# Patient Record
Sex: Male | Born: 1955
Health system: Southern US, Community
[De-identification: ages and names within clinical notes are randomized; demographics above are authoritative.]

## PROBLEM LIST (undated history)

## (undated) DIAGNOSIS — N200 Calculus of kidney: Secondary | ICD-10-CM

## (undated) DIAGNOSIS — E785 Hyperlipidemia, unspecified: Secondary | ICD-10-CM

## (undated) DIAGNOSIS — Q625 Duplication of ureter: Secondary | ICD-10-CM

## (undated) DIAGNOSIS — K219 Gastro-esophageal reflux disease without esophagitis: Secondary | ICD-10-CM

## (undated) DIAGNOSIS — K5520 Angiodysplasia of colon without hemorrhage: Secondary | ICD-10-CM

## (undated) DIAGNOSIS — I219 Acute myocardial infarction, unspecified: Secondary | ICD-10-CM

## (undated) DIAGNOSIS — I10 Essential (primary) hypertension: Secondary | ICD-10-CM

## (undated) DIAGNOSIS — I2510 Atherosclerotic heart disease of native coronary artery without angina pectoris: Secondary | ICD-10-CM

## (undated) DIAGNOSIS — M519 Unspecified thoracic, thoracolumbar and lumbosacral intervertebral disc disorder: Secondary | ICD-10-CM

## (undated) DIAGNOSIS — C801 Malignant (primary) neoplasm, unspecified: Secondary | ICD-10-CM

## (undated) DIAGNOSIS — Z86010 Personal history of colonic polyps: Secondary | ICD-10-CM

## (undated) HISTORY — PX: ANGIOPLASTY: SHX39

## (undated) HISTORY — DX: Hyperlipidemia, unspecified: E78.5

## (undated) HISTORY — DX: Atherosclerotic heart disease of native coronary artery without angina pectoris: I25.10

## (undated) HISTORY — PX: KNEE ARTHROSCOPY: SUR90

## (undated) HISTORY — DX: Personal history of colonic polyps: Z86.010

## (undated) HISTORY — DX: Malignant (primary) neoplasm, unspecified: C80.1

## (undated) HISTORY — PX: BACK SURGERY: SHX140

## (undated) HISTORY — DX: Angiodysplasia of colon without hemorrhage: K55.20

## (undated) HISTORY — DX: Essential (primary) hypertension: I10

## (undated) HISTORY — DX: Gastro-esophageal reflux disease without esophagitis: K21.9

## (undated) HISTORY — DX: Calculus of kidney: N20.0

## (undated) HISTORY — DX: Duplication of ureter: Q62.5

## (undated) HISTORY — DX: Acute myocardial infarction, unspecified: I21.9

## (undated) HISTORY — PX: HAND NERVE REPAIR: SHX1728

## (undated) HISTORY — DX: Unspecified thoracic, thoracolumbar and lumbosacral intervertebral disc disorder: M51.9

---
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
## 1991-02-26 HISTORY — PX: LUMBAR DISC SURGERY: SHX700

## 2004-05-28 ENCOUNTER — Emergency Department: Payer: Self-pay | Admitting: General Practice

## 2006-02-05 ENCOUNTER — Ambulatory Visit: Payer: Self-pay | Admitting: Podiatry

## 2007-06-24 HISTORY — PX: DOPPLER ECHOCARDIOGRAPHY: SHX263

## 2010-04-25 ENCOUNTER — Ambulatory Visit: Payer: Self-pay | Admitting: Vascular Surgery

## 2010-04-28 ENCOUNTER — Observation Stay: Payer: Self-pay | Admitting: Vascular Surgery

## 2011-05-10 ENCOUNTER — Ambulatory Visit: Payer: Self-pay | Admitting: Family Medicine

## 2011-10-02 ENCOUNTER — Ambulatory Visit: Payer: Self-pay | Admitting: Internal Medicine

## 2011-10-10 ENCOUNTER — Encounter: Payer: Self-pay | Admitting: Internal Medicine

## 2011-10-10 ENCOUNTER — Ambulatory Visit (INDEPENDENT_AMBULATORY_CARE_PROVIDER_SITE_OTHER): Payer: 59 | Admitting: Internal Medicine

## 2011-10-10 VITALS — BP 128/80 | HR 63 | Temp 98.1°F | Ht 72.0 in | Wt 189.0 lb

## 2011-10-10 DIAGNOSIS — E785 Hyperlipidemia, unspecified: Secondary | ICD-10-CM | POA: Insufficient documentation

## 2011-10-10 DIAGNOSIS — M519 Unspecified thoracic, thoracolumbar and lumbosacral intervertebral disc disorder: Secondary | ICD-10-CM | POA: Insufficient documentation

## 2011-10-10 DIAGNOSIS — Z23 Encounter for immunization: Secondary | ICD-10-CM

## 2011-10-10 DIAGNOSIS — I10 Essential (primary) hypertension: Secondary | ICD-10-CM | POA: Insufficient documentation

## 2011-10-10 DIAGNOSIS — K219 Gastro-esophageal reflux disease without esophagitis: Secondary | ICD-10-CM | POA: Insufficient documentation

## 2011-10-10 NOTE — Assessment & Plan Note (Signed)
Mild and does fine with occ OTC prevacid
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
## 2011-10-10 NOTE — Assessment & Plan Note (Signed)
BP Readings from Last 3 Encounters:  10/10/11 128/80   Pretty good now Not sure he needs the low dose meds Will review meds and discuss next time Discussed fitness

## 2011-10-10 NOTE — Assessment & Plan Note (Signed)
He is not interested in primary prevention i will check records and recheck labs today

## 2011-10-10 NOTE — Patient Instructions (Addendum)
Please get last 3 years notes from [facility]

## 2011-10-10 NOTE — Assessment & Plan Note (Signed)
Mild residual nerve damage in left foot Dorsiflexion is normal though Rarely uses advil

## 2011-10-12 LAB — CBC WITH DIFFERENTIAL/PLATELET
Eos: 2 % (ref 0–7)
Eosinophils Absolute: 0.2 10*3/uL (ref 0.0–0.4)
Immature Grans (Abs): 0 10*3/uL (ref 0.0–0.1)
Immature Granulocytes: 0 % (ref 0–2)
MCH: 31.4 pg (ref 26.6–33.0)
MCV: 90 fL (ref 79–97)
Monocytes Absolute: 0.4 10*3/uL (ref 0.1–1.0)
Neutrophils Relative %: 53 % (ref 40–74)
RDW: 14.5 % (ref 12.3–15.4)
WBC: 7.7 10*3/uL (ref 4.0–10.5)

## 2011-10-12 LAB — BASIC METABOLIC PANEL
Chloride: 102 mmol/L (ref 97–108)
GFR calc Af Amer: 98 mL/min/{1.73_m2} (ref >59)
Glucose: 85 mg/dL (ref 65–99)
Potassium: 4.5 mmol/L (ref 3.5–5.2)

## 2011-10-12 LAB — LIPID PANEL
HDL: 39 mg/dL — ABNORMAL LOW (ref >39)
LDL Calculated: 146 mg/dL — ABNORMAL HIGH (ref 0–99)
VLDL Cholesterol Cal: 37 mg/dL (ref 5–40)

## 2011-10-12 LAB — HEPATIC FUNCTION PANEL: ALT: 31 IU/L (ref 0–44)

## 2011-10-12 LAB — TSH: TSH: 2.82 u[IU]/mL (ref 0.450–4.500)

## 2011-10-15 ENCOUNTER — Encounter: Payer: Self-pay | Admitting: *Deleted

## 2011-11-08 ENCOUNTER — Encounter: Payer: Self-pay | Admitting: Internal Medicine

## 2012-03-02 ENCOUNTER — Emergency Department: Payer: Self-pay | Admitting: Emergency Medicine

## 2012-03-02 LAB — CBC
HGB: 15.4 g/dL (ref 13.0–18.0)
MCH: 31.2 pg (ref 26.0–34.0)
MCHC: 34 g/dL (ref 32.0–36.0)
MCV: 92 fL (ref 80–100)

## 2012-03-02 LAB — COMPREHENSIVE METABOLIC PANEL
Albumin: 3.9 g/dL (ref 3.4–5.0)
Alkaline Phosphatase: 93 U/L (ref 50–136)
Anion Gap: 6 — ABNORMAL LOW (ref 7–16)
BUN: 13 mg/dL (ref 7–18)
Calcium, Total: 8.4 mg/dL — ABNORMAL LOW (ref 8.5–10.1)
Co2: 27 mmol/L (ref 21–32)
Creatinine: 0.89 mg/dL (ref 0.60–1.30)
EGFR (African American): >60
EGFR (Non-African Amer.): >60
Glucose: 87 mg/dL (ref 65–99)
Osmolality: 279 (ref 275–301)
SGPT (ALT): 39 U/L (ref 12–78)
Sodium: 140 mmol/L (ref 136–145)

## 2012-03-02 LAB — CK TOTAL AND CKMB (NOT AT ARMC): CK, Total: 138 U/L (ref 35–232)

## 2012-03-03 ENCOUNTER — Telehealth: Payer: Self-pay | Admitting: Internal Medicine

## 2012-03-03 ENCOUNTER — Encounter: Payer: Self-pay | Admitting: Family Medicine

## 2012-03-03 ENCOUNTER — Ambulatory Visit (INDEPENDENT_AMBULATORY_CARE_PROVIDER_SITE_OTHER): Payer: 59 | Admitting: Family Medicine

## 2012-03-03 VITALS — BP 146/74 | HR 77 | Temp 97.5°F | Ht 72.0 in | Wt 189.5 lb

## 2012-03-03 DIAGNOSIS — E785 Hyperlipidemia, unspecified: Secondary | ICD-10-CM

## 2012-03-03 DIAGNOSIS — R079 Chest pain, unspecified: Secondary | ICD-10-CM | POA: Insufficient documentation

## 2012-03-03 NOTE — Progress Notes (Signed)
Subjective:    Patient ID: Justin Terrell, male    DOB: September 16, 1955, 57 y.o.   MRN: [id number]  HPI Here for cp  Having "uneasiness" and pain in L chest over the weekend Took some asa sat-helped  Yesterday started again at 5 pm  Went to ER ([facility])- and did EKG and cxr and labs - waited too long and left without results  Chest burns with cold air  Chest is sore to the touch also and hurts to lift up his arms   Is at rest and during exertion Pain rad to his armpit L Was chilled on sat  Not sob  No nausea  Not sweating   Has a lot of stress from work- works with [employer]  Works a lot of hours  Wife had cancer this year - is doing ok now   He has also coughed a bit - and his wife has had a cold  Pulse 96% Pulse rate is nl at 77 Unsure if he is coming down with his wife's cold   No heartburn or indigestion or stomach pain    Today's EKG - NSR with rate of 66, one PAC and no acute changes  fam hx - heart valve dz in father  Personal hx of lipid/ HTN Lab Results  Component Value Date   HDL 39* 10/10/2011   LDLCALC 146* 10/10/2011   TRIG 185* 10/10/2011   CHOLHDL 5.7* 10/10/2011   intol of pravastatin Has not tried other medicines    BP: 146/74 mmHg   On lisinopril  Had nl stress echo in 09- was ok   He does have PVD in L leg - angioplasty last year - in [place]  He was unhappy with his care there  Has not had follow up for that  Patient Active Problem List  Diagnosis  . Hypertension  . Hyperlipidemia  . Lumbar disc disease  . GERD (gastroesophageal reflux disease)  . Chest pain   Past Medical History  Diagnosis Date  . Hypertension   . Hyperlipidemia   . Lumbar disc disease   . GERD (gastroesophageal reflux disease)     aspirin sensitive, prn meds only   Past Surgical History  Procedure Date  . Lumbar disc surgery 1993  . Right leg compound fracture repair 1976  . Hand nerve repair 1970's    both hands  . Knee arthroscopy 1990's    right   . Doppler  echocardiography 06/24/07    stress echo normal. Nl baseline EF   History  Substance Use Topics  . Smoking status: Former Smoker    Quit date: 02/25/2009  . Smokeless tobacco: Never Used  . Alcohol Use: No   Family History  Problem Relation Age of Onset  . Heart disease Father   . Hypertension Father   . Diabetes Neg Hx   . Cancer Neg Hx    Allergies  Allergen Reactions  . Codeine Nausea And Vomiting  . Pravastatin     Had chest pain   Current Outpatient Prescriptions on File Prior to Visit  Medication Sig Dispense Refill  . aspirin 81 MG tablet Take 81 mg by mouth daily.        Review of Systems Review of Systems  Constitutional: Negative for fever, appetite change, fatigue and unexpected weight change.  Eyes: Negative for pain and visual disturbance.  Respiratory: Negative for cough and shortness of breath.   Cardiovascular: Negative for  Palpitations, pnd, orthopnea , exertional sob    Gastrointestinal:  Negative for nausea, diarrhea and constipation. neg for abd pain or heartburn Genitourinary: Negative for urgency and frequency.  Skin: Negative for pallor or rash   MSK pos for soreness over chest worse with raising his arm, neg for back pain  Neurological: Negative for weakness, light-headedness, numbness and headaches.  Hematological: Negative for adenopathy. Does not bruise/bleed easily.  Psychiatric/Behavioral: Negative for dysphoric mood. The patient is not nervous/anxious.  has stressors        Objective:   Physical Exam  Constitutional: He appears well-developed and well-nourished. No distress.  HENT:  Head: Normocephalic and atraumatic.  Mouth/Throat: Oropharynx is clear and moist.  Eyes: Conjunctivae normal and EOM are normal. Pupils are equal, round, and reactive to light. Right eye exhibits no discharge. Left eye exhibits no discharge.  Neck: Normal range of motion. Neck supple. No JVD present. Carotid bruit is not present. No thyromegaly present.    Cardiovascular: Normal rate and regular rhythm.   Pulmonary/Chest: Effort normal and breath sounds normal. No respiratory distress. He has no wheezes. He exhibits tenderness.       Anterolateral L chest/ rib tenderness without crepitus or skin change  Abdominal: Soft. Bowel sounds are normal. He exhibits no distension, no abdominal bruit and no mass. There is no tenderness.  Musculoskeletal: He exhibits no edema and no tenderness.       No leg swelling/ heat or redness Neg homann's sign  Lymphadenopathy:    He has no cervical adenopathy.  Neurological: He is alert. He has normal reflexes. No cranial nerve deficit. He exhibits normal muscle tone. Coordination normal.  Skin: Skin is warm and dry. No rash noted. No erythema. No pallor.  Psychiatric: His affect is blunt.       Pt acts and responds to questions normally but has a very flat affect           Assessment & Plan:

## 2012-03-03 NOTE — Patient Instructions (Addendum)
Please send for [facility] ER notes and labs/ studies from yesterday/ last night - pt left the ER without finishing eval  Please put warm compress over the uncomfortable areas for 10 minutes on and off Also try aleve 1-2 pills with food up to every 12 hours  I will review records and we will get back to you  If symptoms change or suddenly worsen please call and go to the ER

## 2012-03-03 NOTE — Assessment & Plan Note (Signed)
Seems MSK on exam- with ant/lat rib tenderness on L - worse to abduct arm  Disc poss of muscle strain or costochondritis Disc symptomatic care - see instructions on AVS  Sent for records from ER to rev- since he left [facility] abruptly  Reassuring vitals/ exam and EKG today

## 2012-03-03 NOTE — Telephone Encounter (Signed)
Pt was seen

## 2012-03-03 NOTE — Assessment & Plan Note (Signed)
Pt states he had angioplasty of ? Leg last year in [place] but does not have known coronary dz Will bring up cholesterol control again with his PCP in feb

## 2012-03-03 NOTE — Telephone Encounter (Signed)
Pt on your scheduled for 11:45 today

## 2012-03-03 NOTE — Telephone Encounter (Signed)
Patient Information:  Caller Name: Lyan  Phone: [phone]  Patient: Justin Terrell, Justin Terrell  Gender: Male  DOB: 1955/11/20  Age: 57 Years  PCP: Tillman Abide [facility])  Office Follow Up:  Does the office need to follow up with this patient?: No  Instructions For The Office: N/A  RN Note:  Currently has no chest pain but is feeling uneasy.  During episodes this weekend did has some shortness of breath with some momentary dizziness.  Took 2 regular ASA during these times with pain resolving.  Went last night to [facility] where blood work and EKG was performed.  Patient left after 1.5 hours of waiting  Wife has had a cold.  Feels like left lung is burning. Has had a random cough with runny nose that is clear.  Symptoms  Reason For Call & Symptoms: Chest pain.  Had episodes on Saturday, Sunday and twice yesterday.  Went to ER and got tired waiting and left.  Reviewed Health History In EMR: Yes  Reviewed Medications In EMR: Yes  Reviewed Allergies In EMR: Yes  Reviewed Surgeries / Procedures: Yes  Date of Onset of Symptoms: 02/29/2012  Guideline(s) Used:  Chest Pain  Disposition Per Guideline:   See Today in Office  Reason For Disposition Reached:   Intermittent chest pains persist > 3 days  Advice Given:  Call Back If:  You become worse.  Appointment Scheduled:  03/03/2012 11:45:00 Appointment Scheduled Provider:  Roxy Manns (Family Practice)-Patient's PCP was full.

## 2012-03-31 ENCOUNTER — Telehealth: Payer: Self-pay | Admitting: Internal Medicine

## 2012-03-31 MED ORDER — LISINOPRIL 10 MG PO TABS: 10.0000 mg | ORAL_TABLET | Freq: Every day | ORAL | Status: DC

## 2012-03-31 NOTE — Telephone Encounter (Signed)
rx sent to pharmacy by e-script Left message on VM that rx was sent to pharmacy 

## 2012-03-31 NOTE — Telephone Encounter (Signed)
Pt sent an E-mail through the website: Justin Terrell is almost out of his blood pressure medicine -Lisinopril 10 mg. He has an apt with Dr Alphonsus Sias 04/23/12. Can he get a refill please @ [facility] on [address]? [phone] or E-mail [name]

## 2012-04-23 ENCOUNTER — Ambulatory Visit (INDEPENDENT_AMBULATORY_CARE_PROVIDER_SITE_OTHER): Payer: 59 | Admitting: Internal Medicine

## 2012-04-23 ENCOUNTER — Encounter: Payer: Self-pay | Admitting: Internal Medicine

## 2012-04-23 VITALS — BP 130/62 | HR 72 | Temp 98.1°F | Ht 72.0 in | Wt 189.0 lb

## 2012-04-23 DIAGNOSIS — Z1211 Encounter for screening for malignant neoplasm of colon: Secondary | ICD-10-CM

## 2012-04-23 DIAGNOSIS — E785 Hyperlipidemia, unspecified: Secondary | ICD-10-CM

## 2012-04-23 DIAGNOSIS — I10 Essential (primary) hypertension: Secondary | ICD-10-CM

## 2012-04-23 DIAGNOSIS — Z0000 Encounter for general adult medical examination without abnormal findings: Secondary | ICD-10-CM | POA: Insufficient documentation

## 2012-04-23 MED ORDER — LISINOPRIL 10 MG PO TABS: 10.0000 mg | ORAL_TABLET | Freq: Every day | ORAL | Status: DC

## 2012-04-23 NOTE — Assessment & Plan Note (Signed)
Doesn't want another try with meds at this point

## 2012-04-23 NOTE — Progress Notes (Signed)
Subjective:    Patient ID: Justin Terrell, male    DOB: 02-18-1956, 57 y.o.   MRN: [id number]  HPI Here for physical Chest pain went away Has had a couple of spells---gets tightness in mid chest--will gasp for air and it stops May occur lying down, not with exertion Thinks it may be stress from working a lot Hopes to take vacation soon  Has not been sleeping well Doesn't use any meds for this--melatonin not much help Tries to walk--not that often in cold weather. Daily in summer  Has noted more knee, ankle, etc aching Especially in the cold  No other concerns  Current Outpatient Prescriptions on File Prior to Visit  Medication Sig Dispense Refill  . aspirin 81 MG tablet Take 81 mg by mouth daily.       No current facility-administered medications on file prior to visit.    Allergies  Allergen Reactions  . Codeine Nausea And Vomiting  . Pravastatin     Had chest pain    Past Medical History  Diagnosis Date  . Hypertension   . Hyperlipidemia   . Lumbar disc disease   . GERD (gastroesophageal reflux disease)     aspirin sensitive, prn meds only    Past Surgical History  Procedure Laterality Date  . Lumbar disc surgery  1993  . Right leg compound fracture repair  1976  . Hand nerve repair  1970's    both hands  . Knee arthroscopy  1990's    right   . Doppler echocardiography  06/24/07    stress echo normal. Nl baseline EF    Family History  Problem Relation Age of Onset  . Heart disease Father   . Hypertension Father   . Diabetes Neg Hx   . Cancer Neg Hx     History   Social History  . Marital Status: Married    Spouse Name: N/A    Number of Children: 0  . 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 Smoking status: Former Smoker    Quit date: 02/25/2009  . Smokeless tobacco: Never Used  . Alcohol Use: No  . Drug Use: No  . Sexually Active: Not on file   Other Topics Concern   . Not on file   Social History Narrative   2nd marriage   Wife has 2 children (1 died)   Review of Systems  Constitutional: Negative for fatigue and unexpected weight change.       Wears seat belt  HENT: Negative for hearing loss, congestion, rhinorrhea, dental problem and tinnitus.        Due for the dentist  Eyes: Negative for visual disturbance.       No diplopia or unilateral vision loss  Respiratory: Negative for cough, chest tightness and shortness of breath.   Cardiovascular: Positive for chest pain. Negative for palpitations and leg swelling.  Gastrointestinal: Positive for nausea. Negative for vomiting, abdominal pain, constipation and blood in stool.       Episodic nausea with head pressure---brief No heartburn  Endocrine: Negative for cold intolerance and heat intolerance.  Genitourinary: Negative for urgency, frequency and difficulty urinating.       No sexual problems  Musculoskeletal: Positive for back pain and arthralgias. Negative for joint swelling.       Some varied aching Back pain after shovelling  Skin: Positive for rash.       Occ  head pimples--acne Bump on right occiput  Allergic/Immunologic: Negative for environmental allergies and immunocompromised state.  Neurological: Positive for headaches. Negative for dizziness, syncope, weakness, light-headedness and numbness.  Hematological: Positive for adenopathy. Does not bruise/bleed easily.       ?some right cervical nodes---generally come and go  Psychiatric/Behavioral: Positive for sleep disturbance. Negative for dysphoric mood. The patient is not nervous/anxious.        Objective:   Physical Exam  Constitutional: He is oriented to person, place, and time. He appears well-developed and well-nourished. No distress.  HENT:  Head: Normocephalic and atraumatic.  Right Ear: External ear normal.  Left Ear: External ear normal.  Mouth/Throat: Oropharynx is clear and moist.  Eyes: Conjunctivae and EOM are  normal. Pupils are equal, round, and reactive to light.  Neck: Normal range of motion. Neck supple. No thyromegaly present.  Cardiovascular: Normal rate, regular rhythm, normal heart sounds and intact distal pulses.  Exam reveals no gallop.   No murmur heard. Pulmonary/Chest: Effort normal and breath sounds normal. No respiratory distress. He has no wheezes. He has no rales.  Abdominal: Soft. Bowel sounds are normal. There is no tenderness.  Musculoskeletal: He exhibits no edema and no tenderness.  Lymphadenopathy:    He has no cervical adenopathy.  Neurological: He is alert and oriented to person, place, and time.  Skin:  Slight red area on right occiput without true lesion  Psychiatric: He has a normal mood and affect. His behavior is normal.          Assessment & Plan:

## 2012-04-23 NOTE — Assessment & Plan Note (Signed)
Fairly healthy but needs to do more exercise Will check PSA after discussion  Stool immunoassay

## 2012-04-23 NOTE — Assessment & Plan Note (Signed)
BP Readings from Last 3 Encounters:  04/23/12 130/62  03/03/12 146/74  10/10/11 128/80   Good control No changes needed

## 2012-04-24 LAB — CBC WITH DIFFERENTIAL/PLATELET
Basophils Absolute: 0 10*3/uL (ref 0.0–0.2)
Basos: 0 % (ref 0–3)
Eosinophils Absolute: 0.1 10*3/uL (ref 0.0–0.4)
HCT: 46.5 % (ref 37.5–51.0)
Hemoglobin: 15.6 g/dL (ref 12.6–17.7)
Lymphocytes Absolute: 2.3 10*3/uL (ref 0.7–3.1)
MCH: 31.3 pg (ref 26.6–33.0)
MCHC: 33.5 g/dL (ref 31.5–35.7)
MCV: 93 fL (ref 79–97)
Neutrophils Absolute: 5.5 10*3/uL (ref 1.4–7.0)
RBC: 4.98 x10E6/uL (ref 4.14–5.80)

## 2012-04-24 LAB — LIPID PANEL
HDL: 33 mg/dL — ABNORMAL LOW (ref >39)
Triglycerides: 260 mg/dL — ABNORMAL HIGH (ref 0–149)
VLDL Cholesterol Cal: 52 mg/dL — ABNORMAL HIGH (ref 5–40)

## 2012-04-24 LAB — BASIC METABOLIC PANEL
BUN/Creatinine Ratio: 15 (ref 9–20)
BUN: 14 mg/dL (ref 6–24)
Creatinine, Ser: 0.93 mg/dL (ref 0.76–1.27)
GFR calc Af Amer: 106 mL/min/{1.73_m2} (ref >59)
GFR calc non Af Amer: 91 mL/min/{1.73_m2} (ref >59)
Sodium: 141 mmol/L (ref 134–144)

## 2012-04-24 LAB — TSH: TSH: 1.47 u[IU]/mL (ref 0.450–4.500)

## 2012-04-24 LAB — HEPATIC FUNCTION PANEL
AST: 14 IU/L (ref 0–40)
Albumin: 4.3 g/dL (ref 3.5–5.5)

## 2012-04-24 LAB — PSA: PSA: 1.9 ng/mL (ref 0.0–4.0)

## 2012-04-29 ENCOUNTER — Other Ambulatory Visit (INDEPENDENT_AMBULATORY_CARE_PROVIDER_SITE_OTHER): Payer: 59

## 2012-04-29 DIAGNOSIS — Z1211 Encounter for screening for malignant neoplasm of colon: Secondary | ICD-10-CM

## 2012-04-30 ENCOUNTER — Encounter: Payer: Self-pay | Admitting: *Deleted

## 2012-04-30 LAB — FECAL OCCULT BLOOD, IMMUNOCHEMICAL: Fecal Occult Bld: NEGATIVE

## 2012-05-05 ENCOUNTER — Encounter: Payer: Self-pay | Admitting: *Deleted

## 2012-09-11 ENCOUNTER — Emergency Department: Payer: Self-pay | Admitting: Emergency Medicine

## 2012-09-11 LAB — BASIC METABOLIC PANEL
Chloride: 107 mmol/L (ref 98–107)
Co2: 28 mmol/L (ref 21–32)
Creatinine: 0.95 mg/dL (ref 0.60–1.30)
EGFR (Non-African Amer.): >60
Glucose: 102 mg/dL — ABNORMAL HIGH (ref 65–99)
Osmolality: 278 (ref 275–301)

## 2012-09-11 LAB — URINALYSIS, COMPLETE
Bilirubin,UR: NEGATIVE
Ketone: NEGATIVE
Leukocyte Esterase: NEGATIVE
Nitrite: NEGATIVE
Ph: 6 (ref 4.5–8.0)
Protein: NEGATIVE
RBC,UR: 1 /HPF (ref 0–5)
WBC UR: <1 /HPF (ref 0–5)

## 2012-09-11 LAB — HEPATIC FUNCTION PANEL A (ARMC)
Albumin: 3.9 g/dL (ref 3.4–5.0)
Bilirubin, Direct: <0.05 mg/dL (ref 0.00–0.20)
Bilirubin,Total: 0.5 mg/dL (ref 0.2–1.0)
SGPT (ALT): 33 U/L (ref 12–78)

## 2012-09-11 LAB — CBC
HGB: 15.6 g/dL (ref 13.0–18.0)
MCH: 30.8 pg (ref 26.0–34.0)
MCHC: 34 g/dL (ref 32.0–36.0)
MCV: 90 fL (ref 80–100)
RDW: 14.5 % (ref 11.5–14.5)

## 2012-09-28 ENCOUNTER — Encounter: Payer: Self-pay | Admitting: Family Medicine

## 2012-09-28 ENCOUNTER — Ambulatory Visit (INDEPENDENT_AMBULATORY_CARE_PROVIDER_SITE_OTHER): Payer: 59 | Admitting: Family Medicine

## 2012-09-28 VITALS — BP 118/78 | HR 86 | Temp 98.4°F | Wt 190.5 lb

## 2012-09-28 DIAGNOSIS — J019 Acute sinusitis, unspecified: Secondary | ICD-10-CM | POA: Insufficient documentation

## 2012-09-28 MED ORDER — HYDROCODONE-HOMATROPINE 5-1.5 MG/5ML PO SYRP: 5.0000 mL | ORAL_SOLUTION | Freq: Three times a day (TID) | ORAL | Status: DC | PRN

## 2012-09-28 MED ORDER — AMOXICILLIN-POT CLAVULANATE 875-125 MG PO TABS: 1.0000 | ORAL_TABLET | Freq: Two times a day (BID) | ORAL | Status: DC

## 2012-09-28 NOTE — Assessment & Plan Note (Signed)
Presumed, nontoxic, d/w pt re: options. Would use hycodan tonight and if not improving use augmentin, WASP rx in meantime.  He agrees.  Okay for outpatient f/u.

## 2012-09-28 NOTE — Patient Instructions (Addendum)
Use the cough medicine tonight and start the antibiotics in a few days if not improved.   Drink plenty of fluids and try to get some rest.  This should gradually improve.  Take care.  Let us know if you have other concerns.

## 2012-09-28 NOTE — Progress Notes (Signed)
5 days of sx.  Waking coughing, not sleeping. Tessalon isn't helping.  OTC meds aren't helping.  ST.  Thicker sputum in meantime. Ear ringing and facial pressure.  Seen at [facility]. Not improved in meantime. Felt hot, no documented fevers.   Meds, vitals, and allergies reviewed.   ROS: See HPI.  Otherwise, noncontributory.  GEN: nad, alert and oriented HEENT: mucous membranes moist, tm w/o erythema, nasal exam w/o erythema, clear discharge noted,  OP with cobblestoning, max sinus ttp x4 NECK: supple w/o LA CV: rrr.   PULM: ctab, no inc wob EXT: no edema SKIN: no acute rash

## 2012-10-08 ENCOUNTER — Ambulatory Visit (INDEPENDENT_AMBULATORY_CARE_PROVIDER_SITE_OTHER): Payer: 59 | Admitting: Family Medicine

## 2012-10-08 ENCOUNTER — Encounter: Payer: Self-pay | Admitting: Family Medicine

## 2012-10-08 VITALS — BP 128/84 | HR 68 | Temp 98.0°F | Wt 190.5 lb

## 2012-10-08 DIAGNOSIS — B370 Candidal stomatitis: Secondary | ICD-10-CM

## 2012-10-08 DIAGNOSIS — J019 Acute sinusitis, unspecified: Secondary | ICD-10-CM

## 2012-10-08 MED ORDER — NYSTATIN 100000 UNIT/ML MT SUSP: 500000.0000 [IU] | Freq: Four times a day (QID) | OROMUCOSAL | Status: DC

## 2012-10-08 NOTE — Progress Notes (Signed)
  Subjective:    Patient ID: Justin Terrell, male    DOB: [birth year], 57 y.o.   MRN: [id number]  HPI CC: white spots.  Seen here 09/28/2012 with dx sinusitis.  Treated with hycodan cough syrup and augmentin course.  6 days into abx, started noticing white spots on tongue and sore throat, hoarseness.    Stopped abx.  R swollen gland.  Has also noted fever blisters, used carmex salve which has helped.  Coughing up white mucous.  Dry mouth, started biotene but hasn't really helped.    Cough has continued.  Cough syrup does help some.  No fevers/chills, wheezing, chest pain, ear or tooth pain,   Wife starting to get sick as well. No smokers at home. No h/o asthma, COPD.  H/o GERD, takes PPI prn.  Past Medical History  Diagnosis Date  . Hypertension   . Hyperlipidemia   . Lumbar disc disease   . GERD (gastroesophageal reflux disease)     aspirin sensitive, prn meds only     Review of Systems Per HPI    Objective:   Physical Exam  Nursing note and vitals reviewed. Constitutional: He appears well-developed and well-nourished. No distress.  HENT:  Head: Normocephalic and atraumatic.  Right Ear: Hearing, tympanic membrane, external ear and ear canal normal.  Left Ear: Hearing, tympanic membrane, external ear and ear canal normal.  Nose: Nose normal. No mucosal edema or rhinorrhea. Right sinus exhibits no maxillary sinus tenderness and no frontal sinus tenderness. Left sinus exhibits no maxillary sinus tenderness and no frontal sinus tenderness.  Mouth/Throat: Uvula is midline and mucous membranes are normal. Posterior oropharyngeal erythema present. No oropharyngeal exudate, posterior oropharyngeal edema or tonsillar abscesses.  Light white patches on lateral tongue, tender with scraping Papules on mid tongue  Eyes: Conjunctivae and EOM are normal. Pupils are equal, round, and reactive to light. No scleral icterus.  Neck: Normal range of motion. Neck supple.  Cardiovascular: Normal  rate, regular rhythm, normal heart sounds and intact distal pulses.   No murmur heard. Pulmonary/Chest: Effort normal and breath sounds normal. No respiratory distress. He has no wheezes. He has no rales.  RLL crackles, clear with deep cough  Lymphadenopathy:    He has cervical adenopathy (R AC LAD).  Skin: Skin is warm and dry. No rash noted.       Assessment & Plan:

## 2012-10-08 NOTE — Patient Instructions (Signed)
I do think you have thrush - treat with nystatin swish and swallow 3-4 times daily. Let us know if not improving as expected.  Thrush, Adult  Justin Terrell is a yeast infection that develops in the mouth and throat and on the tongue. The medical term for this is oropharyngeal candidiasis, or OPC. Justin Terrell is most common in older adults, but it can occur at any age. Justin Terrell occurs when a yeast called candida grows out of control. Candida normally is present in small amounts in the mouth and on other mucous membranes. However, under certain circumstances, candida can grow rapidly, causing thrush. Justin Terrell can be a recurring problem for people who have chronic illnesses or who take medications that limit the body's ability to fight infection (weakened immune system). Since these people have difficulty fighting infections, the fungus that causes thrush can spread throughout the body. This can cause life-threatening blood or organ infections. CAUSES  Candida, the yeast that causes thrush, is normally present in small amounts in the mouth and on other mucous membranes. It usually causes no harm. However, when conditions are present that allow the yeast to grow uncontrolled, it invades surrounding tissues and becomes an infection. Justin Terrell is most commonly caused by the yeast Candida albicans. Less often, other forms of candida can lead to thrush. There are many types of bacteria in your mouth that normally control the growth of candida. Sometimes a new type of bacteria gets into your mouth and disrupts the balance of the germs already there. This can allow candida to overgrow. Other factors that increase your risk of developing thrush include:  An impaired ability to fight infection (weakened immune system). A normal immune system is usually strong enough to prevent candida from overgrowing.  Older adults are more likely to develop thrush because they may have weaker immune systems.  People with human immunodeficiency  virus (HIV) infection have a high likelihood of developing thrush. About 90% of people with HIV develop thrush at some point during the course of their disease.  People with diabetes are more likely to get thrush because high blood sugar levels promote overgrowth of the candida fungus.  A dry mouth (xerostomia). Dry mouth can result from overuse of mouthwashes or from certain conditions such as Sjgren's syndrome.  Pregnancy. Hormone changes during pregnancy can lead to thrush by altering the balance of bacteria in the mouth.  Poor dental care, especially in people who have false teeth.  The use of antibiotic medications. This may lead to thrush by changing the balance of bacteria in the mouth. SYMPTOMS  Thrush can be a mild infection that causes no symptoms. If symptoms develop, they may include the following:  A burning feeling in the mouth and throat. This can occur at the start of a thrush infection.  White patches that adhere to the mouth and tongue. The tissue around the patches may be red, raw, and painful. If rubbed (during tooth brushing, for example), the patches and the tissue of the mouth may bleed easily.  A bad taste in the mouth or difficulty tasting foods.  Cottony feeling in the mouth.  Sometimes pain during eating and swallowing. DIAGNOSIS  Your caregiver can usually diagnose thrush by exam. In addition to looking in your mouth, your caregiver will ask you questions about your health. TREATMENT  Medications that help prevent the growth of fungi (antifungals) are the standard treatment for thrush. These medications are either applied directly to the affected area (topical) or swallowed (oral). Mild thrush In  adults, mild cases of thrush may clear up with simple treatment that can be done at home. This treatment usually involves using an antifungal mouth rinse or lozenges. Treatment usually lasts about 14 days. Moderate to severe thrush  More severe thrush infections  that have spread to the esophagus are treated with an oral antifungal medication. A topical antifungal medication may also be used.  For some severe infections, a treatment period longer than 14 days may be needed.  Oral antifungal medications are almost never used during pregnancy because the fetus may be harmed. However, if a pregnant woman has a rare, severe thrush infection that has spread to her blood, oral antifungal medications may be used. In this case, the risk of harm to the mother and fetus from the severe thrush infection may be greater than the risk posed by the use of antifungal medications. Persistent or recurrent thrush Persistent (does not go away) or recurrent (keeps coming back) cases of thrush may:  Need to be treated twice as long as the symptoms last.  Require treatment with both oral and topical antifungal medications.  People with weakened immune systems can take an antifungal medication on a continuous basis to prevent thrush infections. It is important to treat conditions that make you more likely to get thrush, such as diabetes, human immunodeficiency virus (HIV), or cancer.  HOME CARE INSTRUCTIONS   If you are breast-feeding, you should clean your nipples with an antifungal medication, such as nystatin (Mycostatin). Dry your nipples after breast-feeding. Applying lanolin-containing body lotion may help relieve nipple soreness.  If you wear dentures and get thrush, remove dentures before going to bed, brush them vigorously, and soak in a solution of chlorhexidine gluconate or a product such as Polident or Efferdent.  Eating plain, unflavored yogurt that contains live cultures (check the label) can also help cure thrush. Yogurt helps healthy bacteria grow in the mouth. These bacteria stop the growth of the yeast that causes thrush.  Adults can treat thrush at home with gentian violet (1%), a dye that kills bacteria and fungi. It is available without a prescription.  If there is no known cause for the infection or if gentian violet does not cure the thrush, you need to see your caregiver. Comfort measures Measures can be taken to reduce the discomfort of thrush:  Drink cold liquids such as water or iced tea. Eat flavored ice treats or frozen juices.  Eat foods that are easy to swallow such as gelatin, ice cream, or custard.  If the patches are painful, try drinking from a straw.  Rinse your mouth several times a day with a warm saltwater rinse. You can make the saltwater mixture with 1 tsp (5 g) of salt in 8 fl oz (0.2 L) of warm water. PROGNOSIS   Most cases of thrush are mild and clear up with the use of an antifungal mouth rinse or lozenges. Very mild cases of thrush may clear up without medical treatment. It usually takes about 14 days of treatment with an oral antifungal medication to cure more severe thrush infections. In some cases, thrush may last several weeks even with treatment.  If thrush goes untreated and does not go away by itself, it can spread to other parts of the body.  Thrush can spread to the throat, the vagina, or the skin. It rarely spreads to other organs of the body. Justin Terrell is more likely to recur (come back) in:  People who use inhaled corticosteroids to treat asthma.  People who take antibiotic medications for a long time.  People who have false teeth.  People who have a weakened immune system. RISKS AND COMPLICATIONS Complications related to thrush are rare in healthy people. There are several factors that can increase your risk of developing thrush. Age Older adults, especially those who have serious health problems, are more likely to develop thrush because their immune systems are likely to be weaker. Behavior  The yeast that causes thrush can be spread by oral sex.  Heavy smoking can lower the body's ability to fight off infections. This makes thrush more likely to develop. Other conditions  False teeth  (dentures), braces, or a retainer that irritates the mouth make it hard to keep the mouth clean. An unclean mouth is more likely to develop thrush than a clean mouth.  People with a weakened immune system, such as those who have diabetes or human immunodeficiency virus (HIV) or who are undergoing chemotherapy, have an increased risk for developing thrush. Medications Some medications can allow the fungus that causes thrush to grow uncontrolled. Common ones are:  Antibiotics, especially those that kill a wide range of organisms (broad-spectrum antibiotics), such as tetracycline commonly can cause thrush.  Birth control pills (oral contraceptives).  Medications that weaken the body's immune system, such as corticosteroids. Environment Exposure over time to certain environmental chemicals, such as benzene and pesticides, can weaken the body's immune system. This increases your risk for developing infections, including thrush. SEEK IMMEDIATE MEDICAL CARE IF:  Your symptoms are getting worse or are not improving within 7 days of starting treatment.  You have symptoms of spreading infection, such as white patches on the skin outside of the mouth.  You are nursing and you have redness and pain in the nipples in spite of home treatment or if you have burning pain in the nipple area when you nurse. Your baby's mouth should also be examined to determine whether thrush is causing your symptoms. Document Released: 11/07/2003 Document Revised: 05/06/2011 Document Reviewed: 02/17/2008 [facility] Patient Information 2014 [place], [place].

## 2012-10-08 NOTE — Assessment & Plan Note (Signed)
Will stop abx for now, monitor for improvement. Has completed ~6d augmentin course. Continue hycodan qhs prn .

## 2012-10-08 NOTE — Assessment & Plan Note (Signed)
Anticipate thrush after antibiotic. Treat with nystatin swish/swallow. Update if sxs persist.

## 2012-10-09 ENCOUNTER — Other Ambulatory Visit: Payer: Self-pay | Admitting: Internal Medicine

## 2012-10-09 ENCOUNTER — Telehealth: Payer: Self-pay | Admitting: Internal Medicine

## 2012-10-09 DIAGNOSIS — Z0000 Encounter for general adult medical examination without abnormal findings: Secondary | ICD-10-CM

## 2012-10-09 DIAGNOSIS — Z131 Encounter for screening for diabetes mellitus: Secondary | ICD-10-CM

## 2012-10-09 NOTE — Telephone Encounter (Signed)
Pt also says he needs a1c checked for work screening, even though he is not diabetic.

## 2012-10-09 NOTE — Telephone Encounter (Signed)
Please have Terri enter it This is a urine screen but I think I ordered wrong in the past  Dx: V70.0 I guess

## 2012-10-09 NOTE — Telephone Encounter (Signed)
Patient received a health screening form at work.  Patient had his physical in February.  He'll bring by the form to be filled out.  Patient needs to have Continine checked.  Please put in an order for the test and patient said he would come on 10/16/12 to have lab done and drop off his form.

## 2012-10-16 ENCOUNTER — Telehealth: Payer: Self-pay | Admitting: Internal Medicine

## 2012-10-16 ENCOUNTER — Other Ambulatory Visit (INDEPENDENT_AMBULATORY_CARE_PROVIDER_SITE_OTHER): Payer: 59

## 2012-10-16 DIAGNOSIS — Z131 Encounter for screening for diabetes mellitus: Secondary | ICD-10-CM

## 2012-10-16 DIAGNOSIS — Z0000 Encounter for general adult medical examination without abnormal findings: Secondary | ICD-10-CM

## 2012-10-16 NOTE — Telephone Encounter (Signed)
Form signed Add the lab values when the report is in

## 2012-10-16 NOTE — Telephone Encounter (Signed)
The patient dropped off his health screening forms.  He is doing the lab work today   Thanks!

## 2012-10-16 NOTE — Telephone Encounter (Signed)
Will do!

## 2012-10-16 NOTE — Telephone Encounter (Signed)
Form on your desk  

## 2012-10-20 LAB — HEMOGLOBIN A1C: Est. average glucose Bld gHb Est-mCnc: 131 mg/dL

## 2012-10-30 ENCOUNTER — Ambulatory Visit (INDEPENDENT_AMBULATORY_CARE_PROVIDER_SITE_OTHER)
Admission: RE | Admit: 2012-10-30 | Discharge: 2012-10-30 | Disposition: A | Discharge status: Home / Self Care | Payer: 59 | Source: Ambulatory Visit | Attending: Internal Medicine | Admitting: Internal Medicine

## 2012-10-30 ENCOUNTER — Encounter: Payer: Self-pay | Admitting: Internal Medicine

## 2012-10-30 ENCOUNTER — Ambulatory Visit (INDEPENDENT_AMBULATORY_CARE_PROVIDER_SITE_OTHER): Payer: 59 | Admitting: Internal Medicine

## 2012-10-30 VITALS — BP 140/84 | HR 72 | Temp 97.8°F | Resp 20 | Wt 193.0 lb

## 2012-10-30 DIAGNOSIS — Z1211 Encounter for screening for malignant neoplasm of colon: Secondary | ICD-10-CM

## 2012-10-30 DIAGNOSIS — R059 Cough, unspecified: Secondary | ICD-10-CM

## 2012-10-30 DIAGNOSIS — R05 Cough: Secondary | ICD-10-CM

## 2012-10-30 MED ORDER — TRAMADOL HCL 50 MG PO TABS: 50.0000 mg | ORAL_TABLET | Freq: Every evening | ORAL | Status: DC | PRN

## 2012-10-30 NOTE — Assessment & Plan Note (Signed)
Still with cough though doesn't feel sick Has change in "pitch" with expiratory wheeze after coughing at night If worsens, would try doxy  Will try tramadol for cough at night

## 2012-10-30 NOTE — Progress Notes (Signed)
  Subjective:    Patient ID: Justin Terrell, male    DOB: 1955-11-15, 57 y.o.   MRN: [id number]  HPI feels that he started getting better on the augmentin Stopped due to the thrush--the topical Rx did help but still sees some white bumps in the back of his throat  Finished out the augmentin Still coughing at night--gets wheezing after. Needs to get throat lozenge or use cough syrup White or clear mucus No fever No SOB Still feels like gland is swollen in right neck  Current Outpatient Prescriptions on File Prior to Visit  Medication Sig Dispense Refill  . lisinopril (PRINIVIL,ZESTRIL) 10 MG tablet Take 1 tablet (10 mg total) by mouth daily.  90 tablet  3   No current facility-administered medications on file prior to visit.    Allergies  Allergen Reactions  . Aspirin     GIB at higher doses, tolerates 81mg  a day.   . Codeine Nausea And Vomiting  . Pravastatin     Had chest pain    Past Medical History  Diagnosis Date  . Hypertension   . Hyperlipidemia   . Lumbar disc disease   . GERD (gastroesophageal reflux disease)     aspirin sensitive, prn meds only    Past Surgical History  Procedure Laterality Date  . Lumbar disc surgery  1993  . Right leg compound fracture repair  1976  . Hand nerve repair  1970's    both hands  . Knee arthroscopy  1990's    right   . Doppler echocardiography  06/24/07    stress echo normal. Nl baseline EF    Family History  Problem Relation Age of Onset  . Heart disease Father   . Hypertension Father   . Diabetes Neg Hx   . Cancer Mother     colon cancer    History   Social History  . Marital Status: Married    Spouse Name: N/A    Number of Children: 0  . 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 Smoking status: Former Smoker    Quit date: 02/25/2009  . Smokeless tobacco: Never Used  . Alcohol Use: No  . Drug Use: No  . Sexual Activity: Not on file    Other Topics Concern  . Not on file   Social History Narrative   2nd marriage   Wife has 2 children (1 died)   Review of Systems No vomiting or diarrhea Appetite is okay noone else sick at home occ headache     Objective:   Physical Exam  Constitutional: He appears well-developed and well-nourished. No distress.  HENT:  Mouth/Throat: Oropharynx is clear and moist. No oropharyngeal exudate.  No exudates 2 bumps on back of tongue--not inflamed TMs normal ?slight left frontal tenderness Mild nasal inflammation  Neck: Normal range of motion. Neck supple.  Slight ant cervical nodes  Pulmonary/Chest: Effort normal and breath sounds normal. No respiratory distress. He has no wheezes. He has no rales.  No dullness  Lymphadenopathy:    He has cervical adenopathy.          Assessment & Plan:

## 2012-11-10 ENCOUNTER — Telehealth: Payer: Self-pay

## 2012-11-10 MED ORDER — DOXYCYCLINE MONOHYDRATE 100 MG PO CAPS: 100.0000 mg | ORAL_CAPSULE | Freq: Two times a day (BID) | ORAL | Status: DC

## 2012-11-10 NOTE — Telephone Encounter (Signed)
Pt seen 10/30/12 and non productive cough is no better, no fever. Pt request doxycycline to [facility] [address] that Dr Alphonsus Sias said he would call in if cough did not improve.Please advise.

## 2012-11-10 NOTE — Telephone Encounter (Signed)
Please send the Rx 

## 2012-11-10 NOTE — Telephone Encounter (Signed)
rx sent to pharmacy by e-script Spoke with patient and advised results   

## 2012-11-10 NOTE — Telephone Encounter (Signed)
Okay to send Rx for doxycycline monohydrate 100mg  bid #20 x 0

## 2012-11-10 NOTE — Telephone Encounter (Signed)
Pt called back and cough is now productive with white phlegm that goes from thin to thick mucus. Pt wanted Dr Alphonsus Sias to be aware to see if Doxycyline is still appropriate to [facility] [address]. Pt request cb.

## 2012-12-11 ENCOUNTER — Ambulatory Visit (AMBULATORY_SURGERY_CENTER): Payer: 59 | Admitting: *Deleted

## 2012-12-11 VITALS — Ht 72.0 in | Wt 190.8 lb

## 2012-12-11 DIAGNOSIS — Z1211 Encounter for screening for malignant neoplasm of colon: Secondary | ICD-10-CM

## 2012-12-11 MED ORDER — NA SULFATE-K SULFATE-MG SULF 17.5-3.13-1.6 GM/177ML PO SOLN: 1.0000 | Freq: Once | ORAL | Status: DC

## 2012-12-11 NOTE — Progress Notes (Signed)
No allergies to eggs or soy. No problems with anesthesia.  

## 2012-12-15 ENCOUNTER — Encounter: Payer: Self-pay | Admitting: Internal Medicine

## 2012-12-25 ENCOUNTER — Encounter: Payer: Self-pay | Admitting: Internal Medicine

## 2012-12-25 ENCOUNTER — Ambulatory Visit (AMBULATORY_SURGERY_CENTER): Payer: 59 | Admitting: Internal Medicine

## 2012-12-25 VITALS — BP 138/81 | HR 64 | Temp 96.1°F | Resp 20 | Ht 72.0 in | Wt 190.0 lb

## 2012-12-25 DIAGNOSIS — Q2733 Arteriovenous malformation of digestive system vessel: Secondary | ICD-10-CM

## 2012-12-25 DIAGNOSIS — D126 Benign neoplasm of colon, unspecified: Secondary | ICD-10-CM

## 2012-12-25 DIAGNOSIS — K5520 Angiodysplasia of colon without hemorrhage: Secondary | ICD-10-CM

## 2012-12-25 DIAGNOSIS — Z1211 Encounter for screening for malignant neoplasm of colon: Secondary | ICD-10-CM

## 2012-12-25 HISTORY — DX: Angiodysplasia of colon without hemorrhage: K55.20

## 2012-12-25 MED ORDER — SODIUM CHLORIDE 0.9 % IV SOLN: 500.0000 mL | INTRAVENOUS | Status: DC

## 2012-12-25 NOTE — Patient Instructions (Signed)
YOU HAD AN ENDOSCOPIC PROCEDURE TODAY AT THE [facility]: Refer to the procedure report that was given to you for any specific questions about what was found during the examination.  If the procedure report does not answer your questions, please call your gastroenterologist to clarify.  If you requested that your care partner not be given the details of your procedure findings, then the procedure report has been included in a sealed envelope for you to review at your convenience later.  YOU SHOULD EXPECT: Some feelings of bloating in the abdomen. Passage of more gas than usual.  Walking can help get rid of the air that was put into your GI tract during the procedure and reduce the bloating. If you had a lower endoscopy (such as a colonoscopy or flexible sigmoidoscopy) you may notice spotting of blood in your stool or on the toilet paper. If you underwent a bowel prep for your procedure, then you may not have a normal bowel movement for a few days.  DIET: Your first meal following the procedure should be a light meal and then it is ok to progress to your normal diet.  A half-sandwich or bowl of soup is an example of a good first meal.  Heavy or fried foods are harder to digest and may make you feel nauseous or bloated.  Likewise meals heavy in dairy and vegetables can cause extra gas to form and this can also increase the bloating.  Drink plenty of fluids but you should avoid alcoholic beverages for 24 hours.  ACTIVITY: Your care partner should take you home directly after the procedure.  You should plan to take it easy, moving slowly for the rest of the day.  You can resume normal activity the day after the procedure however you should NOT DRIVE or use heavy machinery for 24 hours (because of the sedation medicines used during the test).    SYMPTOMS TO REPORT IMMEDIATELY: A gastroenterologist can be reached at any hour.  During normal business hours, 8:30 AM to 5:00 PM Monday through Friday,  call [phone].  After hours and on weekends, please call the GI answering service at [phone] who will take a message and have the physician on call contact you.   Following lower endoscopy (colonoscopy or flexible sigmoidoscopy):  Excessive amounts of blood in the stool  Significant tenderness or worsening of abdominal pains  Swelling of the abdomen that is new, acute  Fever of 100F or higher  If any biopsies were taken you will be contacted by phone or by letter within the next 1-3 weeks.  Call your gastroenterologist if you have not heard about the biopsies in 3 weeks.  Our staff will call the home number listed on your records the next business day following your procedure to check on you and address any questions or concerns that you may have at that time regarding the information given to you following your procedure. This is a courtesy call and so if there is no answer at the home number and we have not heard from you through the emergency physician on call, we will assume that you have returned to your regular daily activities without incident.  SIGNATURES/CONFIDENTIALITY: You and/or your care partner have signed paperwork which will be entered into your electronic medical record.  These signatures attest to the fact that that the information above on your After Visit Summary has been reviewed and is understood.  Full responsibility of the confidentiality of this discharge information  lies with you and/or your care-partner.  Polyp information given.     Dr. Leone Payor will advise you in a letter about when you need a follow up colonoscopy based on pathology reports of polyps.  Hold aspirin, aspirin products, and anti-inflammatory meds for 2 weeks.

## 2012-12-25 NOTE — Progress Notes (Signed)
Patient did not experience any of the following events: a burn prior to discharge; a fall within the facility; wrong site/side/patient/procedure/implant event; or a hospital transfer or hospital admission upon discharge from the facility. (G8907) Patient did not have preoperative order for IV antibiotic SSI prophylaxis. (G8918)  

## 2012-12-25 NOTE — Op Note (Signed)
 [facility] [address] [place], [address]   COLONOSCOPY PROCEDURE REPORT  PATIENT: Justin Terrell, Justin Terrell  MR#: [id number] BIRTHDATE: [birth year] , 57  yrs. old GENDER: Male ENDOSCOPIST: Iva Boop, MD, [facility] REFERRED [id number] Alphonsus Sias, M.D. PROCEDURE DATE:  12/25/2012 PROCEDURE:   Colonoscopy with snare polypectomy First Screening Colonoscopy - Avg.  risk and is 50 yrs.  old or older Yes.  Prior Negative Screening - Now for repeat screening. N/A  History of Adenoma - Now for follow-up colonoscopy & has been > or = to 3 yrs.  N/A  Polyps Removed Today? Yes. ASA CLASS:   Class II INDICATIONS:average risk screening. MEDICATIONS: propofol (Diprivan) 200mg  IV, MAC sedation, administered by CRNA, and These medications were titrated to patient response per physician's verbal order  DESCRIPTION OF PROCEDURE:   After the risks benefits and alternatives of the procedure were thoroughly explained, informed consent was obtained.  A digital rectal exam revealed no abnormalities of the rectum, A digital rectal exam revealed no prostatic nodules, and A digital rectal exam revealed the prostate was not enlarged.   The LB [id number]  endoscope was introduced through the anus and advanced to the cecum, which was identified by both the appendix and ileocecal valve. No adverse events experienced.   The quality of the prep was excellent using Suprep  The instrument was then slowly withdrawn as the colon was fully examined.  COLON FINDINGS: Two semi-pedunculated polyps measuring 6 and 8 mm in size were found in the sigmoid colon and rectum.  A polypectomy was performed using snare cautery.  The resection was complete and the polyp tissue was completely retrieved.   Medium sized non-bleeding AVM (7mm) was found in the ascending colon.   The colon mucosa was otherwise normal.   A right colon retroflexion was performed. Retroflexed views revealed no abnormalities. The time to  cecum=1 minutes 51 seconds.  Withdrawal time=12 minutes 13 seconds.  The scope was withdrawn and the procedure completed. COMPLICATIONS: There were no complications.  ENDOSCOPIC IMPRESSION: 1.   Two semi-pedunculated polyps measuring 6 and 8 mm in size were found in the sigmoid colon and rectum; polypectomy was performed using snare cautery 2.     7 mm Cecal AVM 3.   The colon mucosa was otherwise normal - excellent prep - first colonoscopy  RECOMMENDATIONS: 1.  Timing of repeat colonoscopy will be determined by pathology findings. 2.  Hold aspirin, aspirin products, and anti-inflammatory medication for 2 weeks.  eSigned:  Iva Boop, MD, [facility] 12/25/2012 8:27 AM  cc: Karie Schwalbe, MD and The Patient

## 2012-12-25 NOTE — Progress Notes (Signed)
Pt stable to RR 

## 2012-12-25 NOTE — Progress Notes (Signed)
Called to room to assist during endoscopic procedure.  Patient ID and intended procedure confirmed with present staff. Received instructions for my participation in the procedure from the performing physician.  

## 2012-12-28 ENCOUNTER — Telehealth: Payer: Self-pay | Admitting: *Deleted

## 2012-12-28 NOTE — Telephone Encounter (Signed)
No answer, left message to cal if questions or concerns.

## 2012-12-30 ENCOUNTER — Encounter: Payer: Self-pay | Admitting: Internal Medicine

## 2012-12-30 DIAGNOSIS — Z860100 Personal history of colon polyps, unspecified: Secondary | ICD-10-CM

## 2012-12-30 HISTORY — DX: Personal history of colonic polyps: Z86.010

## 2012-12-30 HISTORY — DX: Personal history of colon polyps, unspecified: Z86.0100

## 2012-12-30 NOTE — Progress Notes (Signed)
Quick Note:  1 adenoma and 1 hyperplastic polyp - max 8 mm Repeat colonoscopy 2019 ______

## 2013-04-30 ENCOUNTER — Encounter: Payer: Self-pay | Admitting: Internal Medicine

## 2013-04-30 ENCOUNTER — Ambulatory Visit (INDEPENDENT_AMBULATORY_CARE_PROVIDER_SITE_OTHER): Payer: 59 | Admitting: Internal Medicine

## 2013-04-30 VITALS — BP 140/80 | HR 66 | Temp 98.5°F | Ht 72.0 in | Wt 188.0 lb

## 2013-04-30 DIAGNOSIS — I10 Essential (primary) hypertension: Secondary | ICD-10-CM

## 2013-04-30 DIAGNOSIS — Z0000 Encounter for general adult medical examination without abnormal findings: Secondary | ICD-10-CM

## 2013-04-30 DIAGNOSIS — E785 Hyperlipidemia, unspecified: Secondary | ICD-10-CM

## 2013-04-30 MED ORDER — LISINOPRIL 10 MG PO TABS: 10.0000 mg | ORAL_TABLET | Freq: Every day | ORAL | Status: DC

## 2013-04-30 NOTE — Assessment & Plan Note (Signed)
BP Readings from Last 3 Encounters:  04/30/13 140/80  12/25/12 138/81  10/30/12 140/84   Good control No changes needed

## 2013-04-30 NOTE — Progress Notes (Signed)
Pre visit review using our clinic review tool, if applicable. No additional management support is needed unless otherwise documented below in the visit note. 

## 2013-04-30 NOTE — Assessment & Plan Note (Signed)
Discussed primary prevention He prefers to hold off on statins Rx for labs---he just had [place]

## 2013-04-30 NOTE — Assessment & Plan Note (Signed)
Healthy Discussed weight training for his arthritis issues PSA deferred till next year

## 2013-04-30 NOTE — Progress Notes (Signed)
Subjective:    Patient ID: Justin Terrell, male    DOB: March 09, 1955, 58 y.o.   MRN: [id number]  HPI Here for physical Some aches and pain Shoulder and right knee chronic issues--though sporadic. Left shoulder pops since fractured clavicle Hasn't used meds Feels back occasionally --not that bad Walks regularly, tries to eat right  Otherwise feels good No job or FH changes Mom is doing better from her rectal cancer  Current Outpatient Prescriptions on File Prior to Visit  Medication Sig Dispense Refill  . aspirin 81 MG tablet Take 81 mg by mouth daily.       No current facility-administered medications on file prior to visit.    Allergies  Allergen Reactions  . Aspirin     GIB at higher doses, tolerates 81mg  a day.   . Codeine Nausea And Vomiting  . Pravastatin     Had chest pain    Past Medical History  Diagnosis Date  . Hypertension   . Hyperlipidemia   . Lumbar disc disease   . GERD (gastroesophageal reflux disease)     aspirin sensitive, prn meds only  . AVM (arteriovenous malformation) of ascending colon 12/25/2012  . Personal history of colonic adenoma 12/30/2012    Past Surgical History  Procedure Laterality Date  . Lumbar disc surgery  1993  . Right leg compound fracture repair  1976  . Hand nerve repair  1970's    both hands  . Knee arthroscopy  1990's    right   . Doppler echocardiography  06/24/07    stress echo normal. Nl baseline EF    Family History  Problem Relation Age of Onset  . Heart disease Father   . Hypertension Father   . Diabetes Neg Hx   . Cancer Mother     colon cancer  . Colon cancer Mother 99    History   Social History  . Marital Status: Married    Spouse Name: N/A    Number of Children: 0  . 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 Smoking status: Former Smoker    Quit date: 02/25/2009  . Smokeless tobacco: Never Used  . Alcohol Use: No  .  Drug Use: No  . Sexual Activity: Not on file   Other Topics Concern  . Not on file   Social History Narrative   2nd marriage   Wife has 2 children (1 died)   Review of Systems  Constitutional: Negative for fatigue and unexpected weight change.       Wears seat belt  HENT: Negative for dental problem, hearing loss and tinnitus.        Overdue for the dentist Mouth is dry at night  Eyes:       No diplopia or unilateral vision loss  Respiratory: Negative for cough, chest tightness and shortness of breath.   Cardiovascular: Negative for chest pain, palpitations and leg swelling.  Gastrointestinal: Positive for nausea. Negative for vomiting, abdominal pain, constipation and blood in stool.       Occasional nausea-- better if he eats No heartburn  Endocrine: Negative for cold intolerance and heat intolerance.  Genitourinary: Positive for difficulty urinating.       Occasional interrupted flow No nocturia No sexual problems  Musculoskeletal: Positive for arthralgias and back pain. Negative for joint swelling.  Skin: Negative for rash.       Dry skin No suspicious  lesions  Allergic/Immunologic: Negative for environmental allergies and immunocompromised state.  Neurological: Positive for headaches. Negative for dizziness, syncope, weakness, light-headedness and numbness.       Rare sinus headaches--no meds  Hematological: Negative for adenopathy. Does not bruise/bleed easily.  Psychiatric/Behavioral: Negative for sleep disturbance and dysphoric mood. The patient is not nervous/anxious.        Objective:   Physical Exam  Constitutional: He is oriented to person, place, and time. He appears well-developed and well-nourished. No distress.  HENT:  Head: Normocephalic and atraumatic.  Right Ear: External ear normal.  Left Ear: External ear normal.  Mouth/Throat: Oropharynx is clear and moist. No oropharyngeal exudate.  Eyes: Conjunctivae and EOM are normal. Pupils are equal, round,  and reactive to light.  Neck: Normal range of motion. Neck supple. No thyromegaly present.  Cardiovascular: Normal rate, regular rhythm, normal heart sounds and intact distal pulses.  Exam reveals no gallop.   No murmur heard. Pulmonary/Chest: Effort normal and breath sounds normal. No respiratory distress. He has no wheezes. He has no rales.  Abdominal: Soft. He exhibits no distension. There is no tenderness. There is no rebound and no guarding.  Musculoskeletal: He exhibits no edema and no tenderness.  Slight popping at left acromioclavicular joint---ROM is good though Right knee --no meniscus or ligament findings  Lymphadenopathy:    He has no cervical adenopathy.  Neurological: He is alert and oriented to person, place, and time.  Skin: No erythema.  Psychiatric: He has a normal mood and affect. His behavior is normal.          Assessment & Plan:

## 2013-05-01 ENCOUNTER — Telehealth: Payer: Self-pay | Admitting: Internal Medicine

## 2013-05-01 NOTE — Telephone Encounter (Signed)
Relevant patient education assigned to patient using Emmi. ° °

## 2013-05-03 ENCOUNTER — Telehealth: Payer: Self-pay | Admitting: Internal Medicine

## 2013-05-03 NOTE — Telephone Encounter (Signed)
Relevant patient education assigned to patient using Emmi. ° °

## 2013-05-24 ENCOUNTER — Telehealth: Payer: Self-pay

## 2013-05-24 DIAGNOSIS — M25519 Pain in unspecified shoulder: Secondary | ICD-10-CM

## 2013-05-24 NOTE — Telephone Encounter (Signed)
Pt left v/m; pt was seen 04/30/13;pt's left shoulder and wrist pain is no better;over weekend pt had some oxycodone and that did not help shoulder and wrist pain, pt has previously tried aleve and tylenol.pain level now is 6. Mobility of shoulder is limited; pt having problems moving the steering wheel of the car. Pt request referral to orthopedist in [place].

## 2013-05-27 ENCOUNTER — Telehealth: Payer: Self-pay

## 2013-05-27 NOTE — Telephone Encounter (Signed)
Rheumatology panel is remarkably vague and could include dozens of tests. I have written some basic tests to add to the regular blood work as a starting point Please fax to [facility] where ever he is getting the blood draw

## 2013-05-27 NOTE — Telephone Encounter (Signed)
.  left message to have patient return my call.  

## 2013-05-27 NOTE — Telephone Encounter (Signed)
Pt is [employer] employee and pt saw orthopedist in [place] and ortho wants rheumatology panel; pt said simpler if Dr Silvio Pate will add order of rheumatology panel to the order he has already writtten for pt to have labs at [employer] [facility] [place] [facility]; pt plans to go to [place] tomorrow 05/28/13.pt request cb. I advised pt to ck with orthopedist in [place] for order but pt request me to send note to Dr Silvio Pate.

## 2013-05-27 NOTE — Telephone Encounter (Signed)
Pt called back and I faxed to pt

## 2013-05-31 ENCOUNTER — Encounter: Payer: Self-pay | Admitting: Internal Medicine

## 2013-05-31 NOTE — Telephone Encounter (Signed)
Pt left vm requesting cb with lab results that were drawn on 05/28/13. Pt request cb [id number].

## 2013-06-01 ENCOUNTER — Encounter: Payer: Self-pay | Admitting: *Deleted

## 2013-06-01 NOTE — Telephone Encounter (Signed)
Pt seen results on mychart, I left message on machine to have him call back with number of his ortho dr.

## 2013-06-01 NOTE — Telephone Encounter (Signed)
Referral complete and records faxed.

## 2013-06-06 ENCOUNTER — Emergency Department: Payer: Self-pay | Admitting: Emergency Medicine

## 2013-06-06 LAB — BASIC METABOLIC PANEL
ANION GAP: 7 (ref 7–16)
BUN: 12 mg/dL (ref 7–18)
CALCIUM: 8.4 mg/dL — AB (ref 8.5–10.1)
Chloride: 107 mmol/L (ref 98–107)
Co2: 26 mmol/L (ref 21–32)
Creatinine: 0.77 mg/dL (ref 0.60–1.30)
EGFR (African American): >60
Glucose: 116 mg/dL — ABNORMAL HIGH (ref 65–99)
OSMOLALITY: 280 (ref 275–301)
POTASSIUM: 3.8 mmol/L (ref 3.5–5.1)
SODIUM: 140 mmol/L (ref 136–145)

## 2013-06-06 LAB — CBC
HCT: 46.4 % (ref 40.0–52.0)
HGB: 15.2 g/dL (ref 13.0–18.0)
MCH: 30.9 pg (ref 26.0–34.0)
MCHC: 32.8 g/dL (ref 32.0–36.0)
MCV: 94 fL (ref 80–100)
Platelet: 232 10*3/uL (ref 150–440)
RBC: 4.93 10*6/uL (ref 4.40–5.90)
RDW: 14.3 % (ref 11.5–14.5)
WBC: 9.3 10*3/uL (ref 3.8–10.6)

## 2013-06-06 LAB — TSH: Thyroid Stimulating Horm: 1.19 u[IU]/mL

## 2013-06-06 LAB — TROPONIN I: Troponin-I: <0.02 ng/mL

## 2013-06-06 LAB — CK-MB: CK-MB: 0.8 ng/mL (ref 0.5–3.6)

## 2013-06-06 LAB — CK: CK, Total: 93 U/L

## 2013-10-18 ENCOUNTER — Telehealth: Payer: Self-pay | Admitting: Internal Medicine

## 2013-10-18 NOTE — Telephone Encounter (Signed)
Had cholesterol and sugar done in April Will need serum cotinine done ([facility]) if that is on there--please send him to lab for this BMI is in chart

## 2013-10-18 NOTE — Telephone Encounter (Signed)
Pt calling in regards to a [employer] form that he needs filled out before 10/25/2013. Pt says form requires lab work, BMI and a nicotine test? Pt says he will bring form by to drop off to be filled out. Thank you

## 2013-10-19 NOTE — Telephone Encounter (Signed)
Spoke with patient he will bring the form by and get the nicotine test at the same time.

## 2014-05-03 ENCOUNTER — Encounter: Payer: 59 | Admitting: Internal Medicine

## 2014-05-10 ENCOUNTER — Ambulatory Visit (INDEPENDENT_AMBULATORY_CARE_PROVIDER_SITE_OTHER): Payer: 59 | Admitting: Internal Medicine

## 2014-05-10 ENCOUNTER — Other Ambulatory Visit: Payer: Self-pay | Admitting: Internal Medicine

## 2014-05-10 ENCOUNTER — Encounter: Payer: Self-pay | Admitting: Internal Medicine

## 2014-05-10 VITALS — BP 140/80 | HR 66 | Temp 97.8°F | Ht 72.0 in | Wt 187.0 lb

## 2014-05-10 DIAGNOSIS — E785 Hyperlipidemia, unspecified: Secondary | ICD-10-CM

## 2014-05-10 DIAGNOSIS — I10 Essential (primary) hypertension: Secondary | ICD-10-CM

## 2014-05-10 DIAGNOSIS — Z0000 Encounter for general adult medical examination without abnormal findings: Secondary | ICD-10-CM

## 2014-05-10 NOTE — Assessment & Plan Note (Signed)
BP Readings from Last 3 Encounters:  05/10/14 140/80  04/30/13 140/80  12/25/12 138/81   Reasonable control Due for labs

## 2014-05-10 NOTE — Progress Notes (Signed)
Subjective:    Patient ID: Justin Terrell, male    DOB: 1956-02-14, 59 y.o.   MRN: [id number]  HPI Here for physical  Did see ortho --[facility]-- last year Got cortisone shots in shoulder and wrist Probably related to 100# dog on leash---and she will lunge  Wasn't arthritis--- has built up muscles and got better collar to restrain dog somewhat  Will have occasional right frontal headache-- ?stress related Doesn't check blood pressure  Discussed cholesterol He is not really interested in taking meds  Nothing new in [place] Mom has done well since colon cancer  Current Outpatient Prescriptions on File Prior to Visit  Medication Sig Dispense Refill  . aspirin 81 MG tablet Take 81 mg by mouth daily.     No current facility-administered medications on file prior to visit.    Allergies  Allergen Reactions  . Aspirin     GIB at higher doses, tolerates 81mg  a day.   . Codeine Nausea And Vomiting  . Pravastatin     Had chest pain    Past Medical History  Diagnosis Date  . Hypertension   . Hyperlipidemia   . Lumbar disc disease   . GERD (gastroesophageal reflux disease)     aspirin sensitive, prn meds only  . AVM (arteriovenous malformation) of ascending colon 12/25/2012  . Personal history of colonic adenoma 12/30/2012    Past Surgical History  Procedure Laterality Date  . Lumbar disc surgery  1993  . Right leg compound fracture repair  1976  . Hand nerve repair  1970's    both hands  . Knee arthroscopy  1990's    right   . Doppler echocardiography  06/24/07    stress echo normal. Nl baseline EF    Family History  Problem Relation Age of Onset  . Heart disease Father   . Hypertension Father   . Diabetes Neg Hx   . Cancer Mother     colon cancer  . Colon cancer Mother 75    History   Social History  . Marital Status: Married    Spouse Name: N/A  . Number of Children: 0  . 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 Smoking status: Former Smoker    Quit date: 02/25/2009  . Smokeless tobacco: Never Used  . Alcohol Use: No  . Drug Use: No  . Sexual Activity: Not on file   Other Topics Concern  . Not on file   Social History Narrative   2nd marriage   Wife has 2 children (1 died)   Review of Systems  Constitutional: Negative for fatigue and unexpected weight change.       Wears seat belt  HENT: Negative for dental problem, hearing loss and tinnitus.        Overdue for dentist  Eyes: Negative for visual disturbance.       No diplopia or unilateral vision loss  Respiratory: Negative for cough, chest tightness and shortness of breath.   Cardiovascular: Positive for chest pain. Negative for palpitations and leg swelling.       Has some pulling on left chest if he leans over --recently Tries to walk regularly (with dog)--but very short distance. Counseled to increase  Gastrointestinal: Positive for diarrhea. Negative for nausea, vomiting, abdominal pain and blood in stool.       Brief diarrhea with spicy food  Endocrine: Negative for polydipsia and polyuria.  Genitourinary: Negative for urgency, frequency and difficulty urinating.       No sexual problems  Musculoskeletal: Positive for back pain. Negative for joint swelling and arthralgias.       Occ back pain and left leg numbness  Skin: Positive for rash.       Some acne on head--when he eats chocolate  Allergic/Immunologic: Negative for environmental allergies and immunocompromised state.  Neurological: Positive for dizziness, numbness and headaches. Negative for syncope and weakness.       Occ dizziness if he reclines his head back in chair  Hematological: Negative for adenopathy. Does not bruise/bleed easily.  Psychiatric/Behavioral: Negative for sleep disturbance and dysphoric mood. The patient is not nervous/anxious.        Objective:   Physical Exam  Constitutional: He is oriented to person, place,  and time. He appears well-developed and well-nourished. No distress.  HENT:  Head: Normocephalic and atraumatic.  Right Ear: External ear normal.  Left Ear: External ear normal.  Mouth/Throat: Oropharynx is clear and moist.  Eyes: Conjunctivae and EOM are normal. Pupils are equal, round, and reactive to light.  Neck: Normal range of motion. Neck supple. No thyromegaly present.  Cardiovascular: Normal rate, regular rhythm, normal heart sounds and intact distal pulses.  Exam reveals no gallop.   No murmur heard. Pulmonary/Chest: Effort normal and breath sounds normal. No respiratory distress. He has no wheezes. He has no rales.  Abdominal: Soft. There is no tenderness.  Musculoskeletal: He exhibits no edema or tenderness.  Lymphadenopathy:    He has no cervical adenopathy.  Neurological: He is alert and oriented to person, place, and time.  Skin: No rash noted. No erythema.  Psychiatric: He has a normal mood and affect. His behavior is normal.          Assessment & Plan:

## 2014-05-10 NOTE — Assessment & Plan Note (Signed)
Healthy Discussed increasing exercise PSA today --after discussion Colonoscopy due 2019

## 2014-05-10 NOTE — Progress Notes (Signed)
Pre visit review using our clinic review tool, if applicable. No additional management support is needed unless otherwise documented below in the visit note. 

## 2014-05-10 NOTE — Assessment & Plan Note (Signed)
Discussed meds---doesn't want statin for primary prevention at this time

## 2014-05-11 LAB — CBC WITH DIFFERENTIAL/PLATELET
Basophils Absolute: 0 10*3/uL (ref 0.0–0.2)
Basos: 1 %
EOS: 2 %
Eosinophils Absolute: 0.1 10*3/uL (ref 0.0–0.4)
HCT: 45.6 % (ref 37.5–51.0)
Hemoglobin: 15.7 g/dL (ref 12.6–17.7)
IMMATURE GRANULOCYTES: 0 %
Immature Grans (Abs): 0 10*3/uL (ref 0.0–0.1)
LYMPHS ABS: 2.2 10*3/uL (ref 0.7–3.1)
LYMPHS: 26 %
MCH: 31.2 pg (ref 26.6–33.0)
MCHC: 34.4 g/dL (ref 31.5–35.7)
MCV: 91 fL (ref 79–97)
MONOCYTES: 7 %
Monocytes Absolute: 0.6 10*3/uL (ref 0.1–0.9)
Neutrophils Absolute: 5.4 10*3/uL (ref 1.4–7.0)
Neutrophils Relative %: 64 %
Platelets: 267 10*3/uL (ref 150–379)
RBC: 5.04 x10E6/uL (ref 4.14–5.80)
RDW: 14.5 % (ref 12.3–15.4)
WBC: 8.4 10*3/uL (ref 3.4–10.8)

## 2014-05-11 LAB — LIPID PANEL
CHOL/HDL RATIO: 5.7 ratio — AB (ref 0.0–5.0)
Cholesterol, Total: 222 mg/dL — ABNORMAL HIGH (ref 100–199)
HDL: 39 mg/dL — ABNORMAL LOW (ref >39)
LDL Calculated: 160 mg/dL — ABNORMAL HIGH (ref 0–99)
Triglycerides: 115 mg/dL (ref 0–149)
VLDL CHOLESTEROL CAL: 23 mg/dL (ref 5–40)

## 2014-05-11 LAB — COMPREHENSIVE METABOLIC PANEL
ALT: 17 IU/L (ref 0–44)
AST: 17 IU/L (ref 0–40)
Albumin/Globulin Ratio: 1.7 (ref 1.1–2.5)
Albumin: 4.3 g/dL (ref 3.5–5.5)
Alkaline Phosphatase: 88 IU/L (ref 39–117)
BUN / CREAT RATIO: 15 (ref 9–20)
BUN: 12 mg/dL (ref 6–24)
Bilirubin Total: 0.4 mg/dL (ref 0.0–1.2)
CALCIUM: 9.2 mg/dL (ref 8.7–10.2)
CO2: 23 mmol/L (ref 18–29)
Chloride: 102 mmol/L (ref 97–108)
Creatinine, Ser: 0.81 mg/dL (ref 0.76–1.27)
GFR calc Af Amer: 113 mL/min/{1.73_m2} (ref >59)
GFR, EST NON AFRICAN AMERICAN: 98 mL/min/{1.73_m2} (ref >59)
Globulin, Total: 2.5 g/dL (ref 1.5–4.5)
Glucose: 97 mg/dL (ref 65–99)
POTASSIUM: 4.8 mmol/L (ref 3.5–5.2)
SODIUM: 142 mmol/L (ref 134–144)
Total Protein: 6.8 g/dL (ref 6.0–8.5)

## 2014-05-11 LAB — T4, FREE: Free T4: 1.11 ng/dL (ref 0.82–1.77)

## 2014-05-11 LAB — PSA: PSA: 3.9 ng/mL (ref 0.0–4.0)

## 2014-07-04 ENCOUNTER — Inpatient Hospital Stay
Admission: EM | Admit: 2014-07-04 | Discharge: 2014-07-06 | DRG: 282 | Disposition: A | Discharge status: Home / Self Care | Payer: 59 | Attending: Internal Medicine | Admitting: Internal Medicine

## 2014-07-04 ENCOUNTER — Encounter: Payer: Self-pay | Admitting: Emergency Medicine

## 2014-07-04 ENCOUNTER — Emergency Department: Payer: 59

## 2014-07-04 DIAGNOSIS — Z79899 Other long term (current) drug therapy: Secondary | ICD-10-CM

## 2014-07-04 DIAGNOSIS — Z888 Allergy status to other drugs, medicaments and biological substances status: Secondary | ICD-10-CM

## 2014-07-04 DIAGNOSIS — F17210 Nicotine dependence, cigarettes, uncomplicated: Secondary | ICD-10-CM | POA: Diagnosis present

## 2014-07-04 DIAGNOSIS — E785 Hyperlipidemia, unspecified: Secondary | ICD-10-CM | POA: Diagnosis present

## 2014-07-04 DIAGNOSIS — Z7982 Long term (current) use of aspirin: Secondary | ICD-10-CM

## 2014-07-04 DIAGNOSIS — I214 Non-ST elevation (NSTEMI) myocardial infarction: Principal | ICD-10-CM | POA: Diagnosis present

## 2014-07-04 DIAGNOSIS — I10 Essential (primary) hypertension: Secondary | ICD-10-CM | POA: Diagnosis present

## 2014-07-04 DIAGNOSIS — Q2733 Arteriovenous malformation of digestive system vessel: Secondary | ICD-10-CM

## 2014-07-04 DIAGNOSIS — Z885 Allergy status to narcotic agent status: Secondary | ICD-10-CM

## 2014-07-04 DIAGNOSIS — E875 Hyperkalemia: Secondary | ICD-10-CM | POA: Diagnosis present

## 2014-07-04 DIAGNOSIS — R079 Chest pain, unspecified: Secondary | ICD-10-CM | POA: Diagnosis present

## 2014-07-04 DIAGNOSIS — Z8249 Family history of ischemic heart disease and other diseases of the circulatory system: Secondary | ICD-10-CM

## 2014-07-04 LAB — CBC
HCT: 46.9 % (ref 40.0–52.0)
HEMOGLOBIN: 15.3 g/dL (ref 13.0–18.0)
MCH: 30.1 pg (ref 26.0–34.0)
MCHC: 32.7 g/dL (ref 32.0–36.0)
MCV: 92.2 fL (ref 80.0–100.0)
PLATELETS: 232 10*3/uL (ref 150–440)
RBC: 5.09 MIL/uL (ref 4.40–5.90)
RDW: 14.3 % (ref 11.5–14.5)
WBC: 10.3 10*3/uL (ref 3.8–10.6)

## 2014-07-04 LAB — COMPREHENSIVE METABOLIC PANEL
ALT: 20 U/L (ref 17–63)
ANION GAP: 9 (ref 5–15)
AST: 21 U/L (ref 15–41)
Albumin: 4 g/dL (ref 3.5–5.0)
Alkaline Phosphatase: 87 U/L (ref 38–126)
BUN: 15 mg/dL (ref 6–20)
CO2: 24 mmol/L (ref 22–32)
CREATININE: 0.91 mg/dL (ref 0.61–1.24)
Calcium: 9.5 mg/dL (ref 8.9–10.3)
Chloride: 107 mmol/L (ref 101–111)
GFR calc Af Amer: >60 mL/min (ref >60)
GFR calc non Af Amer: >60 mL/min (ref >60)
GLUCOSE: 106 mg/dL — AB (ref 65–99)
Potassium: 3.7 mmol/L (ref 3.5–5.1)
Sodium: 140 mmol/L (ref 135–145)
Total Bilirubin: 0.5 mg/dL (ref 0.3–1.2)
Total Protein: 7 g/dL (ref 6.5–8.1)

## 2014-07-04 LAB — TROPONIN I: Troponin I: <0.03 ng/mL (ref <0.031)

## 2014-07-04 LAB — APTT: APTT: 28 s (ref 24–36)

## 2014-07-04 MED ORDER — NITROGLYCERIN 0.4 MG SL SUBL
0.4000 mg | SUBLINGUAL_TABLET | SUBLINGUAL | Status: DC | PRN
  Administered 2014-07-04 – 2014-07-05 (×2): 0.4 mg via SUBLINGUAL
  Filled 2014-07-04: qty 1

## 2014-07-04 NOTE — ED Provider Notes (Signed)
[facility] Emergency Department Provider Note  ____________________________________________  Time seen:  2205  I have reviewed the triage vital signs and the nursing notes.   HISTORY  Chief Complaint Chest Pain and Numbness   History limited by: Not Limited   HPI Justin Terrell is a 59 y.o. male He presents to the emergency department with concerns for chest pain. He states the first episode of pain started roughly 3 hours ago and lasted 15-20 minutes. He had just finished dinner when he was sitting in the pain started. He states he took 2 Tums and a full strength aspirin. He then was feeling okay for roughly an hour and a half before he started having the pain again. He describes the pain as being located substernally and is sharp as well as tight. He states he did have some shortness of breath with the second episode of pain. He continues to have 3 out of 10 pain here in the emergency department. States he is having some radiation down his left arm. Although he states he has had heartburn in the past this pain does not remind him completely of heartburn. Denies any recent fevers.     Past Medical History  Diagnosis Date  . Hypertension   . Hyperlipidemia   . Lumbar disc disease   . GERD (gastroesophageal reflux disease)     aspirin sensitive, prn meds only  . AVM (arteriovenous malformation) of ascending colon 12/25/2012  . Personal history of colonic adenoma 12/30/2012    Patient Active Problem List   Diagnosis Date Noted  . Personal history of colonic adenoma 12/30/2012  . AVM (arteriovenous malformation) of ascending colon 12/25/2012  . Routine general medical examination at a health care facility 04/23/2012  . Hypertension   . Hyperlipidemia   . Lumbar disc disease   . GERD (gastroesophageal reflux disease)     Past Surgical History  Procedure Laterality Date  . Lumbar disc surgery  1993  . Right leg compound fracture repair  1976  . Hand  nerve repair  1970's    both hands  . Knee arthroscopy  1990's    right   . Doppler echocardiography  06/24/07    stress echo normal. Nl baseline EF  . Back surgery      Current Outpatient Rx  Name  Route  Sig  Dispense  Refill  . aspirin 81 MG tablet   Oral   Take 81 mg by mouth daily.         [name] Kitchen lisinopril (PRINIVIL,ZESTRIL) 10 MG tablet      TAKE ONE TABLET BY MOUTH ONCE DAILY   90 tablet   3     Allergies Aspirin; Codeine; and Pravastatin  Family History  Problem Relation Age of Onset  . Heart disease Father   . Hypertension Father   . Diabetes Neg Hx   . Cancer Mother     colon cancer  . Colon cancer Mother 28    Social History History  Substance Use Topics  . Smoking status: Current Some Day Smoker    Last Attempt to Quit: 02/25/2009  . Smokeless tobacco: Never Used  . Alcohol Use: No    Review of Systems  Constitutional: Negative for fever. Cardiovascular: positive for chest pain. Respiratory: positive for shortness of breath. Gastrointestinal: Negative for abdominal pain, vomiting and diarrhea. Genitourinary: Negative for dysuria. Musculoskeletal: Negative for back pain. Skin: Negative for rash. Neurological: Negative for headaches, focal weakness or numbness.   10-point ROS  otherwise negative.  ____________________________________________   PHYSICAL EXAM:  VITAL SIGNS: ED Triage Vitals  Enc Vitals Group     BP 07/04/14 2134 175/87 mmHg     Pulse Rate 07/04/14 2134 73     Resp --      Temp 07/04/14 2134 97.6 F (36.4 C)     Temp Source 07/04/14 2134 Oral     SpO2 07/04/14 2134 94 %     Weight 07/04/14 2134 182 lb (82.555 kg)     Height 07/04/14 2134 6' (1.829 m)     Head Cir --      Peak Flow --      Pain Score 07/04/14 2136 5   Constitutional: Alert and oriented. Well appearing and in no distress. Eyes: Conjunctivae are normal. PERRL. Normal extraocular movements. ENT   Head: Normocephalic and atraumatic.   Nose: No  congestion/rhinnorhea.   Mouth/Throat: Mucous membranes are moist.   Neck: No stridor. Hematological/Lymphatic/Immunilogical: No cervical lymphadenopathy. Cardiovascular: Normal rate, regular rhythm.  No murmurs, rubs, or gallops.left chest is tender to palpation. The patient states this is the pain he has been feeling. Respiratory: Normal respiratory effort without tachypnea nor retractions. Breath sounds are clear and equal bilaterally. No wheezes/rales/rhonchi. Gastrointestinal: Soft and nontender. No distention.  Genitourinary: Deferred Musculoskeletal: Normal range of motion in all extremities. No joint effusions.  No lower extremity tenderness nor edema. Neurologic:  Normal speech and language. No gross focal neurologic deficits are appreciated. Speech is normal.  Skin:  Skin is warm, dry and intact. No rash noted. Psychiatric: Mood and affect are normal. Speech and behavior are normal. Patient exhibits appropriate insight and judgment.  ____________________________________________    LABS (pertinent positives/negatives)  Labs Reviewed  COMPREHENSIVE METABOLIC PANEL - Abnormal; Notable for the following:    Glucose, Bld 106 (*)    All other components within normal limits  APTT  CBC  TROPONIN I  TROPONIN I     ____________________________________________   EKG  EKG Time: 2147 Rate: 57 Rhythm: sinus bradycardia Axis: left axis deviation Intervals: QTC 383 QRS: Q waves V1 ST changes: in leads II, III and aVF there is slightly less than 1 mm of ST elevation morphology as an upward curve to the T wave I do not appreciate any reciprocal changes  EKG Time: 2232 Rate: 54 Rhythm: his bradycardia Axis: left axis deviation Intervals: PT C390 QRS: Q waves V1 ST changes: repeat EKG with roughly similar ST elevation in leads II, III, and aVF morphology is slightly different however these were new sticker placements.       ____________________________________________    RADIOLOGY   FINDINGS: There is mild linear scarring in the bases. The lungs are otherwise clear. The pulmonary vasculature is normal. No large effusions are evident. Hilar and mediastinal contours are unremarkable.  IMPRESSION: No acute cardiopulmonary findings.  ____________________________________________   PROCEDURES  Procedure(s) performed: None  Critical Care performed: No  ____________________________________________   INITIAL IMPRESSION / ASSESSMENT AND PLAN / ED COURSE  Pertinent labs & imaging results that were available during my care of the patient were reviewed by me and considered in my medical decision making (see chart for details).  Patient here after 2 episodes of chest pain this evening. EKG done at 2147 does have some slightly elevated ST above the baseline however not quite 1 mm and I do not see any reciprocal changes. The patient does not appear to be any distress at this time. Given the lack of her cervical changes I  do not feel that the EKG represents a STEMI at this time will however watch patient closely and repeat EKG as well as send blood work. Patient states he is ready taken full dose aspirin today. Will try nitroglycerin.   ----------------------------------------- 11:42 PM on 07/04/2014 -----------------------------------------  Patient did get some relief of the pain after the nitroglycerin. Repeat EKG after nitroglycerin showed perhaps slightly less elevation however morphology slightly more concerning. Initial troponin is negative but given the EKG findings I believe the safest course is to admit patient for repeat cardiac enzymes and further evaluation. I have talked to the hospitalist and they have agreed for admission.  ____________________________________________   FINAL CLINICAL IMPRESSION(S) / ED DIAGNOSES  Final diagnoses:  Chest pain, unspecified chest pain type      Nance Pear, MD 07/05/14 2244

## 2014-07-04 NOTE — ED Notes (Signed)
Pt arrived to the ED accompanied by his wife for complaints of chest pain and shortness of breath that started about 2 hrs ago. Pt states that the pain comes in waves and it radiated to his left arm. Pt reports having a Hx of chest pains with no diagnosis. Pt is AOx4 in mild chest discomfort.

## 2014-07-05 ENCOUNTER — Encounter
Admission: EM | Disposition: A | Discharge status: Home / Self Care | Payer: 59 | Source: Home / Self Care | Attending: Internal Medicine

## 2014-07-05 ENCOUNTER — Encounter: Payer: Self-pay | Admitting: Internal Medicine

## 2014-07-05 DIAGNOSIS — Q2733 Arteriovenous malformation of digestive system vessel: Secondary | ICD-10-CM | POA: Diagnosis not present

## 2014-07-05 DIAGNOSIS — I10 Essential (primary) hypertension: Secondary | ICD-10-CM | POA: Diagnosis present

## 2014-07-05 DIAGNOSIS — Z7982 Long term (current) use of aspirin: Secondary | ICD-10-CM | POA: Diagnosis not present

## 2014-07-05 DIAGNOSIS — Z888 Allergy status to other drugs, medicaments and biological substances status: Secondary | ICD-10-CM | POA: Diagnosis not present

## 2014-07-05 DIAGNOSIS — Z79899 Other long term (current) drug therapy: Secondary | ICD-10-CM | POA: Diagnosis not present

## 2014-07-05 DIAGNOSIS — R079 Chest pain, unspecified: Secondary | ICD-10-CM | POA: Diagnosis present

## 2014-07-05 DIAGNOSIS — E875 Hyperkalemia: Secondary | ICD-10-CM | POA: Diagnosis present

## 2014-07-05 DIAGNOSIS — Z885 Allergy status to narcotic agent status: Secondary | ICD-10-CM | POA: Diagnosis not present

## 2014-07-05 DIAGNOSIS — F17210 Nicotine dependence, cigarettes, uncomplicated: Secondary | ICD-10-CM | POA: Diagnosis present

## 2014-07-05 DIAGNOSIS — I214 Non-ST elevation (NSTEMI) myocardial infarction: Secondary | ICD-10-CM

## 2014-07-05 DIAGNOSIS — E785 Hyperlipidemia, unspecified: Secondary | ICD-10-CM | POA: Diagnosis present

## 2014-07-05 DIAGNOSIS — Z8249 Family history of ischemic heart disease and other diseases of the circulatory system: Secondary | ICD-10-CM | POA: Diagnosis not present

## 2014-07-05 HISTORY — PX: CARDIAC CATHETERIZATION: SHX172

## 2014-07-05 LAB — CREATININE, SERUM
Creatinine, Ser: 0.77 mg/dL (ref 0.61–1.24)
GFR calc Af Amer: >60 mL/min (ref >60)
GFR calc non Af Amer: >60 mL/min (ref >60)

## 2014-07-05 LAB — CBC
HCT: 44.6 % (ref 40.0–52.0)
Hemoglobin: 14.8 g/dL (ref 13.0–18.0)
MCH: 30.5 pg (ref 26.0–34.0)
MCHC: 33.2 g/dL (ref 32.0–36.0)
MCV: 92.1 fL (ref 80.0–100.0)
Platelets: 235 10*3/uL (ref 150–440)
RBC: 4.84 MIL/uL (ref 4.40–5.90)
RDW: 14.4 % (ref 11.5–14.5)
WBC: 12.2 10*3/uL — ABNORMAL HIGH (ref 3.8–10.6)

## 2014-07-05 LAB — TROPONIN I
Troponin I: 0.45 ng/mL — ABNORMAL HIGH (ref <0.031)
Troponin I: 1.23 ng/mL — ABNORMAL HIGH (ref <0.031)
Troponin I: 4.7 ng/mL — ABNORMAL HIGH (ref <0.031)

## 2014-07-05 LAB — HEPARIN LEVEL (UNFRACTIONATED): Heparin Unfractionated: 0.24 IU/mL — ABNORMAL LOW (ref 0.30–0.70)

## 2014-07-05 LAB — PROTIME-INR
INR: 1.08
Prothrombin Time: 14.2 seconds (ref 11.4–15.0)

## 2014-07-05 SURGERY — LEFT HEART CATH AND CORONARY ANGIOGRAPHY
Anesthesia: Moderate Sedation

## 2014-07-05 MED ORDER — ISOSORBIDE MONONITRATE ER 30 MG PO TB24
30.0000 mg | ORAL_TABLET | Freq: Every day | ORAL | Status: DC
  Administered 2014-07-05: 30 mg via ORAL
  Filled 2014-07-05: qty 1

## 2014-07-05 MED ORDER — MORPHINE SULFATE 2 MG/ML IJ SOLN
1.0000 mg | INTRAMUSCULAR | Status: DC | PRN
  Administered 2014-07-05 (×3): 1 mg via INTRAVENOUS
  Administered 2014-07-05: 2 mg via INTRAVENOUS
  Administered 2014-07-05: 1 mg via INTRAVENOUS
  Filled 2014-07-05 (×5): qty 1

## 2014-07-05 MED ORDER — ASPIRIN EC 81 MG PO TBEC
81.0000 mg | DELAYED_RELEASE_TABLET | Freq: Every day | ORAL | Status: DC
  Administered 2014-07-06: 81 mg via ORAL
  Filled 2014-07-05 (×2): qty 1

## 2014-07-05 MED ORDER — NITROGLYCERIN 2 % TD OINT
1.0000 [in_us] | TOPICAL_OINTMENT | Freq: Four times a day (QID) | TRANSDERMAL | Status: AC
  Administered 2014-07-05: 1 [in_us] via TOPICAL

## 2014-07-05 MED ORDER — SODIUM CHLORIDE 0.9 % WEIGHT BASED INFUSION: 1.0000 mL/kg/h | INTRAVENOUS | Status: DC

## 2014-07-05 MED ORDER — FENTANYL CITRATE (PF) 100 MCG/2ML IJ SOLN
INTRAMUSCULAR | Status: DC | PRN
  Administered 2014-07-05: 25 ug via INTRAVENOUS

## 2014-07-05 MED ORDER — HEPARIN BOLUS VIA INFUSION
1200.0000 [IU] | Freq: Once | INTRAVENOUS | Status: AC
  Administered 2014-07-05: 1200 [IU] via INTRAVENOUS
  Filled 2014-07-05: qty 1200

## 2014-07-05 MED ORDER — ATORVASTATIN CALCIUM 20 MG PO TABS
40.0000 mg | ORAL_TABLET | Freq: Every day | ORAL | Status: DC
  Administered 2014-07-05: 40 mg via ORAL
  Filled 2014-07-05: qty 2

## 2014-07-05 MED ORDER — SODIUM CHLORIDE 0.9 % IJ SOLN: 3.0000 mL | INTRAMUSCULAR | Status: DC | PRN

## 2014-07-05 MED ORDER — HEPARIN SODIUM (PORCINE) 5000 UNIT/ML IJ SOLN
4000.0000 [IU] | INTRAMUSCULAR | Status: AC
  Administered 2014-07-05: 4000 [IU] via INTRAVENOUS
  Filled 2014-07-05: qty 1

## 2014-07-05 MED ORDER — ACETAMINOPHEN 325 MG PO TABS
ORAL_TABLET | ORAL | Status: AC
  Administered 2014-07-05: 650 mg via ORAL
  Filled 2014-07-05: qty 2

## 2014-07-05 MED ORDER — SODIUM CHLORIDE 0.9 % IJ SOLN
3.0000 mL | Freq: Two times a day (BID) | INTRAMUSCULAR | Status: DC
  Administered 2014-07-05 – 2014-07-06 (×4): 3 mL via INTRAVENOUS

## 2014-07-05 MED ORDER — NITROGLYCERIN 2 % TD OINT
TOPICAL_OINTMENT | TRANSDERMAL | Status: AC
Start: 2014-07-05 — End: 2014-07-05
  Administered 2014-07-05: 1 [in_us] via TOPICAL
  Filled 2014-07-05: qty 1

## 2014-07-05 MED ORDER — SODIUM CHLORIDE 0.9 % IV SOLN: 250.0000 mL | INTRAVENOUS | Status: DC | PRN

## 2014-07-05 MED ORDER — LISINOPRIL 10 MG PO TABS
10.0000 mg | ORAL_TABLET | Freq: Every day | ORAL | Status: DC
  Administered 2014-07-05 – 2014-07-06 (×2): 10 mg via ORAL
  Filled 2014-07-05 (×2): qty 1

## 2014-07-05 MED ORDER — ACETAMINOPHEN 325 MG PO TABS
650.0000 mg | ORAL_TABLET | Freq: Four times a day (QID) | ORAL | Status: DC | PRN
  Administered 2014-07-05: 650 mg via ORAL

## 2014-07-05 MED ORDER — SODIUM CHLORIDE 0.9 % IV SOLN: INTRAVENOUS | Status: DC

## 2014-07-05 MED ORDER — CLOPIDOGREL BISULFATE 75 MG PO TABS
75.0000 mg | ORAL_TABLET | Freq: Every day | ORAL | Status: DC
  Administered 2014-07-05 – 2014-07-06 (×2): 75 mg via ORAL
  Filled 2014-07-05 (×2): qty 1

## 2014-07-05 MED ORDER — ACETAMINOPHEN 650 MG RE SUPP: 650.0000 mg | Freq: Four times a day (QID) | RECTAL | Status: DC | PRN

## 2014-07-05 MED ORDER — FENTANYL CITRATE (PF) 100 MCG/2ML IJ SOLN
INTRAMUSCULAR | Status: AC
  Filled 2014-07-05: qty 2

## 2014-07-05 MED ORDER — HEPARIN (PORCINE) IN NACL 100-0.45 UNIT/ML-% IJ SOLN
1000.0000 [IU]/h | INTRAMUSCULAR | Status: DC
  Administered 2014-07-05: 1000 [IU]/h via INTRAVENOUS
  Filled 2014-07-05 (×3): qty 250

## 2014-07-05 MED ORDER — MIDAZOLAM HCL 2 MG/2ML IJ SOLN
INTRAMUSCULAR | Status: DC | PRN
  Administered 2014-07-05: 1 mg via INTRAVENOUS

## 2014-07-05 MED ORDER — SODIUM CHLORIDE 0.9 % WEIGHT BASED INFUSION: 3.0000 mL/kg/h | INTRAVENOUS | Status: DC

## 2014-07-05 MED ORDER — SODIUM CHLORIDE 0.9 % IJ SOLN
3.0000 mL | Freq: Two times a day (BID) | INTRAMUSCULAR | Status: DC
  Administered 2014-07-05: 3 mL via INTRAVENOUS

## 2014-07-05 MED ORDER — HEPARIN (PORCINE) IN NACL 2-0.9 UNIT/ML-% IJ SOLN
INTRAMUSCULAR | Status: AC
  Filled 2014-07-05: qty 1000

## 2014-07-05 MED ORDER — HEPARIN SODIUM (PORCINE) 5000 UNIT/ML IJ SOLN: 5000.0000 [IU] | Freq: Three times a day (TID) | INTRAMUSCULAR | Status: DC

## 2014-07-05 MED ORDER — MIDAZOLAM HCL 2 MG/2ML IJ SOLN
INTRAMUSCULAR | Status: AC
  Filled 2014-07-05: qty 2

## 2014-07-05 MED ORDER — ONDANSETRON HCL 4 MG PO TABS: 4.0000 mg | ORAL_TABLET | Freq: Four times a day (QID) | ORAL | Status: DC | PRN

## 2014-07-05 MED ORDER — HEPARIN (PORCINE) IN NACL 100-0.45 UNIT/ML-% IJ SOLN
1150.0000 [IU]/h | INTRAMUSCULAR | Status: DC
  Administered 2014-07-05: 1150 [IU]/h via INTRAVENOUS
  Filled 2014-07-05 (×2): qty 250

## 2014-07-05 MED ORDER — ASPIRIN 81 MG PO CHEW: 81.0000 mg | CHEWABLE_TABLET | ORAL | Status: DC

## 2014-07-05 MED ORDER — ONDANSETRON HCL 4 MG/2ML IJ SOLN: 4.0000 mg | Freq: Four times a day (QID) | INTRAMUSCULAR | Status: DC | PRN

## 2014-07-05 MED ORDER — DOCUSATE SODIUM 100 MG PO CAPS
100.0000 mg | ORAL_CAPSULE | Freq: Two times a day (BID) | ORAL | Status: DC
  Filled 2014-07-05 (×3): qty 1

## 2014-07-05 SURGICAL SUPPLY — 9 items
CATH INFINITI 5FR ANG PIGTAIL (CATHETERS) ×2 IMPLANT
CATH INFINITI 5FR JL4 (CATHETERS) ×2 IMPLANT
CATH INFINITI JR4 5F (CATHETERS) ×2 IMPLANT
DEVICE CLOSURE MYNXGRIP 5F (Vascular Products) ×2 IMPLANT
KIT MANI 3VAL PERCEP (MISCELLANEOUS) ×2 IMPLANT
NEEDLE PERC 18GX7CM (NEEDLE) ×2 IMPLANT
PACK CARDIAC CATH (CUSTOM PROCEDURE TRAY) ×2 IMPLANT
SHEATH AVANTI 5FR X 11CM (SHEATH) ×2 IMPLANT
WIRE EMERALD 3MM-J .035X150CM (WIRE) ×2 IMPLANT

## 2014-07-05 NOTE — Progress Notes (Signed)
Spoke to Dr. Saralyn Pilar- Have a blockage in coronary, but risk of intervention is more- so did not put stent. Suggest medical management. Start ASA+ Plavix,imdur, statin and likely d/c tomorrow.

## 2014-07-05 NOTE — Progress Notes (Signed)
[place] [facility] [place] NAME: Rudy Luhmann    MR#:  [id number]  DATE OF BIRTH:  07/01/1955  SUBJECTIVE:  CHIEF COMPLAINT:   Chief Complaint  Patient presents with  . Chest Pain  . Numbness    REVIEW OF SYSTEMS:  CONSTITUTIONAL: No fever, fatigue or weakness.  EYES: No blurred or double vision.  EARS, NOSE, AND THROAT: No tinnitus or ear pain.  RESPIRATORY: No cough, shortness of breath, wheezing or hemoptysis.  CARDIOVASCULAR: positive for chest pain, orthopnea, edema.  GASTROINTESTINAL: No nausea, vomiting, diarrhea or abdominal pain.  GENITOURINARY: No dysuria, hematuria.  ENDOCRINE: No polyuria, nocturia,  HEMATOLOGY: No anemia, easy bruising or bleeding SKIN: No rash or lesion. MUSCULOSKELETAL: No joint pain or arthritis.   NEUROLOGIC: No tingling, numbness, weakness.  PSYCHIATRY: No anxiety or depression.   ROS  DRUG ALLERGIES:   Allergies  Allergen Reactions  . Codeine Nausea And Vomiting  . Pravastatin Other (See Comments)    Reaction:  Chest pain     VITALS:  Blood pressure 140/63, pulse 56, temperature 97.7 F (36.5 C), temperature source Oral, resp. rate 18, height 6' (1.829 m), weight 80.967 kg (178 lb 8 oz), SpO2 99 %.  PHYSICAL EXAMINATION:  GENERAL:  59 y.o.-year-old patient lying in the bed with no acute distress.  EYES: Pupils equal, round, reactive to light and accommodation. No scleral icterus. Extraocular muscles intact.  HEENT: Head atraumatic, normocephalic. Oropharynx and nasopharynx clear.  NECK:  Supple, no jugular venous distention. No thyroid enlargement, no tenderness.  LUNGS: Normal breath sounds bilaterally, no wheezing, rales,rhonchi or crepitation. No use of accessory muscles of respiration.  CARDIOVASCULAR: S1, S2 normal. No murmurs, rubs, or gallops.  ABDOMEN: Soft, nontender, nondistended. Bowel sounds present. No organomegaly or mass.  EXTREMITIES: No pedal edema, cyanosis, or  clubbing.  NEUROLOGIC: Cranial nerves II through XII are intact. Muscle strength 5/5 in all extremities. Sensation intact. Gait not checked.  PSYCHIATRIC: The patient is alert and oriented x 3.  SKIN: No obvious rash, lesion, or ulcer.   Physical Exam LABORATORY PANEL:   CBC  Recent Labs Lab 07/05/14 0337  WBC 12.2*  HGB 14.8  HCT 44.6  PLT 235   ------------------------------------------------------------------------------------------------------------------  Chemistries   Recent Labs Lab 07/04/14 2150 07/05/14 0337  NA 140  --   K 3.7  --   CL 107  --   CO2 24  --   GLUCOSE 106*  --   BUN 15  --   CREATININE 0.91 0.77  CALCIUM 9.5  --   AST 21  --   ALT 20  --   ALKPHOS 87  --   BILITOT 0.5  --    ------------------------------------------------------------------------------------------------------------------  Cardiac Enzymes  Recent Labs Lab 07/05/14 0100 07/05/14 0923  TROPONINI 0.45* 4.70*   ------------------------------------------------------------------------------------------------------------------  RADIOLOGY:  Dg Chest Portable 1 View  07/04/2014   CLINICAL DATA:  Chest pain and dyspnea, 2 hr duration. Radiating to the left arm.  EXAM: PORTABLE CHEST - 1 VIEW  COMPARISON:  06/06/2013  FINDINGS: There is mild linear scarring in the bases. The lungs are otherwise clear. The pulmonary vasculature is normal. No large effusions are evident. Hilar and mediastinal contours are unremarkable.  IMPRESSION: No acute cardiopulmonary findings.   Electronically Signed   By: Andreas Newport M.D.   On: 07/04/2014 22:22     ASSESSMENT AND PLAN:    This is a 59 year old male admitted for persistent chest pain and NSTEMI. *  Chest pain: NSTEMI; troponins elevated and ST changes in inferior leads on EKGs. nitroglycerin paste on Appreciated cardiology consult , On Heparin drip, plan for cardiac cath today. * Hypertension: Continue lisinopril * DVT prophylaxis:  The patient is on therapeutic heparin * Hyperlipidemia- has allergy to statin. * GI prophylaxis: None   All the records are reviewed and case discussed with Care Management/Social Workerr. Management plans discussed with the patient, family and they are in agreement.  CODE STATUS:full   TOTAL TIME TAKING CARE OF THIS PATIENT: 35  minutes.   POSSIBLE D/C IN 1-2 DAYS, DEPENDING ON CLINICAL CONDITION.   Vaughan Basta M.D on 07/05/2014   Between 7am to 6pm - Pager - [phone]  After 6pm go to [email or web address] - password [email or web address] [facility]  Office  [phone]  CC: Primary care physician; Viviana Simpler, MD

## 2014-07-05 NOTE — H&P (Addendum)
Justin Terrell is an 59 y.o. male.   Chief Complaint: Chest pain HPI: The patient presented to emergency department complaining of chest pain that began at rest. He states it started in his left upper chest and then radiated to his left arm which began to tingle. The patient took 2 Tums and 2 aspirins tablets which relieved his pain. The pain returned later which prompted patient to come to the emergency department. By this time the pain was 6 out of 10 in severity and associated with shortness of breath and some lightheadedness. The pain decreased dramatically with sublingual nitroglycerin. Now patient states that his chest feels like it is burning as if he had heartburn. In the emergency department the patient's initial EKGs showed some possible ST elevation in inferior leads which prompted emergency to call for admission.  Past Medical History  Diagnosis Date  . Hypertension   . Hyperlipidemia   . Lumbar disc disease   . GERD (gastroesophageal reflux disease)     aspirin sensitive, prn meds only  . AVM (arteriovenous malformation) of ascending colon 12/25/2012  . Personal history of colonic adenoma 12/30/2012    Past Surgical History  Procedure Laterality Date  . Lumbar disc surgery  1993  . Right leg compound fracture repair  1976  . Hand nerve repair  1970's    both hands  . Knee arthroscopy  1990's    right   . Doppler echocardiography  06/24/07    stress echo normal. Nl baseline EF  . Back surgery    . Angioplasty      thigh    Family History  Problem Relation Age of Onset  . Heart disease Father   . Hypertension Father   . Diabetes Neg Hx   . Cancer Mother     colon cancer  . Colon cancer Mother 36  . Coronary artery disease Father   . Valvular heart disease Father    Social History:  reports that he has been smoking Cigarettes.  He has been smoking about 0.10 packs per day. He has never used smokeless tobacco. He reports that he does not drink alcohol or use illicit  drugs.  Allergies:  Allergies  Allergen Reactions  . Codeine Nausea And Vomiting  . Pravastatin Other (See Comments)    Reaction:  Chest pain     Medications Prior to Admission  Medication Sig Dispense Refill  . aspirin EC 81 MG tablet Take 81 mg by mouth daily.    [name] Kitchen lisinopril (PRINIVIL,ZESTRIL) 10 MG tablet TAKE ONE TABLET BY MOUTH ONCE DAILY 90 tablet 3    Results for orders placed or performed during the hospital encounter of 07/04/14 (from the past 48 hour(s))  APTT     Status: None   Collection Time: 07/04/14  9:50 PM  Result Value Ref Range   aPTT 28 24 - 36 seconds  CBC     Status: None   Collection Time: 07/04/14  9:50 PM  Result Value Ref Range   WBC 10.3 3.8 - 10.6 K/uL   RBC 5.09 4.40 - 5.90 MIL/uL   Hemoglobin 15.3 13.0 - 18.0 g/dL   HCT 46.9 40.0 - 52.0 %   MCV 92.2 80.0 - 100.0 fL   MCH 30.1 26.0 - 34.0 pg   MCHC 32.7 32.0 - 36.0 g/dL   RDW 14.3 11.5 - 14.5 %   Platelets 232 150 - 440 K/uL  Comprehensive metabolic panel     Status: Abnormal   Collection Time:  07/04/14  9:50 PM  Result Value Ref Range   Sodium 140 135 - 145 mmol/L   Potassium 3.7 3.5 - 5.1 mmol/L   Chloride 107 101 - 111 mmol/L   CO2 24 22 - 32 mmol/L   Glucose, Bld 106 (H) 65 - 99 mg/dL   BUN 15 6 - 20 mg/dL   Creatinine, Ser 0.91 0.61 - 1.24 mg/dL   Calcium 9.5 8.9 - 10.3 mg/dL   Total Protein 7.0 6.5 - 8.1 g/dL   Albumin 4.0 3.5 - 5.0 g/dL   AST 21 15 - 41 U/L   ALT 20 17 - 63 U/L   Alkaline Phosphatase 87 38 - 126 U/L   Total Bilirubin 0.5 0.3 - 1.2 mg/dL   GFR calc non Af Amer >60 >60 mL/min   GFR calc Af Amer >60 >60 mL/min    Comment: (NOTE) The eGFR has been calculated using the CKD EPI equation. This calculation has not been validated in all clinical situations. eGFR's persistently <60 mL/min signify possible Chronic Kidney Disease.    Anion gap 9 5 - 15  Troponin I     Status: None   Collection Time: 07/04/14  9:50 PM  Result Value Ref Range   Troponin I <0.03  <0.031 ng/mL    Comment:        NO INDICATION OF MYOCARDIAL INJURY.   Troponin I     Status: Abnormal   Collection Time: 07/05/14  1:00 AM  Result Value Ref Range   Troponin I 0.45 (H) <0.031 ng/mL    Comment: READ BACK AND VERIFIED BY LEA FERGUSON @0147 07/05/14 .AJO        PERSISTENTLY INCREASED TROPONIN VALUES IN THE RANGE OF 0.04-0.49 ng/mL CAN BE SEEN IN:       -UNSTABLE ANGINA       -CONGESTIVE HEART FAILURE       -MYOCARDITIS       -CHEST TRAUMA       -ARRYHTHMIAS       -LATE PRESENTING MYOCARDIAL INFARCTION       -COPD   CLINICAL FOLLOW-UP RECOMMENDED.   CBC     Status: Abnormal   Collection Time: 07/05/14  3:37 AM  Result Value Ref Range   WBC 12.2 (H) 3.8 - 10.6 K/uL   RBC 4.84 4.40 - 5.90 MIL/uL   Hemoglobin 14.8 13.0 - 18.0 g/dL   HCT 44.6 40.0 - 52.0 %   MCV 92.1 80.0 - 100.0 fL   MCH 30.5 26.0 - 34.0 pg   MCHC 33.2 32.0 - 36.0 g/dL   RDW 14.4 11.5 - 14.5 %   Platelets 235 150 - 440 K/uL  Creatinine, serum     Status: None   Collection Time: 07/05/14  3:37 AM  Result Value Ref Range   Creatinine, Ser 0.77 0.61 - 1.24 mg/dL   GFR calc non Af Amer >60 >60 mL/min   GFR calc Af Amer >60 >60 mL/min    Comment: (NOTE) The eGFR has been calculated using the CKD EPI equation. This calculation has not been validated in all clinical situations. eGFR's persistently <60 mL/min signify possible Chronic Kidney Disease.   Protime-INR     Status: None   Collection Time: 07/05/14  3:37 AM  Result Value Ref Range   Prothrombin Time 14.2 11.4 - 15.0 seconds   INR 1.08    Dg Chest Portable 1 View  07/04/2014   CLINICAL DATA:  Chest pain and dyspnea, 2 hr duration. Radiating to   the left arm.  EXAM: PORTABLE CHEST - 1 VIEW  COMPARISON:  06/06/2013  FINDINGS: There is mild linear scarring in the bases. The lungs are otherwise clear. The pulmonary vasculature is normal. No large effusions are evident. Hilar and mediastinal contours are unremarkable.  IMPRESSION: No acute  cardiopulmonary findings.   Electronically Signed   By: Daniel R Mitchell M.D.   On: 07/04/2014 22:22    Review of Systems  Constitutional: Negative for fever and chills.  HENT: Negative for sore throat and tinnitus.   Eyes: Negative for blurred vision and redness.  Respiratory: Positive for cough and shortness of breath.   Cardiovascular: Positive for chest pain. Negative for palpitations, orthopnea and PND.  Gastrointestinal: Negative for nausea, vomiting, abdominal pain and diarrhea.  Genitourinary: Negative for dysuria, urgency and frequency.  Musculoskeletal: Negative for myalgias and joint pain.  Skin: Negative for rash.       No lesions  Neurological: Negative for speech change, focal weakness and weakness.  Endo/Heme/Allergies: Does not bruise/bleed easily.       No temperature intolerance  Psychiatric/Behavioral: Negative for depression and suicidal ideas.    Blood pressure 140/63, pulse 56, temperature 97.7 F (36.5 C), temperature source Oral, resp. rate 18, height 6' (1.829 m), weight 80.967 kg (178 lb 8 oz), SpO2 99 %. Physical Exam  Nursing note and vitals reviewed. Constitutional: He is oriented to person, place, and time. He appears well-developed and well-nourished. No distress.  HENT:  Head: Normocephalic and atraumatic.  Mouth/Throat: No oropharyngeal exudate.  Eyes: Conjunctivae and EOM are normal. Pupils are equal, round, and reactive to light.  Neck: Normal range of motion. No JVD present. No thyromegaly present.  Cardiovascular: Normal rate, regular rhythm and normal heart sounds.  Exam reveals no gallop and no friction rub.   No murmur heard. Respiratory: Effort normal. He has no wheezes. He has no rales.  GI: Soft. Bowel sounds are normal. He exhibits no distension. There is no tenderness.  Genitourinary:  deferred  Musculoskeletal: Normal range of motion. He exhibits no edema.  Lymphadenopathy:    He has no cervical adenopathy.  Neurological: He is  alert and oriented to person, place, and time. No cranial nerve deficit.  Skin: Skin is warm and dry. No rash noted.  Psychiatric: He has a normal mood and affect. His behavior is normal. Judgment and thought content normal.     Assessment/Plan This is a 58-year-old male admitted for persistent chest pain and NSTEMI. 1. Chest pain: NSTEMI; troponins elevated and ST changes in inferior leads on 2 out of 3 EKGs. I have placed nitroglycerin paste on the patient's chest. His chest pain has resolved. Cardiology consult ordered. Heparin drip initiated. 2. Hypertension: Continue lisinopril 3. DVT prophylaxis: The patient is on therapeutic heparin 4. GI prophylaxis: None The patient is a full code. Time spent on admission orders and patient care possibly 45 minutes  Diamond,  Michael S 07/05/2014, 7:53 AM    

## 2014-07-05 NOTE — Progress Notes (Signed)
Report to [facility] utilizing [facility]L at 1315. Transfer to 2a

## 2014-07-05 NOTE — Progress Notes (Signed)
VSS. Cath showed some stenosis. Room air. Justin Terrell. No chest pain following cath. R groin no bleeding or hematoma. Heparin drip d/c. Will d/c tomorrow 5/11 in AM. Wife at the bedside. Pt has no further concerns at this time.

## 2014-07-05 NOTE — Progress Notes (Signed)
ANTICOAGULATION CONSULT NOTE - Follow Up Consult  Pharmacy Consult for Heparin Indication: chest pain/ACS  Allergies  Allergen Reactions  . Codeine Nausea And Vomiting  . Pravastatin Other (See Comments)    Reaction:  Chest pain     Patient Measurements: Height: 6' (182.9 cm) Weight: 178 lb 8 oz (80.967 kg) IBW/kg (Calculated) : 77.6 Heparin Dosing Weight: 82.6  Vital Signs: Temp: 97.7 F (36.5 C) (05/10 0508) Temp Source: Oral (05/10 0508) BP: 140/63 mmHg (05/10 0745) Pulse Rate: 56 (05/10 0745)  Labs:  Recent Labs  07/04/14 2150 07/05/14 0100 07/05/14 0337 07/05/14 0923  HGB 15.3  --  14.8  --   HCT 46.9  --  44.6  --   PLT 232  --  235  --   APTT 28  --   --   --   LABPROT  --   --  14.2  --   INR  --   --  1.08  --   HEPARINUNFRC  --   --   --  0.24*  CREATININE 0.91  --  0.77  --   TROPONINI <0.03 0.45*  --  4.70*    Estimated Creatinine Clearance: 110.5 mL/min (by C-G formula based on Cr of 0.77).   Medical History: Past Medical History  Diagnosis Date  . Hypertension   . Hyperlipidemia   . Lumbar disc disease   . GERD (gastroesophageal reflux disease)     aspirin sensitive, prn meds only  . AVM (arteriovenous malformation) of ascending colon 12/25/2012  . Personal history of colonic adenoma 12/30/2012    Medications:  Prescriptions prior to admission  Medication Sig Dispense Refill Last Dose  . aspirin EC 81 MG tablet Take 81 mg by mouth daily.   07/04/2014 at Unknown time  . lisinopril (PRINIVIL,ZESTRIL) 10 MG tablet TAKE ONE TABLET BY MOUTH ONCE DAILY 90 tablet 3 07/04/2014 at Unknown time   Scheduled:  . [START ON 07/06/2014] aspirin  81 mg Oral Pre-Cath  . aspirin EC  81 mg Oral Daily  . docusate sodium  100 mg Oral BID  . heparin  1,200 Units Intravenous Once  . lisinopril  10 mg Oral Daily  . sodium chloride  3 mL Intravenous Q12H  . sodium chloride  3 mL Intravenous Q12H   Infusions:  . [START ON 07/06/2014] sodium chloride     Followed by  . [START ON 07/06/2014] sodium chloride    . heparin      Assessment: Heparin 4000 units per MD and infusion per pharmacy for NSTEMI.   Anti-Xa at 0923 on 5/10: 0.24  Goal of Therapy:  Heparin level 0.3-0.7 units/ml Monitor platelets by anticoagulation protocol: Yes   Plan:  Anti-Xa level below goal range at 0.24.  Will order Heparin bolus of 1200 units IV once then increase drip rate to 1150 units/hr.  Will recheck anti-Xa level in 6 hours at 1700 on 5/10.  Pharmacy will continue to follow.  Murrell Converse, PharmD Clinical Pharmacist 07/05/2014

## 2014-07-05 NOTE — Progress Notes (Signed)
Called MD Parashcos about trop 4.70, will continue to watch

## 2014-07-05 NOTE — Care Management (Signed)
It appears at present that patient is not being discharged home on any cost prohibitive anticoagulation meds.

## 2014-07-05 NOTE — ED Notes (Signed)
MD at bedside. 

## 2014-07-05 NOTE — ED Notes (Addendum)
Troponin 0.45

## 2014-07-05 NOTE — Progress Notes (Signed)
MD paged for PRN pain meds, MD to put orders in for morphine.

## 2014-07-05 NOTE — Progress Notes (Signed)
ANTICOAGULATION CONSULT NOTE - Initial Consult  Pharmacy Consult for Heparin Indication: chest pain/ACS  Allergies  Allergen Reactions  . Codeine Nausea And Vomiting  . Pravastatin Other (See Comments)    Reaction:  Chest pain     Patient Measurements: Height: 6' (182.9 cm) Weight: 178 lb 8 oz (80.967 kg) IBW/kg (Calculated) : 77.6 Heparin Dosing Weight: 82.6  Vital Signs: Temp: 97.9 F (36.6 C) (05/10 0218) Temp Source: Oral (05/10 0218) BP: 186/86 mmHg (05/10 0218) Pulse Rate: 51 (05/10 0218)  Labs:  Recent Labs  07/04/14 2150 07/05/14 0100  HGB 15.3  --   HCT 46.9  --   PLT 232  --   APTT 28  --   CREATININE 0.91  --   TROPONINI <0.03 0.45*    Estimated Creatinine Clearance: 97.1 mL/min (by C-G formula based on Cr of 0.91).   Medical History: Past Medical History  Diagnosis Date  . Hypertension   . Hyperlipidemia   . Lumbar disc disease   . GERD (gastroesophageal reflux disease)     aspirin sensitive, prn meds only  . AVM (arteriovenous malformation) of ascending colon 12/25/2012  . Personal history of colonic adenoma 12/30/2012    Medications:  Prescriptions prior to admission  Medication Sig Dispense Refill Last Dose  . aspirin EC 81 MG tablet Take 81 mg by mouth daily.   07/04/2014 at Unknown time  . lisinopril (PRINIVIL,ZESTRIL) 10 MG tablet TAKE ONE TABLET BY MOUTH ONCE DAILY 90 tablet 3 07/04/2014 at Unknown time   Scheduled:  . aspirin EC  81 mg Oral Daily  . docusate sodium  100 mg Oral BID  . heparin  5,000 Units Subcutaneous 3 times per day  . lisinopril  10 mg Oral Daily  . sodium chloride  3 mL Intravenous Q12H   Infusions:  . sodium chloride    . heparin      Assessment: Heparin 4000 units per MD and infusion per pharmacy for NSTEMI.  Goal of Therapy:  Heparin level 0.3-0.7 units/ml Monitor platelets by anticoagulation protocol: Yes   Plan:  Give 4000 units bolus x 1 Start heparin infusion at 1000 units/hr Check anti-Xa  level in 6 hours and daily while on heparin Continue to monitor H&H and platelets  Ulice Dash D 07/05/2014,3:03 AM

## 2014-07-05 NOTE — Consult Note (Signed)
[facility] Cardiology  CARDIOLOGY CONSULT NOTE  Patient ID: Justin Terrell MRN: [id number] DOB/AGE: Aug 24, 1955 59 y.o.  Admit date: 07/04/2014 Referring Physician Rosilyn Mings Primary Physician Viviana Simpler Primary Cardiologist Miquel Dunn Reason for Consultation non-ST elevation myocardial infarction  HPI: The patient is a 59 year old gentleman with history of hypertension who presents with new onset chest pain. The patient was in his usual state of health until last evening and he started to experience substernal and left-sided chest discomfort described as a pressure sensation. The first episode lasted approximately 20 minutes. The patient reports that he experienced radiation down his left arm with associated shortness of breath. Following the initial episode, the patient had a second episode of chest pain and was brought to [facility] emergency room. Initial EKG revealed nonspecific ST-T wave abnormalities. First troponin was negative, however, the second and third troponins were mildly elevated. Patient was started on heparin drip and was admitted to telemetry where he has continued to experience imminent episodes of chest discomfort described as pressure or tightness.  Review of systems complete and found to be negative unless listed above     Past Medical History  Diagnosis Date  . Hypertension   . Hyperlipidemia   . Lumbar disc disease   . GERD (gastroesophageal reflux disease)     aspirin sensitive, prn meds only  . AVM (arteriovenous malformation) of ascending colon 12/25/2012  . Personal history of colonic adenoma 12/30/2012    Past Surgical History  Procedure Laterality Date  . Lumbar disc surgery  1993  . Right leg compound fracture repair  1976  . Hand nerve repair  1970's    both hands  . Knee arthroscopy  1990's    right   . Doppler echocardiography  06/24/07    stress echo normal. Nl baseline EF  . Back surgery    . Angioplasty      thigh    Prescriptions prior to  admission  Medication Sig Dispense Refill Last Dose  . aspirin EC 81 MG tablet Take 81 mg by mouth daily.   07/04/2014 at Unknown time  . lisinopril (PRINIVIL,ZESTRIL) 10 MG tablet TAKE ONE TABLET BY MOUTH ONCE DAILY 90 tablet 3 07/04/2014 at Unknown time   History   Social History  . Marital Status: Married    Spouse Name: N/A  . Number of Children: 0  . 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 Smoking status: Current Some Day Smoker -- 0.10 packs/day    Types: Cigarettes    Last Attempt to Quit: 02/25/2009  . Smokeless tobacco: Never Used  . Alcohol Use: No  . Drug Use: No  . Sexual Activity: Not on file   Other Topics Concern  . Not on file   Social History Narrative   2nd marriage   Wife has 2 children (1 died)   Works in Engineer, technical sales    Family History  Problem Relation Age of Onset  . Heart disease Father   . Hypertension Father   . Diabetes Neg Hx   . Cancer Mother     colon cancer  . Colon cancer Mother 24  . Coronary artery disease Father   . Valvular heart disease Father       Review of systems complete and found to be negative unless listed above      Physical Exam: Blood pressure 140/63, pulse 56, temperature 97.7 F (36.5 C), temperature source Oral,  resp. rate 18, height 6' (1.829 m), weight 80.967 kg (178 lb 8 oz), SpO2 99 %.   General appearance: alert and cooperative Labs:   Lab Results  Component Value Date   WBC 12.2* 07/05/2014   HGB 14.8 07/05/2014   HCT 44.6 07/05/2014   MCV 92.1 07/05/2014   PLT 235 07/05/2014    Recent Labs Lab 07/04/14 2150 07/05/14 0337  NA 140  --   K 3.7  --   CL 107  --   CO2 24  --   BUN 15  --   CREATININE 0.91 0.77  CALCIUM 9.5  --   PROT 7.0  --   BILITOT 0.5  --   ALKPHOS 87  --   ALT 20  --   AST 21  --   GLUCOSE 106*  --    Lab Results  Component Value Date   TROPONINI 0.45* 07/05/2014    Lab Results  Component Value Date    CHOL 222* 05/10/2014   CHOL 234* 04/23/2012   CHOL 222* 10/10/2011   Lab Results  Component Value Date   HDL 39* 05/10/2014   HDL 33* 04/23/2012   HDL 39* 10/10/2011   Lab Results  Component Value Date   LDLCALC 160* 05/10/2014   LDLCALC 149* 04/23/2012   LDLCALC 146* 10/10/2011   Lab Results  Component Value Date   TRIG 115 05/10/2014   TRIG 260* 04/23/2012   TRIG 185* 10/10/2011   Lab Results  Component Value Date   CHOLHDL 5.7* 05/10/2014   CHOLHDL 7.1* 04/23/2012   CHOLHDL 5.7* 10/10/2011   No results found for: LDLDIRECT    Radiology: Dg Chest Portable 1 View  07/04/2014   CLINICAL DATA:  Chest pain and dyspnea, 2 hr duration. Radiating to the left arm.  EXAM: PORTABLE CHEST - 1 VIEW  COMPARISON:  06/06/2013  FINDINGS: There is mild linear scarring in the bases. The lungs are otherwise clear. The pulmonary vasculature is normal. No large effusions are evident. Hilar and mediastinal contours are unremarkable.  IMPRESSION: No acute cardiopulmonary findings.   Electronically Signed   By: Andreas Newport M.D.   On: 07/04/2014 22:22    EKG: normal sinus rhythm with nonspecific ST abnormality  ASSESSMENT AND PLAN:  59 year old gentleman with history of hypertension who presents with new onset chest pain, abnormal ECG with elevated troponin consistent with non-ST elevation myocardial infarction  1. Agree with overall treatment plan 2. Proceed with cardiac catheterization with selective coronary arteriography. The risks, benefits and alternatives were explained to the patient and informed written consent was obtained.  SignedIsaias Cowman MD,PhD, [facility] 07/05/2014, 8:46 AM

## 2014-07-05 NOTE — Progress Notes (Signed)
Patient D/C his tele and got dressed to go outside without the nurse's notice. The NT noticed this because the patient was not showing on the tele monitoring. The RN was notified and went to patient's room. The patient was dressed up ready to go when the RN went to the room. The  Patient was educated that he's supposed to be on bedrest. Patient then understood and took his pants off and allowed the RN to connect his leads back on. Right groin was assessed and no  Bleeding or any hematoma noted. Patient now resting on his bed. Refused his colace 100 mg po, stated he had BM yesterday

## 2014-07-06 ENCOUNTER — Telehealth: Payer: Self-pay | Admitting: Internal Medicine

## 2014-07-06 ENCOUNTER — Encounter: Payer: Self-pay | Admitting: Cardiology

## 2014-07-06 LAB — HEPARIN LEVEL (UNFRACTIONATED): Heparin Unfractionated: <0.1 IU/mL — ABNORMAL LOW (ref 0.30–0.70)

## 2014-07-06 MED ORDER — CLOPIDOGREL BISULFATE 75 MG PO TABS: 75.0000 mg | ORAL_TABLET | Freq: Every day | ORAL | Status: DC

## 2014-07-06 MED ORDER — METOPROLOL SUCCINATE ER 25 MG PO TB24: 25.0000 mg | ORAL_TABLET | Freq: Every day | ORAL | Status: DC

## 2014-07-06 MED ORDER — METOPROLOL SUCCINATE ER 25 MG PO TB24
25.0000 mg | ORAL_TABLET | Freq: Every day | ORAL | Status: DC
  Administered 2014-07-06: 25 mg via ORAL
  Filled 2014-07-06: qty 1

## 2014-07-06 MED ORDER — ATORVASTATIN CALCIUM 40 MG PO TABS: 40.0000 mg | ORAL_TABLET | Freq: Every day | ORAL | Status: DC

## 2014-07-06 NOTE — Progress Notes (Addendum)
Pharmacist called to verify if patient was still receiving Heparin drip. The RN checked and the heparin was supposed to be running continuously at 11.68mL/hr.  Dr. Lavetta Nielsen notified and received order to D/C the order for Heparin drip. Order D/C

## 2014-07-06 NOTE — Discharge Instructions (Signed)
Diet- low sodium,Low fat. Activities- no strenuous activities.

## 2014-07-06 NOTE — Progress Notes (Signed)
[facility]  SUBJECTIVE: No chest pain   Filed Vitals:   07/05/14 1220 07/05/14 1334 07/05/14 1949 07/06/14 0528  BP: 146/78 149/83 138/67 130/67  Pulse: 52 51 51 60  Temp:  97.9 F (36.6 C) 98.3 F (36.8 C) 98.4 F (36.9 C)  TempSrc:  Oral Oral   Resp: 17 18 18 18   Height:      Weight:    81.375 kg (179 lb 6.4 oz)  SpO2: 95% 97% 97% 97%     Intake/Output Summary (Last 24 hours) at 07/06/14 0815 Last data filed at 07/06/14 0700  Gross per 24 hour  Intake 962.04 ml  Output   3125 ml  Net -2162.96 ml      PHYSICAL EXAM  General: Well developed, well nourished, in no acute distress HEENT:  Normocephalic and atramatic Neck:  No JVD.  Lungs: Clear bilaterally to auscultation and percussion. Heart: HRRR . Normal S1 and S2 without gallops or murmurs.  Abdomen: Bowel sounds are positive, abdomen soft and non-tender  Msk:  Back normal, normal gait. Normal strength and tone for age. Extremities: No clubbing, cyanosis or edema.   Neuro: Alert and oriented X 3. Psych:  Good affect, responds appropriately   LABS: Basic Metabolic Panel:  Recent Labs  07/04/14 2150 07/05/14 0337  NA 140  --   K 3.7  --   CL 107  --   CO2 24  --   GLUCOSE 106*  --   BUN 15  --   CREATININE 0.91 0.77  CALCIUM 9.5  --    Liver Function Tests:  Recent Labs  07/04/14 2150  AST 21  ALT 20  ALKPHOS 87  BILITOT 0.5  PROT 7.0  ALBUMIN 4.0   No results for input(s): LIPASE, AMYLASE in the last 72 hours. CBC:  Recent Labs  07/04/14 2150 07/05/14 0337  WBC 10.3 12.2*  HGB 15.3 14.8  HCT 46.9 44.6  MCV 92.2 92.1  PLT 232 235   Cardiac Enzymes:  Recent Labs  07/05/14 0100 07/05/14 0337 07/05/14 0923  TROPONINI 0.45* 1.23* 4.70*   BNP: Invalid input(s): POCBNP D-Dimer: No results for input(s): DDIMER in the last 72 hours. Hemoglobin A1C: No results for input(s): HGBA1C in the last 72 hours. Fasting Lipid Panel: No results for input(s): CHOL, HDL, LDLCALC,  TRIG, CHOLHDL, LDLDIRECT in the last 72 hours. Thyroid Function Tests: No results for input(s): TSH, T4TOTAL, T3FREE, THYROIDAB in the last 72 hours.  Invalid input(s): FREET3 Anemia Panel: No results for input(s): VITAMINB12, FOLATE, FERRITIN, TIBC, IRON, RETICCTPCT in the last 72 hours.  Dg Chest Portable 1 View  07/04/2014   CLINICAL DATA:  Chest pain and dyspnea, 2 hr duration. Radiating to the left arm.  EXAM: PORTABLE CHEST - 1 VIEW  COMPARISON:  06/06/2013  FINDINGS: There is mild linear scarring in the bases. The lungs are otherwise clear. The pulmonary vasculature is normal. No large effusions are evident. Hilar and mediastinal contours are unremarkable.  IMPRESSION: No acute cardiopulmonary findings.   Electronically Signed   By: Andreas Newport M.D.   On: 07/04/2014 22:22     Echo TELEMETRY: Normal sinus rhythm:  ASSESSMENT AND PLAN:  Principal Problem:   NSTEMI (non-ST elevated myocardial infarction) Active Problems:   Hyperkalemia   Chest pain    1. Non-ST elevation myocardial infarction. Cardiac catheterization on 07/05/14 revealed occlusion of mid posterior descending artery likely culprit vessel. It was felt that PCI would be at higher risk than continued medical therapy,  so PCI was deferred. The patient has had no recurrent chest pain and has done well overnight, ambulating without difficulty.  REC  #1 continue medical therapy #2 DC Imdur due to headache #3 metoprolol succinate 25 mg daily #4 continue statin therapy #5 May DC home later today #6 enroll in cardiac rehabilitation   Lexxi Koslow, MD, PhD, [facility] 07/06/2014 8:15 AM

## 2014-07-06 NOTE — Discharge Summary (Signed)
[facility] NAME: Justin Terrell    MR#:  [id number]  DATE OF BIRTH:  Jul 22, 1955  DATE OF ADMISSION:  07/04/2014 ADMITTING PHYSICIAN: Harrie Foreman, MD  DATE OF DISCHARGE: No discharge date for patient encounter.  PRIMARY CARE PHYSICIAN: Viviana Simpler, MD    ADMISSION DIAGNOSIS:  Chest pain, unspecified chest pain type [R07.9]  DISCHARGE DIAGNOSIS:  Principal Problem:   NSTEMI (non-ST elevated myocardial infarction) Active Problems:   Hyperkalemia   Chest pain   SECONDARY DIAGNOSIS:   Past Medical History  Diagnosis Date  . Hypertension   . Hyperlipidemia   . Lumbar disc disease   . GERD (gastroesophageal reflux disease)     aspirin sensitive, prn meds only  . AVM (arteriovenous malformation) of ascending colon 12/25/2012  . Personal history of colonic adenoma 12/30/2012    HOSPITAL COURSE:   This is a 59 year old male admitted for persistent chest pain and NSTEMI. * Chest pain: NSTEMI; troponins elevated and ST changes in inferior leads on EKGs. nitroglycerin paste on Appreciated cardiology consult , started on Heparin drip, Done cardiac cath - showed some blockages   Dr. Saralyn Pilar suggested medical management, * Hypertension: Continue lisinopril, added metoprolol. * DVT prophylaxis: The patient is on therapeutic heparin * Hyperlipidemia- atorvastatin, * GI prophylaxis: None  DISCHARGE CONDITIONS:   stable.  CONSULTS OBTAINED:  Treatment Team:  Isaias Cowman, MD  DRUG ALLERGIES:   Allergies  Allergen Reactions  . Codeine Nausea And Vomiting  . Pravastatin Other (See Comments)    Reaction:  Chest pain     DISCHARGE MEDICATIONS:   Current Discharge Medication List    START taking these medications   Details  atorvastatin (LIPITOR) 40 MG tablet Take 1 tablet (40 mg total) by mouth daily at 6 PM. Qty: 30 tablet, Refills: 0    clopidogrel (PLAVIX) 75 MG tablet Take 1 tablet (75 mg  total) by mouth daily. Qty: 30 tablet, Refills: 0    metoprolol succinate (TOPROL-XL) 25 MG 24 hr tablet Take 1 tablet (25 mg total) by mouth daily. Qty: 30 tablet, Refills: 0      CONTINUE these medications which have NOT CHANGED   Details  aspirin EC 81 MG tablet Take 81 mg by mouth daily.    lisinopril (PRINIVIL,ZESTRIL) 10 MG tablet TAKE ONE TABLET BY MOUTH ONCE DAILY Qty: 90 tablet, Refills: 3         DISCHARGE INSTRUCTIONS:    Diet- Low sodium, low fat Activities- no strenuous activities. Follow in cardio clinic in 1 week.  If you experience worsening of your admission symptoms, develop shortness of breath, life threatening emergency, suicidal or homicidal thoughts you must seek medical attention immediately by calling 911 or calling your MD immediately  if symptoms less severe.  You Must read complete instructions/literature along with all the possible adverse reactions/side effects for all the Medicines you take and that have been prescribed to you. Take any new Medicines after you have completely understood and accept all the possible adverse reactions/side effects.   Please note  You were cared for by a hospitalist during your hospital stay. If you have any questions about your discharge medications or the care you received while you were in the hospital after you are discharged, you can call the unit and asked to speak with the hospitalist on call if the hospitalist that took care of you is not available. Once you are discharged, your primary care physician will handle  any further medical issues. Please note that NO REFILLS for any discharge medications will be authorized once you are discharged, as it is imperative that you return to your primary care physician (or establish a relationship with a primary care physician if you do not have one) for your aftercare needs so that they can reassess your need for medications and monitor your lab values.    Today   CHIEF  COMPLAINT:   Chief Complaint  Patient presents with  . Chest Pain  . Numbness    HISTORY OF PRESENT ILLNESS:  The patient presented to emergency department complaining of chest pain that began at rest. He states it started in his left upper chest and then radiated to his left arm which began to tingle. The patient took 2 Tums and 2 aspirins tablets which relieved his pain. The pain returned later which prompted patient to come to the emergency department. By this time the pain was 6 out of 10 in severity and associated with shortness of breath and some lightheadedness. The pain decreased dramatically with sublingual nitroglycerin. Now patient states that his chest feels like it is burning as if he had heartburn. In the emergency department the patient's initial EKGs showed some possible ST elevation in inferior leads which prompted emergency to call for admission.  VITAL SIGNS:  Blood pressure 131/72, pulse 63, temperature 99.6 F (37.6 C), temperature source Oral, resp. rate 18, height 6' (1.829 m), weight 81.375 kg (179 lb 6.4 oz), SpO2 96 %.  I/O:   Intake/Output Summary (Last 24 hours) at 07/06/14 1049 Last data filed at 07/06/14 0900  Gross per 24 hour  Intake 1322.04 ml  Output   2875 ml  Net -1552.96 ml    PHYSICAL EXAMINATION:   GENERAL: 59 y.o.-year-old patient lying in the bed with no acute distress.  EYES: Pupils equal, round, reactive to light and accommodation. No scleral icterus. Extraocular muscles intact.  HEENT: Head atraumatic, normocephalic. Oropharynx and nasopharynx clear.  NECK: Supple, no jugular venous distention. No thyroid enlargement, no tenderness.  LUNGS: Normal breath sounds bilaterally, no wheezing, rales,rhonchi or crepitation. No use of accessory muscles of respiration.  CARDIOVASCULAR: S1, S2 normal. No murmurs, rubs, or gallops.  ABDOMEN: Soft, nontender, nondistended. Bowel sounds present. No organomegaly or mass.  EXTREMITIES: No pedal  edema, cyanosis, or clubbing.  NEUROLOGIC: Cranial nerves II through XII are intact. Muscle strength 5/5 in all extremities. Sensation intact. Gait not checked.  PSYCHIATRIC: The patient is alert and oriented x 3.  SKIN: No obvious rash, lesion, or ulcer.  DATA REVIEW:   CBC  Recent Labs Lab 07/05/14 0337  WBC 12.2*  HGB 14.8  HCT 44.6  PLT 235    Chemistries   Recent Labs Lab 07/04/14 2150 07/05/14 0337  NA 140  --   K 3.7  --   CL 107  --   CO2 24  --   GLUCOSE 106*  --   BUN 15  --   CREATININE 0.91 0.77  CALCIUM 9.5  --   AST 21  --   ALT 20  --   ALKPHOS 87  --   BILITOT 0.5  --     Cardiac Enzymes  Recent Labs Lab 07/05/14 0923  TROPONINI 4.70*    Microbiology Results  Results for orders placed or performed in visit on 04/29/12  Fecal occult blood, imunochemical     Status: None   Collection Time: 04/29/12  3:08 PM  Result Value Ref Range Status  Fecal Occult Bld Negative Negative Final    RADIOLOGY:  Dg Chest Portable 1 View  07/04/2014   CLINICAL DATA:  Chest pain and dyspnea, 2 hr duration. Radiating to the left arm.  EXAM: PORTABLE CHEST - 1 VIEW  COMPARISON:  06/06/2013  FINDINGS: There is mild linear scarring in the bases. The lungs are otherwise clear. The pulmonary vasculature is normal. No large effusions are evident. Hilar and mediastinal contours are unremarkable.  IMPRESSION: No acute cardiopulmonary findings.   Electronically Signed   By: Andreas Newport M.D.   On: 07/04/2014 22:22    EKG:   Orders placed or performed during the hospital encounter of 07/04/14  . ED EKG  . ED EKG  . ED EKG  . ED EKG  . ED EKG (<41mins upon arrival to the ED)  . ED EKG (<77mins upon arrival to the ED)  . EKG 12-Lead  . EKG 12-Lead  . EKG 12-Lead  . EKG 12-Lead  . EKG 12-Lead  . EKG 12-Lead  . EKG      Management plans discussed with the patient, family and they are in agreement.  CODE STATUS: Full  TOTAL TIME TAKING CARE OF THIS  PATIENT: 40 minutes.    Vaughan Basta M.D on 07/06/2014 at 10:49 AM  Between 7am to 6pm - Pager - [phone]  After 6pm go to [email or web address] - password [email or web address] [facility]  Office  [phone]  CC: Primary care physician; Viviana Simpler, MD

## 2014-07-06 NOTE — Progress Notes (Signed)
Patient was medicated with Morphine 1 mg IV  For CP at the  beginning of the shift. On re-assessment, patient denied pain. Resting quietly at this time with his eyes closed, respirations even and unlabored. No bleeding or bruising noted on his right groin site.

## 2014-07-06 NOTE — Progress Notes (Signed)
Patient is discharge home in a stable condition, right groin site good , foot warm to touch with pulse palpable, denies any pain at time of discharge, summary and f/u care given verbalized understanding, left with wife

## 2014-07-06 NOTE — Telephone Encounter (Signed)
Reviewed cath report and spoke to patient and wife Started on all appropriate meds by Dr Saralyn Pilar Has follow up coming up with him Will just see him prn

## 2015-03-10 IMAGING — CT CT STONE STUDY
1 of 2 series · 15 of 32 positions shown, 19 images · non-contrast
Comparison: none

REASON FOR EXAM: R FLANK TO RLQ PAIN, EVAL STONES
COMMENTS:   May transport without cardiac monitor

[Series 2: 3mm soft tissue · axial · 0.76mm/px · z∈[-312,+132]mm · 15 of 162 slices shown, 19 images]
[im 7/162  soft-tissue]
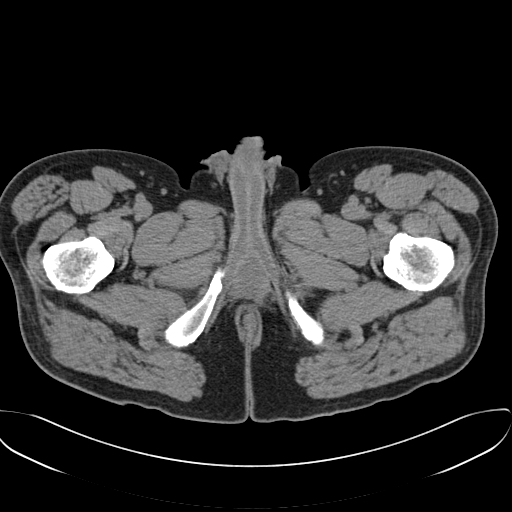
[im 7/162  bone]
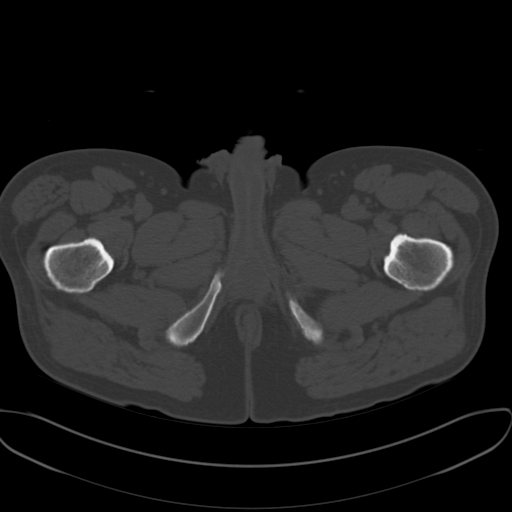
[im 20/162  soft-tissue]
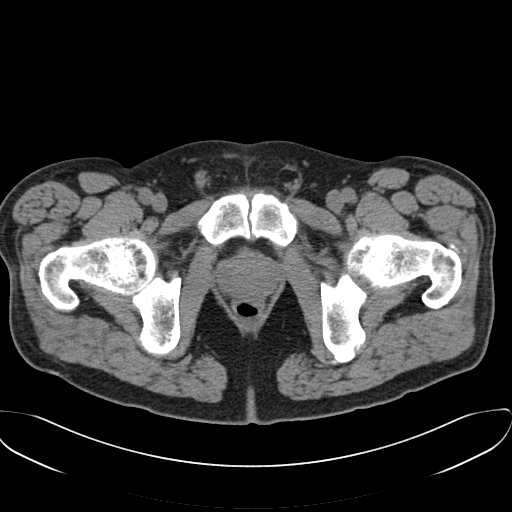
[im 33/162  soft-tissue]
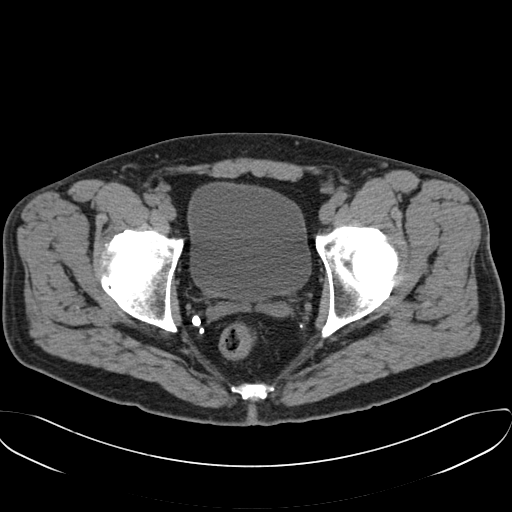
[im 46/162  soft-tissue]
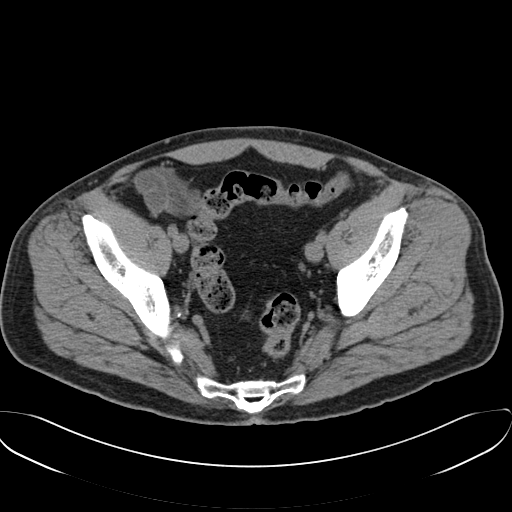
[im 58/162  soft-tissue]
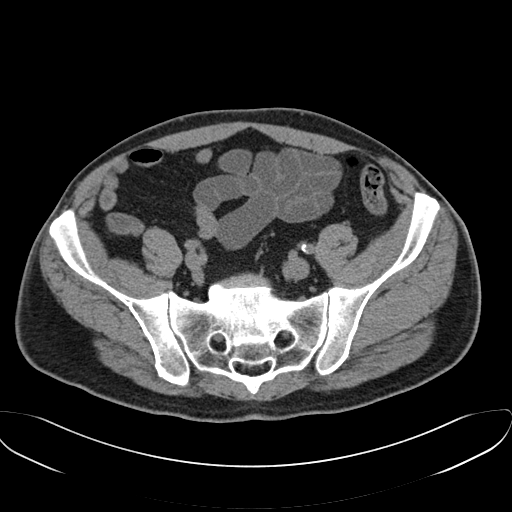
[im 71/162  soft-tissue]
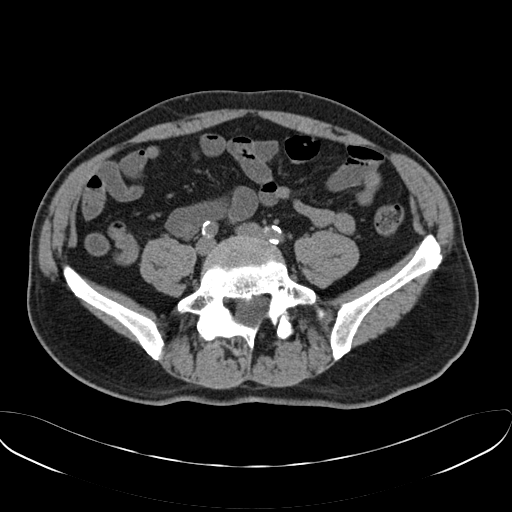
[im 84/162  soft-tissue]
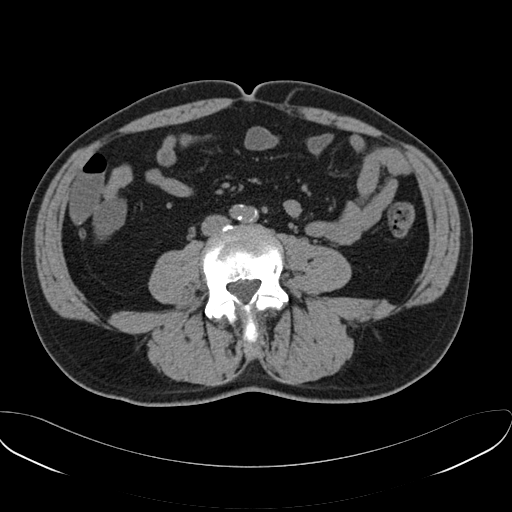
[im 91/162  soft-tissue]
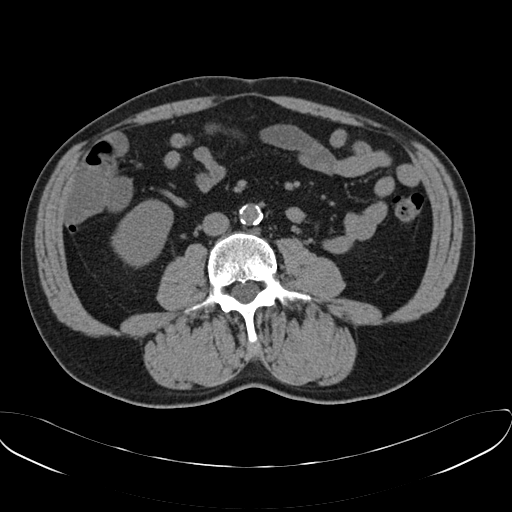
[im 104/162  soft-tissue]
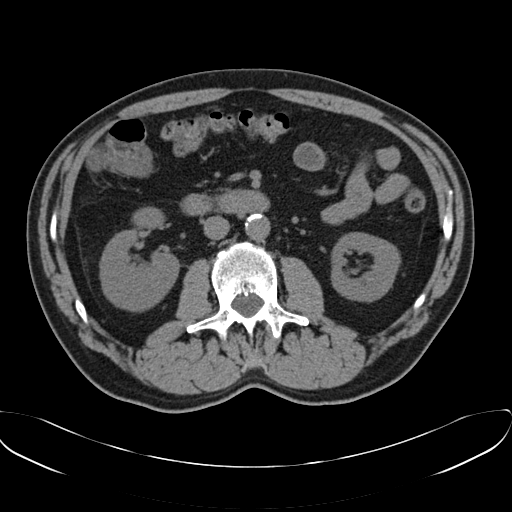
[im 104/162  bone]
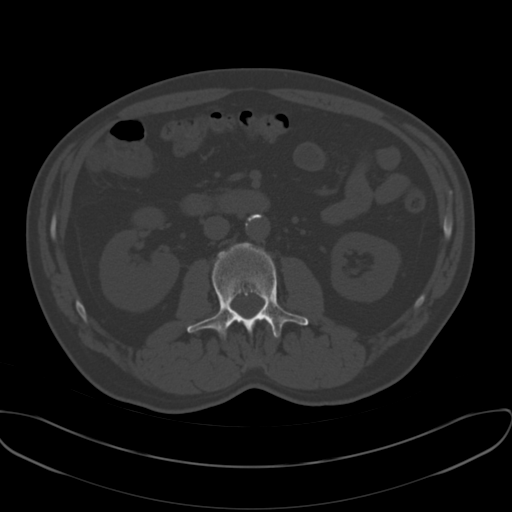
[im 116/162  soft-tissue]
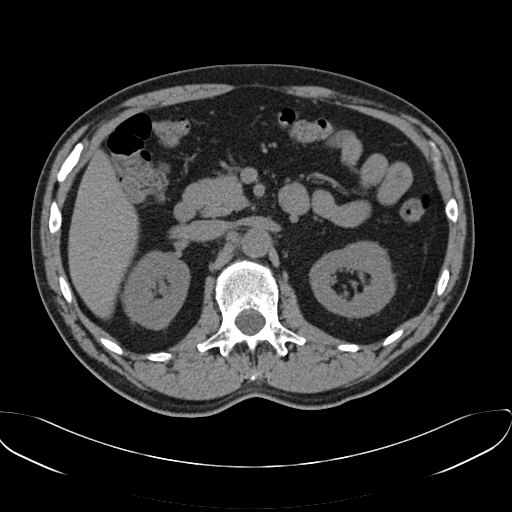
[im 129/162  soft-tissue]
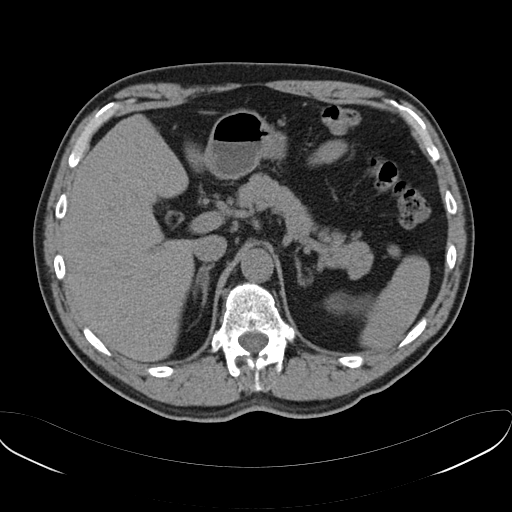
[im 136/162  lung]
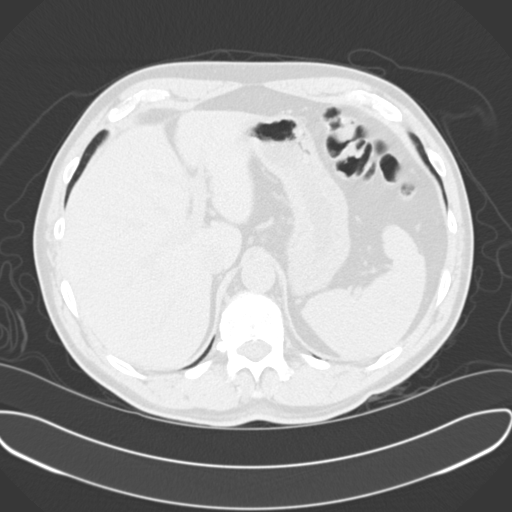
[im 142/162  soft-tissue]
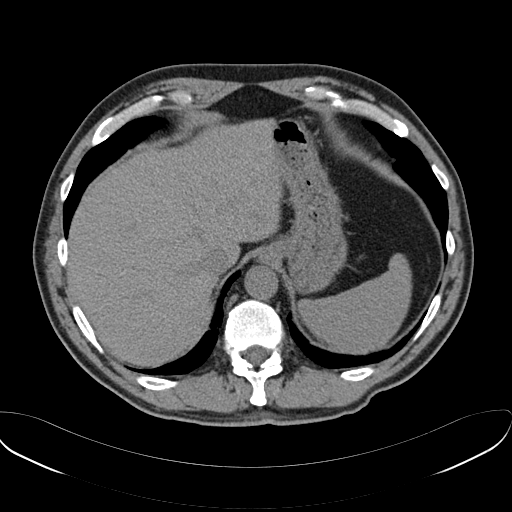
[im 142/162  lung]
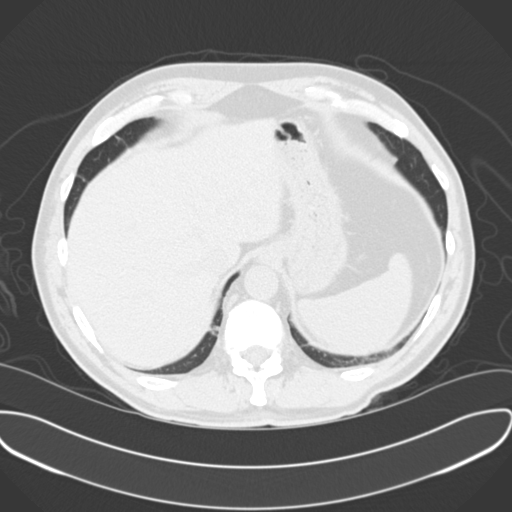
[im 149/162  lung]
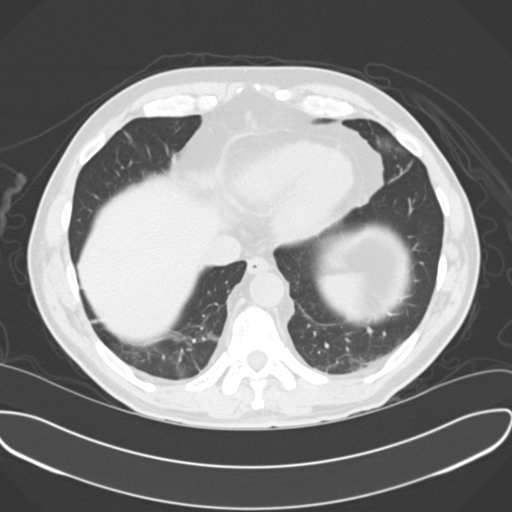
[im 155/162  soft-tissue]
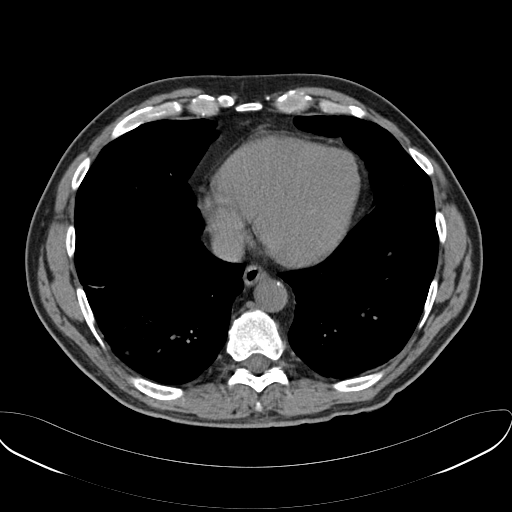
[im 155/162  lung]
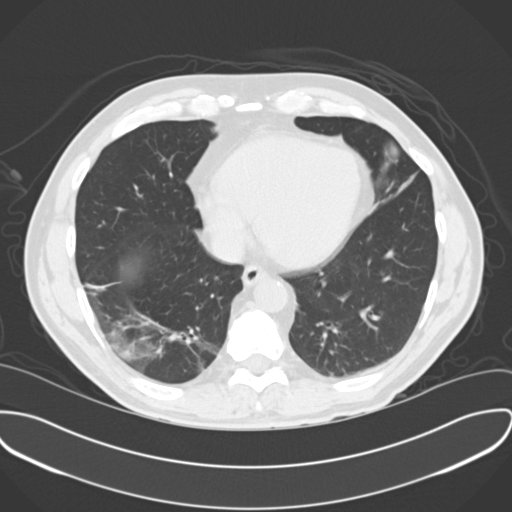

[15 of 32 positions shown; findings below may reference images not displayed]

PROCEDURE:     CT  - CT ABDOMEN /PELVIS WO (STONE)  - September 11, 2012  [DATE]

RESULT:     CT of the abdomen and pelvis is performed emergently utilizing a
noncontrast technique with images reconstructed at 3 mm slice thickness in
the axial and coronal planes. The patient has a previous multiphasic CT of
the abdomen and pelvis which is not available in the PACS system. There is a
previous CT angiogram of the abdomen dated 04/28/2010.

The lung bases demonstrate what appears to be areas of atelectasis in the
lingula and right lower lobe and to lesser extend in the left lower lobe.
There is no nephrolithiasis or hydronephrosis. No discrete renal masses
appreciated. There is a duplicated right renal collecting system with 2
ureters proximally there appear to be 2 ureters in the pelvis on image 123.
It is uncertain if there is a single ureteral orifice on this exam. There is
no hydronephrosis. A normal appearing appendix is present. There are small
fatty attenuation areas in the adrenal glands suggestive of fat which
adrenal adenomas. No radiopaque gallstones are evident. The liver, spleen,
pancreas and abdominal aorta appear to be within normal limits with
atherosclerotic calcification present. There is no evidence of ascites or
abnormal fluid collection. The urinary bladder is unremarkable. The
abdominal wall is intact. The bony structures appear unremarkable.
IMPRESSION: 1. Duplicated collecting system and 2 ureters into the right renal pelvis.
No evidence of nephrolithiasis or hydronephrosis. The no ureterolithiasis or
bladder calculi are evident. The
2. Normal appearing appendix.
3. Areas of atelectasis or fibrosis in the lung bases bilaterally.

[REDACTED]

## 2015-05-12 ENCOUNTER — Encounter: Payer: 59 | Admitting: Internal Medicine

## 2015-05-15 ENCOUNTER — Other Ambulatory Visit: Payer: Self-pay | Admitting: Internal Medicine

## 2015-05-17 ENCOUNTER — Encounter: Payer: 59 | Admitting: Internal Medicine

## 2015-05-26 ENCOUNTER — Encounter: Payer: Self-pay | Admitting: Internal Medicine

## 2015-05-26 ENCOUNTER — Ambulatory Visit (INDEPENDENT_AMBULATORY_CARE_PROVIDER_SITE_OTHER): Payer: 59 | Admitting: Internal Medicine

## 2015-05-26 VITALS — BP 138/84 | HR 64 | Temp 97.6°F | Ht 71.0 in | Wt 181.0 lb

## 2015-05-26 DIAGNOSIS — I2510 Atherosclerotic heart disease of native coronary artery without angina pectoris: Secondary | ICD-10-CM

## 2015-05-26 DIAGNOSIS — Z0000 Encounter for general adult medical examination without abnormal findings: Secondary | ICD-10-CM

## 2015-05-26 DIAGNOSIS — R1011 Right upper quadrant pain: Secondary | ICD-10-CM | POA: Diagnosis not present

## 2015-05-26 HISTORY — DX: Atherosclerotic heart disease of native coronary artery without angina pectoris: I25.10

## 2015-05-26 MED ORDER — ATORVASTATIN CALCIUM 40 MG PO TABS: 40.0000 mg | ORAL_TABLET | Freq: Every day | ORAL | Status: DC

## 2015-05-26 NOTE — Assessment & Plan Note (Signed)
History suggests it is rib related but could be gallbladder and has not ureteral abnormality on the right Will check ultrasound

## 2015-05-26 NOTE — Progress Notes (Signed)
Subjective:    Patient ID: Justin Terrell, male    DOB: 07-04-1955, 60 y.o.   MRN: [id number]  HPI Here for physical  Had "mild heart attack"  Reviewed cath report Was on statin--but stopped it about 3 months ago Walks dog multiple times a day--fast. 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 aspirin EC 81 MG tablet Take 81 mg by mouth daily.    [name] Kitchen lisinopril (PRINIVIL,ZESTRIL) 10 MG tablet TAKE ONE TABLET BY MOUTH ONCE DAILY 90 tablet 0  . metoprolol succinate (TOPROL-XL) 25 MG 24 hr tablet Take 1 tablet (25 mg total) by mouth daily. 30 tablet 0   No current facility-administered medications on file prior to visit.    Allergies  Allergen Reactions  . Codeine Nausea And Vomiting  . Pravastatin Other (See Comments)    Reaction:  Chest pain     Past Medical History  Diagnosis Date  . Hypertension   . Hyperlipidemia   . Lumbar disc disease   . GERD (gastroesophageal reflux disease)     aspirin sensitive, prn meds only  . AVM (arteriovenous malformation) of ascending colon 12/25/2012  . Personal history of colonic adenoma 12/30/2012    Past Surgical History  Procedure Laterality Date  . Lumbar disc surgery  1993  . Right leg compound fracture repair  1976  . Hand nerve repair  1970's    both hands  . Knee arthroscopy  1990's    right   . Doppler echocardiography  06/24/07    stress echo normal. Nl baseline EF  . Back surgery    . Angioplasty      thigh  . Cardiac catheterization N/A 07/05/2014    Procedure: Left Heart Cath and Coronary Angiography;  Surgeon: Isaias Cowman, MD;   Location: [facility];  Service: Cardiovascular;  Laterality: N/A;    Family History  Problem Relation Age of Onset  . Heart disease Father   . Hypertension Father   . Diabetes Neg Hx   . Cancer Mother     colon cancer  . Colon cancer Mother 45  . Coronary artery disease Father   . Valvular heart disease Father     Social History   Social History  . Marital Status: Married    Spouse Name: N/A  . Number of Children: 0  . 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 Smoking status: Current Some Day Smoker -- 0.10 packs/day    Types: Cigarettes    Last Attempt to Quit: 02/25/2009  . Smokeless tobacco: Never Used  . Alcohol Use: No  . Drug Use: No  . Sexual Activity: Not on file   Other Topics Concern  . Not on file   Social History Narrative   2nd marriage   Wife has 2 children (1 died)   Works in [employer]  Constitutional: Negative for fatigue and unexpected weight change.       Wears seat belt  HENT: Negative for dental problem, hearing  loss and tinnitus.        Overdue for dentist  Eyes: Negative for visual disturbance.       No diplopia or unilateral vision loss  Respiratory: Negative for cough, chest tightness and shortness of breath.   Cardiovascular: Negative for chest pain, palpitations and leg swelling.  Gastrointestinal: Negative for nausea, vomiting, abdominal pain and blood in stool.       No heartburn  Endocrine: Negative for polydipsia and polyuria.  Genitourinary:       Weak stream No sexual problems  Musculoskeletal: Positive for back pain. Negative for joint swelling and arthralgias.       No meds for this  Skin: Negative for rash.       No suspicious lesions  Allergic/Immunologic: Positive for environmental allergies. Negative for immunocompromised state.  Neurological: Negative for dizziness, syncope, weakness, light-headedness and headaches.    Hematological: Negative for adenopathy. Does not bruise/bleed easily.  Psychiatric/Behavioral: Positive for sleep disturbance. Negative for dysphoric mood. The patient is not nervous/anxious.        Disturbed at night due to wife's cough       Objective:   Physical Exam  Constitutional: He is oriented to person, place, and time. He appears well-developed and well-nourished. No distress.  HENT:  Head: Normocephalic and atraumatic.  Right Ear: External ear normal.  Left Ear: External ear normal.  Mouth/Throat: Oropharynx is clear and moist. No oropharyngeal exudate.  Eyes: Conjunctivae are normal. Pupils are equal, round, and reactive to light.  Neck: Normal range of motion. Neck supple. No thyromegaly present.  Cardiovascular: Normal rate, regular rhythm, normal heart sounds and intact distal pulses.  Exam reveals no gallop.   No murmur heard. Pulmonary/Chest: Effort normal and breath sounds normal. No respiratory distress. He has no wheezes. He has no rales.  Slight sensitivity in right lower ribs  Abdominal: Soft.  Some tenderness up under right rib cage  Musculoskeletal: He exhibits no edema or tenderness.  Lymphadenopathy:    He has no cervical adenopathy.  Neurological: He is alert and oriented to person, place, and time.  Skin: No rash noted. No erythema.  Psychiatric: He has a normal mood and affect. His behavior is normal.          Assessment & Plan:

## 2015-05-26 NOTE — Progress Notes (Signed)
Pre visit review using our clinic review tool, if applicable. No additional management support is needed unless otherwise documented below in the visit note. 

## 2015-05-26 NOTE — Patient Instructions (Signed)
Restart the atorvastatin and stay on it.  Come back for lab work in about 2 months

## 2015-05-26 NOTE — Assessment & Plan Note (Signed)
On appropriate meds except the statin Urged him to stop all cigarettes (one a week only) Must take the statin if tolerated

## 2015-05-26 NOTE — Assessment & Plan Note (Signed)
Defer PSA to at least next year Colon due 2019 Yearly flu vaccine

## 2015-05-29 ENCOUNTER — Ambulatory Visit
Admission: RE | Admit: 2015-05-29 | Discharge: 2015-05-29 | Disposition: A | Discharge status: Home / Self Care | Payer: 59 | Source: Ambulatory Visit | Attending: Internal Medicine | Admitting: Internal Medicine

## 2015-05-29 DIAGNOSIS — R1011 Right upper quadrant pain: Secondary | ICD-10-CM | POA: Diagnosis not present

## 2015-06-13 ENCOUNTER — Telehealth: Payer: Self-pay | Admitting: Internal Medicine

## 2015-06-13 ENCOUNTER — Ambulatory Visit: Payer: Self-pay | Admitting: Family Medicine

## 2015-06-13 NOTE — Telephone Encounter (Signed)
Pt has appt with Dr Damita Dunnings 06/13/15 at 10:15.

## 2015-06-13 NOTE — Telephone Encounter (Signed)
[facility]  Patient Name: ZEV DIZON  DOB: [birth year]    Initial Comment caller states her husband is c/o right side abd pain - also has been having vomiting and diarrhea -    Nurse Assessment  Nurse: Wynetta Emery, RN, Baker Janus Date/Time (Eastern Time): 06/13/2015 8:50:51 AM  Confirm and document reason for call. If symptomatic, describe symptoms. You must click the next button to save text entered. ---Jenny Reichmann has been vomiting and having diarrhea yesterday had this three/four weeks ago US done and normal findings per website --- has right side abdominal pain  Has the patient traveled out of the country within the last 30 days? ---No  Does the patient have any new or worsening symptoms? ---Yes  Will a triage be completed? ---Yes  Related visit to physician within the last 2 weeks? ---Yes  Does the PT have any chronic conditions? (i.e. diabetes, asthma, etc.) ---Unknown  Is this a behavioral health or substance abuse call? ---No     Guidelines    Guideline Title Affirmed Question Affirmed Notes  Vomiting [1] Constant abdominal pain AND [2] present > 2 hours    Final Disposition User   See Physician within 4 Hours (or PCP triage) Wynetta Emery, RN, Baker Janus    Comments  NOTE; APPT UNAVAILABLE WITH DR. Silvio Pate APPT GIVEN AT 1015 WITH DR. Damita Dunnings TODAY   Referrals  REFERRED TO PCP OFFICE

## 2015-06-13 NOTE — Telephone Encounter (Signed)
Cancelled and rescheduled

## 2015-06-14 ENCOUNTER — Other Ambulatory Visit: Payer: Self-pay | Admitting: Family Medicine

## 2015-06-14 ENCOUNTER — Encounter: Payer: Self-pay | Admitting: Family Medicine

## 2015-06-14 ENCOUNTER — Ambulatory Visit (INDEPENDENT_AMBULATORY_CARE_PROVIDER_SITE_OTHER): Payer: 59 | Admitting: Family Medicine

## 2015-06-14 VITALS — BP 118/64 | HR 84 | Temp 97.9°F | Wt 177.2 lb

## 2015-06-14 DIAGNOSIS — R1011 Right upper quadrant pain: Secondary | ICD-10-CM | POA: Diagnosis not present

## 2015-06-14 DIAGNOSIS — R1013 Epigastric pain: Secondary | ICD-10-CM

## 2015-06-14 DIAGNOSIS — R197 Diarrhea, unspecified: Secondary | ICD-10-CM | POA: Diagnosis not present

## 2015-06-14 DIAGNOSIS — R1110 Vomiting, unspecified: Secondary | ICD-10-CM

## 2015-06-14 MED ORDER — ONDANSETRON HCL 4 MG PO TABS: 4.0000 mg | ORAL_TABLET | Freq: Three times a day (TID) | ORAL | Status: DC | PRN

## 2015-06-14 NOTE — Addendum Note (Signed)
Addended by: Daralene Milch C on: 06/14/2015 11:26 AM   Modules accepted: Orders

## 2015-06-14 NOTE — Progress Notes (Signed)
Subjective:   Patient ID: Justin Terrell, male    DOB: [birth year], 60 y.o.   MRN: [id number]  Justin Terrell is a pleasant 60 y.o. year old male pt of Dr. Silvio Pate, new to me, who presents to clinic today with Emesis and Diarrhea  on 06/14/2015  HPI:   Saw PCP on 05/26/15 and complained of 6 months of RUQ pain.  Notes and studies reviewed. The week prior to this OV, he did complain of vomiting and diarrhea. [place] of abdomen done on 05/29/15 was negative.  Symptoms resolved until a few days ago, now having RUQ now lower abdominal pain with vomiting and diarrhea.  Last vomited last night.  He was able to keep food down this morning.  Having some body aches as well.  Still nauseated.  Pain does not seem to be worsened or alleviated by food.   Current Outpatient Prescriptions on File Prior to Visit  Medication Sig Dispense Refill  . aspirin EC 81 MG tablet Take 81 mg by mouth daily.    [name] Kitchen atorvastatin (LIPITOR) 40 MG tablet Take 1 tablet (40 mg total) by mouth daily. 90 tablet 3  . lisinopril (PRINIVIL,ZESTRIL) 10 MG tablet TAKE ONE TABLET BY MOUTH ONCE DAILY 90 tablet 0  . metoprolol succinate (TOPROL-XL) 25 MG 24 hr tablet Take 1 tablet (25 mg total) by mouth daily. 30 tablet 0  . nitroGLYCERIN (NITROSTAT) 0.4 MG SL tablet Place 1 tablet under the tongue.     No current facility-administered medications on file prior to visit.    Allergies  Allergen Reactions  . Codeine Nausea And Vomiting  . Pravastatin Other (See Comments)    Reaction:  Chest pain     Past Medical History  Diagnosis Date  . Hypertension   . Hyperlipidemia   . Lumbar disc disease   . GERD (gastroesophageal reflux disease)     aspirin sensitive, prn meds only  . AVM (arteriovenous malformation) of ascending colon 12/25/2012  . Personal history of colonic adenoma 12/30/2012  . Coronary atherosclerosis of native coronary artery 05/26/2015    Past Surgical History  Procedure Laterality Date  . Lumbar disc  surgery  1993  . Right leg compound fracture repair  1976  . Hand nerve repair  1970's    both hands  . Knee arthroscopy  1990's    right   . Doppler echocardiography  06/24/07    stress echo normal. Nl baseline EF  . Back surgery    . Angioplasty      thigh  . Cardiac catheterization N/A 07/05/2014    Procedure: Left Heart Cath and Coronary Angiography;  Surgeon: Isaias Cowman, MD;  Location: [facility];  Service: Cardiovascular;  Laterality: N/A;    Family History  Problem Relation Age of Onset  . Heart disease Father   . Hypertension Father   . Coronary artery disease Father   . Valvular heart disease Father   . Diabetes Neg Hx   . Cancer Mother     colon cancer  . Colon cancer Mother 83  . Throat cancer Mother     Social History   Social History  . Marital Status: Married    Spouse Name: N/A  . Number of Children: 0  . 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 Smoking status: Current Some Day Smoker -- 0.10 packs/day    Types: Cigarettes  Last Attempt to Quit: 02/25/2009  . Smokeless tobacco: Never Used  . Alcohol Use: No  . Drug Use: No  . Sexual Activity: Not on file   Other Topics Concern  . Not on file   Social History Narrative   2nd marriage   Wife has 2 children (1 died)   Works in Engineer, technical sales   [employer] [place], [place], Social History, Family History, Medications, and allergies have been reviewed in [facility], and have been updated if relevant.   Review of Systems  Constitutional: Positive for fatigue. Negative for fever.  Gastrointestinal: Positive for nausea, vomiting, abdominal pain and diarrhea. Negative for constipation, blood in stool and rectal pain.  Endocrine: Negative.   Genitourinary: Negative.   Skin: Negative.   Neurological: Negative.   Hematological: Positive for adenopathy.  Psychiatric/Behavioral: Negative.   All other systems reviewed and are negative.        Objective:    BP 118/64 mmHg  Pulse 84  Temp(Src) 97.9 F (36.6 C) (Oral)  Wt 177 lb 4 oz (80.4 kg)  SpO2 97%  Wt Readings from Last 3 Encounters:  06/14/15 177 lb 4 oz (80.4 kg)  05/26/15 181 lb (82.101 kg)  07/06/14 179 lb 6.4 oz (81.375 kg)    Physical Exam  Constitutional: He is oriented to person, place, and time. He appears well-developed and well-nourished. No distress.  HENT:  Head: Normocephalic.  Eyes: Conjunctivae are normal.  Cardiovascular: Normal rate.   Pulmonary/Chest: Effort normal.  Abdominal: Soft. He exhibits no distension and no mass. There is tenderness. There is no rebound and no guarding.  Neurological: He is alert and oriented to person, place, and time. No cranial nerve deficit.  Skin: Skin is warm and dry. He is not diaphoretic.  Psychiatric: He has a normal mood and affect. His behavior is normal. Judgment and thought content normal.          Assessment & Plan:   No diagnosis found. No Follow-up on file.

## 2015-06-14 NOTE — Progress Notes (Signed)
Pre visit review using our clinic review tool, if applicable. No additional management support is needed unless otherwise documented below in the visit note. 

## 2015-06-14 NOTE — Assessment & Plan Note (Signed)
Persistent, associated with vomiting and diarrhea,  and now also involving lower abdomen and epigastric area on exam. Neg [place] earlier this month. Labs today- Lipase, CBC, CMET, H pylori CT of abdomen and pelvis eRx sent for zofran to use prn nausea. The patient indicates understanding of these issues and agrees with the plan.

## 2015-06-14 NOTE — Patient Instructions (Signed)
Nice to meet you.  Please stop by to see Rosaria Ferries on your way out.

## 2015-06-15 ENCOUNTER — Other Ambulatory Visit: Payer: Self-pay | Admitting: Family Medicine

## 2015-06-15 LAB — CBC WITH DIFFERENTIAL/PLATELET
BASOS ABS: 0 10*3/uL (ref 0.0–0.2)
Basos: 0 %
EOS (ABSOLUTE): 0.1 10*3/uL (ref 0.0–0.4)
Eos: 1 %
Hematocrit: 46.4 % (ref 37.5–51.0)
Hemoglobin: 16 g/dL (ref 12.6–17.7)
IMMATURE GRANULOCYTES: 0 %
Immature Grans (Abs): 0 10*3/uL (ref 0.0–0.1)
LYMPHS: 21 %
Lymphocytes Absolute: 2.2 10*3/uL (ref 0.7–3.1)
MCH: 31.5 pg (ref 26.6–33.0)
MCHC: 34.5 g/dL (ref 31.5–35.7)
MCV: 91 fL (ref 79–97)
MONOS ABS: 0.7 10*3/uL (ref 0.1–0.9)
Monocytes: 7 %
NEUTROS PCT: 71 %
Neutrophils Absolute: 7.3 10*3/uL — ABNORMAL HIGH (ref 1.4–7.0)
PLATELETS: 310 10*3/uL (ref 150–379)
RBC: 5.08 x10E6/uL (ref 4.14–5.80)
RDW: 14.2 % (ref 12.3–15.4)
WBC: 10.4 10*3/uL (ref 3.4–10.8)

## 2015-06-15 LAB — COMPREHENSIVE METABOLIC PANEL
ALK PHOS: 97 IU/L (ref 39–117)
ALT: 10 IU/L (ref 0–44)
AST: 13 IU/L (ref 0–40)
Albumin/Globulin Ratio: 1.8 (ref 1.2–2.2)
Albumin: 4.2 g/dL (ref 3.5–5.5)
BILIRUBIN TOTAL: 0.4 mg/dL (ref 0.0–1.2)
BUN/Creatinine Ratio: 10 (ref 9–20)
BUN: 10 mg/dL (ref 6–24)
CHLORIDE: 95 mmol/L — AB (ref 96–106)
CO2: 25 mmol/L (ref 18–29)
Calcium: 9.3 mg/dL (ref 8.7–10.2)
Creatinine, Ser: 1.03 mg/dL (ref 0.76–1.27)
GFR calc Af Amer: 91 mL/min/{1.73_m2} (ref >59)
GFR calc non Af Amer: 79 mL/min/{1.73_m2} (ref >59)
GLUCOSE: 101 mg/dL — AB (ref 65–99)
Globulin, Total: 2.4 g/dL (ref 1.5–4.5)
Potassium: 4.7 mmol/L (ref 3.5–5.2)
Sodium: 137 mmol/L (ref 134–144)
TOTAL PROTEIN: 6.6 g/dL (ref 6.0–8.5)

## 2015-06-15 LAB — H. PYLORI ANTIBODY, IGG: H Pylori IgG: <0.9 U/mL (ref 0.0–0.8)

## 2015-06-15 LAB — LIPASE: LIPASE: 71 U/L — AB (ref 0–59)

## 2015-06-16 ENCOUNTER — Ambulatory Visit
Admission: RE | Admit: 2015-06-16 | Discharge: 2015-06-16 | Disposition: A | Discharge status: Home / Self Care | Payer: 59 | Source: Ambulatory Visit | Attending: Family Medicine | Admitting: Family Medicine

## 2015-06-16 DIAGNOSIS — D3501 Benign neoplasm of right adrenal gland: Secondary | ICD-10-CM | POA: Diagnosis not present

## 2015-06-16 DIAGNOSIS — D3502 Benign neoplasm of left adrenal gland: Secondary | ICD-10-CM | POA: Diagnosis not present

## 2015-06-16 DIAGNOSIS — R1013 Epigastric pain: Secondary | ICD-10-CM | POA: Diagnosis not present

## 2015-06-16 DIAGNOSIS — I700 Atherosclerosis of aorta: Secondary | ICD-10-CM | POA: Insufficient documentation

## 2015-06-16 MED ORDER — IOPAMIDOL (ISOVUE-300) INJECTION 61%
100.0000 mL | Freq: Once | INTRAVENOUS | Status: AC | PRN
  Administered 2015-06-16: 100 mL via INTRAVENOUS

## 2015-07-26 ENCOUNTER — Other Ambulatory Visit: Payer: 59

## 2015-08-16 ENCOUNTER — Other Ambulatory Visit: Payer: Self-pay | Admitting: Internal Medicine

## 2015-12-25 ENCOUNTER — Encounter: Payer: Self-pay | Admitting: Primary Care

## 2015-12-25 ENCOUNTER — Ambulatory Visit (INDEPENDENT_AMBULATORY_CARE_PROVIDER_SITE_OTHER): Payer: 59 | Admitting: Primary Care

## 2015-12-25 ENCOUNTER — Ambulatory Visit (INDEPENDENT_AMBULATORY_CARE_PROVIDER_SITE_OTHER)
Admission: RE | Admit: 2015-12-25 | Discharge: 2015-12-25 | Disposition: A | Discharge status: Home / Self Care | Payer: 59 | Source: Ambulatory Visit | Attending: Primary Care | Admitting: Primary Care

## 2015-12-25 VITALS — BP 140/82 | HR 71 | Temp 97.8°F | Ht 71.0 in | Wt 184.0 lb

## 2015-12-25 DIAGNOSIS — R059 Cough, unspecified: Secondary | ICD-10-CM

## 2015-12-25 DIAGNOSIS — R05 Cough: Secondary | ICD-10-CM

## 2015-12-25 NOTE — Patient Instructions (Signed)
Complete xray(s) prior to leaving today. I will notify you of your results once received.  Try Delsym DM for cough and mucous production.  Antihistamines such as Claritin, Zyrtec, and Allegra can help to dry up secretions.  Nasal Congestion: Try using Flonase (fluticasone) nasal spray. Instill 1 spray in each nostril twice daily.   Please notify myself or Dr. Silvio Pate if your cough lingers longer than 1 week from today.  It was a pleasure meeting you!

## 2015-12-25 NOTE — Progress Notes (Signed)
Pre visit review using our clinic review tool, if applicable. No additional management support is needed unless otherwise documented below in the visit note. 

## 2015-12-25 NOTE — Progress Notes (Signed)
Subjective:    Patient ID: Justin Terrell, male    DOB: 22-Dec-1955, 60 y.o.   MRN: [id number]  HPI  Justin Terrell is a 60 year old male who presents today for Urgent Care Follow up.  He presented to [facility] on 12/04/15 with a chief complaint of cough. He also reported shortness of breath, nasal congestion. He underwent chest xray and was diagnosed with bronchitis. He was provided with prescriptions for Tessalon Pearls, Hycodan, Albuterol Inhaler, and antibiotics (cannot remember the name). He completed a 10 day course of the antibiotics over 1 week ago.   Overall he's feeling improved, but continues to cough (productive with white sputum), wheeze when coughing, and experiences nasal congestion. Denies fevers, chills, shortness of breath for the most part. He is using his albuterol inhaler 4-5 times weekly. He's also taking Alka-Seltzer cough and cold without much improvement.   Review of Systems  Constitutional: Negative for chills, fatigue and fever.  HENT: Positive for congestion. Negative for ear pain, sinus pressure and sore throat.   Respiratory: Positive for cough and wheezing. Negative for shortness of breath.   Cardiovascular: Negative for chest pain.       Past Medical History:  Diagnosis Date  . AVM (arteriovenous malformation) of ascending colon 12/25/2012  . Coronary atherosclerosis of native coronary artery 05/26/2015  . GERD (gastroesophageal reflux disease)    aspirin sensitive, prn meds only  . Hyperlipidemia   . Hypertension   . Lumbar disc disease   . Personal history of colonic adenoma 12/30/2012     Social History   Social History  . Marital status: Married    Spouse name: N/A  . Number of children: 0  . Years of education: N/A   Occupational History  . 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 Smoking status: Former Smoker    Packs/day: 0.10    Types: Cigarettes    Quit date: 02/25/2009  . Smokeless tobacco: Never Used    . Alcohol use No  . Drug use: No  . Sexual activity: Not on file   Other Topics Concern  . Not on file   Social History Narrative   2nd marriage   Wife has 2 children (1 died)   Works in IT    Past Surgical History:  Procedure Laterality Date  . ANGIOPLASTY     thigh  . BACK SURGERY    . CARDIAC CATHETERIZATION N/A 07/05/2014   Procedure: Left Heart Cath and Coronary Angiography;  Surgeon: Isaias Cowman, MD;  Location: [facility];  Service: Cardiovascular;  Laterality: N/A;  . DOPPLER ECHOCARDIOGRAPHY  06/24/07   stress echo normal. Nl baseline EF  . HAND NERVE REPAIR  1970's   both hands  . KNEE ARTHROSCOPY  1990's   right   . [place] SURGERY  1993  . Right leg compound fracture repair  1976    Family History  Problem Relation Age of Onset  . Heart disease Father   . Hypertension Father   . Coronary artery disease Father   . Valvular heart disease Father   . Diabetes Neg Hx   . Cancer Mother     colon cancer  . Colon cancer Mother 23  . Throat cancer Mother     Allergies  Allergen Reactions  . Codeine Nausea And Vomiting  . Pravastatin Other (See Comments)    Reaction:  Chest pain     Current Outpatient Prescriptions on File  Prior to Visit  Medication Sig Dispense Refill  . aspirin EC 81 MG tablet Take 81 mg by mouth daily.    [name] Kitchen atorvastatin (LIPITOR) 40 MG tablet Take 1 tablet (40 mg total) by mouth daily. 90 tablet 3  . lisinopril (PRINIVIL,ZESTRIL) 10 MG tablet TAKE ONE TABLET BY MOUTH ONCE DAILY 90 tablet 2  . metoprolol succinate (TOPROL-XL) 25 MG 24 hr tablet Take 1 tablet (25 mg total) by mouth daily. 30 tablet 0   No current facility-administered medications on file prior to visit.     BP 140/82   Pulse 71   Temp 97.8 F (36.6 C) (Oral)   Ht 5\' 11"  (1.803 m)   Wt 184 lb (83.5 kg)   SpO2 98%   BMI 25.66 kg/m    Objective:   Physical Exam  Constitutional: He appears well-nourished. He does not appear ill.  HENT:   Right Ear: Tympanic membrane and ear canal normal.  Left Ear: Tympanic membrane and ear canal normal.  Nose: No mucosal edema. Right sinus exhibits no maxillary sinus tenderness and no frontal sinus tenderness. Left sinus exhibits no maxillary sinus tenderness and no frontal sinus tenderness.  Mouth/Throat: Oropharynx is clear and moist.  Eyes: Conjunctivae are normal.  Neck: Neck supple.  Cardiovascular: Normal rate and regular rhythm.   Pulmonary/Chest: Effort normal and breath sounds normal. He has no wheezes. He has no rales.  Skin: Skin is warm and dry.          Assessment & Plan:  Urgent Care Follow Up:  Treated at [facility] on 12/04/15 for acute bronchitis. Completed antibiotics 1 week ago.  Overall feeling better except for lingering cough. Exam today unremarkable. Clear lungs, no evidence of bacterial involvement, does not appear acutely ill. Dry cough noted during exam (managed on ACE without prior problems). Suspect post bronchitic cough at this point.  Will repeat chest xray for follow up given 4 weeks post treatment. Discussed supportive care including antihistamines, Flonase, Delsym. Return precautions provided. Stable for outpatient treatment.  Sheral Flow, NP

## 2016-05-08 ENCOUNTER — Other Ambulatory Visit: Payer: Self-pay | Admitting: Internal Medicine

## 2016-05-31 ENCOUNTER — Encounter: Payer: 59 | Admitting: Internal Medicine

## 2016-07-23 ENCOUNTER — Encounter: Payer: 59 | Admitting: Internal Medicine

## 2016-08-08 ENCOUNTER — Other Ambulatory Visit: Payer: Self-pay | Admitting: Internal Medicine

## 2016-08-12 ENCOUNTER — Encounter: Payer: Self-pay | Admitting: Internal Medicine

## 2016-08-12 ENCOUNTER — Ambulatory Visit (INDEPENDENT_AMBULATORY_CARE_PROVIDER_SITE_OTHER): Payer: 59 | Admitting: Internal Medicine

## 2016-08-12 ENCOUNTER — Other Ambulatory Visit: Payer: Self-pay

## 2016-08-12 VITALS — BP 144/76 | HR 76 | Temp 98.0°F | Ht 71.25 in | Wt 178.5 lb

## 2016-08-12 DIAGNOSIS — I2510 Atherosclerotic heart disease of native coronary artery without angina pectoris: Secondary | ICD-10-CM | POA: Diagnosis not present

## 2016-08-12 DIAGNOSIS — J0100 Acute maxillary sinusitis, unspecified: Secondary | ICD-10-CM

## 2016-08-12 DIAGNOSIS — Z0000 Encounter for general adult medical examination without abnormal findings: Secondary | ICD-10-CM

## 2016-08-12 DIAGNOSIS — E785 Hyperlipidemia, unspecified: Secondary | ICD-10-CM

## 2016-08-12 DIAGNOSIS — Z125 Encounter for screening for malignant neoplasm of prostate: Secondary | ICD-10-CM | POA: Diagnosis not present

## 2016-08-12 DIAGNOSIS — I10 Essential (primary) hypertension: Secondary | ICD-10-CM

## 2016-08-12 MED ORDER — AMOXICILLIN 500 MG PO TABS: 1000.0000 mg | ORAL_TABLET | Freq: Two times a day (BID) | ORAL | 0 refills | Status: AC

## 2016-08-12 MED ORDER — LISINOPRIL 20 MG PO TABS: 20.0000 mg | ORAL_TABLET | Freq: Every day | ORAL | 3 refills | Status: DC

## 2016-08-12 NOTE — Assessment & Plan Note (Addendum)
BP Readings from Last 3 Encounters:  08/12/16 (!) 144/76  12/25/15 140/82  06/14/15 118/64   Generally fair but now increased Will increase the lisinopril

## 2016-08-12 NOTE — Assessment & Plan Note (Signed)
Worsening after 10 days Will try amoxil

## 2016-08-12 NOTE — Assessment & Plan Note (Signed)
Inconsistent with statin Discussed Now takes about 1/2 the time--discussed increasing

## 2016-08-12 NOTE — Assessment & Plan Note (Signed)
No clear cut angina Keeps up with cardiology

## 2016-08-12 NOTE — Addendum Note (Signed)
Addended by: Ellamae Sia on: 08/12/2016 03:23 PM   Modules accepted: Orders
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
## 2016-08-12 NOTE — Assessment & Plan Note (Signed)
Healthy but should work on fitness---does walk dog and working on [employer] Discussed PSA-- will check Colon due next year Should get prevnar--but sick today

## 2016-08-13 ENCOUNTER — Telehealth: Payer: Self-pay | Admitting: Internal Medicine

## 2016-08-13 ENCOUNTER — Other Ambulatory Visit: Payer: Self-pay | Admitting: Internal Medicine

## 2016-08-13 DIAGNOSIS — R972 Elevated prostate specific antigen [PSA]: Secondary | ICD-10-CM

## 2016-08-13 LAB — COMPREHENSIVE METABOLIC PANEL
A/G RATIO: 1.7 (ref 1.2–2.2)
ALK PHOS: 99 IU/L (ref 39–117)
ALT: 22 IU/L (ref 0–44)
AST: 14 IU/L (ref 0–40)
Albumin: 4.1 g/dL (ref 3.6–4.8)
BILIRUBIN TOTAL: 0.3 mg/dL (ref 0.0–1.2)
BUN/Creatinine Ratio: 18 (ref 10–24)
BUN: 16 mg/dL (ref 8–27)
CALCIUM: 9 mg/dL (ref 8.6–10.2)
CHLORIDE: 103 mmol/L (ref 96–106)
CO2: 25 mmol/L (ref 20–29)
Creatinine, Ser: 0.91 mg/dL (ref 0.76–1.27)
GFR calc Af Amer: 106 mL/min/{1.73_m2} (ref >59)
GFR calc non Af Amer: 91 mL/min/{1.73_m2} (ref >59)
GLOBULIN, TOTAL: 2.4 g/dL (ref 1.5–4.5)
Glucose: 59 mg/dL — ABNORMAL LOW (ref 65–99)
POTASSIUM: 4.4 mmol/L (ref 3.5–5.2)
SODIUM: 145 mmol/L — AB (ref 134–144)
Total Protein: 6.5 g/dL (ref 6.0–8.5)

## 2016-08-13 LAB — CBC WITH DIFFERENTIAL/PLATELET
BASOS: 1 %
Basophils Absolute: 0 10*3/uL (ref 0.0–0.2)
EOS (ABSOLUTE): 0.4 10*3/uL (ref 0.0–0.4)
EOS: 5 %
HEMATOCRIT: 43.5 % (ref 37.5–51.0)
Hemoglobin: 15.3 g/dL (ref 13.0–17.7)
IMMATURE GRANULOCYTES: 0 %
Immature Grans (Abs): 0 10*3/uL (ref 0.0–0.1)
LYMPHS ABS: 2.5 10*3/uL (ref 0.7–3.1)
Lymphs: 32 %
MCH: 31.4 pg (ref 26.6–33.0)
MCHC: 35.2 g/dL (ref 31.5–35.7)
MCV: 89 fL (ref 79–97)
Monocytes Absolute: 0.7 10*3/uL (ref 0.1–0.9)
Monocytes: 9 %
NEUTROS PCT: 53 %
Neutrophils Absolute: 4 10*3/uL (ref 1.4–7.0)
PLATELETS: 280 10*3/uL (ref 150–379)
RBC: 4.88 x10E6/uL (ref 4.14–5.80)
RDW: 14.8 % (ref 12.3–15.4)
WBC: 7.7 10*3/uL (ref 3.4–10.8)

## 2016-08-13 LAB — LIPID PANEL
CHOLESTEROL TOTAL: 199 mg/dL (ref 100–199)
Chol/HDL Ratio: 5.4 ratio — ABNORMAL HIGH (ref 0.0–5.0)
HDL: 37 mg/dL — ABNORMAL LOW (ref >39)
LDL CALC: 132 mg/dL — AB (ref 0–99)
Triglycerides: 149 mg/dL (ref 0–149)
VLDL CHOLESTEROL CAL: 30 mg/dL (ref 5–40)

## 2016-08-13 LAB — PSA: PROSTATE SPECIFIC AG, SERUM: 12.2 ng/mL — AB (ref 0.0–4.0)

## 2016-08-13 NOTE — Telephone Encounter (Signed)
See labs 

## 2016-08-13 NOTE — Telephone Encounter (Signed)
Pt returned call about labs cb number [phone]

## 2016-09-12 ENCOUNTER — Encounter: Payer: Self-pay | Admitting: Internal Medicine

## 2016-09-12 ENCOUNTER — Ambulatory Visit (INDEPENDENT_AMBULATORY_CARE_PROVIDER_SITE_OTHER): Payer: 59 | Admitting: Internal Medicine

## 2016-09-12 VITALS — BP 142/84 | HR 66 | Temp 98.1°F | Wt 183.0 lb

## 2016-09-12 DIAGNOSIS — I10 Essential (primary) hypertension: Secondary | ICD-10-CM

## 2016-09-12 DIAGNOSIS — R972 Elevated prostate specific antigen [PSA]: Secondary | ICD-10-CM | POA: Diagnosis not present
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
## 2016-09-12 NOTE — Addendum Note (Signed)
Addended by: Ellamae Sia on: 09/12/2016 04:01 PM   Modules accepted: Orders

## 2016-09-12 NOTE — Assessment & Plan Note (Signed)
BP Readings from Last 3 Encounters:  09/12/16 (!) 142/84  08/12/16 (!) 144/76  12/25/15 140/82   BP not really down but having a lot of stress at work Will just plan to recheck again in 3 months

## 2016-09-12 NOTE — Assessment & Plan Note (Signed)
Discussed plans If PSA still over 10, will refer for biopsy [facility])

## 2016-09-13 LAB — RENAL FUNCTION PANEL
Albumin: 4.2 g/dL (ref 3.6–4.8)
BUN / CREAT RATIO: 15 (ref 10–24)
BUN: 17 mg/dL (ref 8–27)
CALCIUM: 9.4 mg/dL (ref 8.6–10.2)
CHLORIDE: 104 mmol/L (ref 96–106)
CO2: 22 mmol/L (ref 20–29)
Creatinine, Ser: 1.14 mg/dL (ref 0.76–1.27)
GFR calc Af Amer: 80 mL/min/{1.73_m2} (ref >59)
GFR calc non Af Amer: 69 mL/min/{1.73_m2} (ref >59)
Glucose: 88 mg/dL (ref 65–99)
PHOSPHORUS: 3.6 mg/dL (ref 2.5–4.5)
Potassium: 4.7 mmol/L (ref 3.5–5.2)
SODIUM: 142 mmol/L (ref 134–144)

## 2016-09-13 LAB — PSA, TOTAL AND FREE
PROSTATE SPECIFIC AG, SERUM: 11.5 ng/mL — AB (ref 0.0–4.0)
PSA, Free Pct: 14.8 %
PSA, Free: 1.7 ng/mL

## 2016-09-14 ENCOUNTER — Other Ambulatory Visit: Payer: Self-pay | Admitting: Internal Medicine

## 2016-09-14 DIAGNOSIS — R972 Elevated prostate specific antigen [PSA]: Secondary | ICD-10-CM

## 2016-10-15 ENCOUNTER — Ambulatory Visit: Payer: 59 | Admitting: Urology

## 2016-10-15 ENCOUNTER — Encounter: Payer: Self-pay | Admitting: Urology

## 2016-10-15 VITALS — BP 156/77 | HR 84 | Ht 72.0 in | Wt 178.0 lb

## 2016-10-15 DIAGNOSIS — R972 Elevated prostate specific antigen [PSA]: Secondary | ICD-10-CM | POA: Diagnosis not present

## 2016-10-15 DIAGNOSIS — R3129 Other microscopic hematuria: Secondary | ICD-10-CM | POA: Diagnosis not present

## 2016-10-15 LAB — URINALYSIS, COMPLETE
Bilirubin, UA: NEGATIVE
GLUCOSE, UA: NEGATIVE
KETONES UA: NEGATIVE
LEUKOCYTES UA: NEGATIVE
NITRITE UA: NEGATIVE
PROTEIN UA: NEGATIVE
SPEC GRAV UA: 1.015 (ref 1.005–1.030)
Urobilinogen, Ur: 0.2 mg/dL (ref 0.2–1.0)
pH, UA: 5.5 (ref 5.0–7.5)

## 2016-10-15 LAB — MICROSCOPIC EXAMINATION
Epithelial Cells (non renal): NONE SEEN /hpf (ref 0–10)
WBC, UA: NONE SEEN /hpf (ref 0–?)

## 2016-10-15 NOTE — Progress Notes (Signed)
10/15/2016 9:13 AM   Justin Terrell 09-Mar-1955 [id number]  Referring provider: Venia Carbon, MD [address] [place], [place] [address]  Chief Complaint  Patient presents with  . Elevated PSA    New Patient    HPI:  Consultation for elevated PSA.   1) PSa - Patient with a rapidly rising PSA from 1.9 in 2014 up to 11.5 and 2018.  PSA history: 2014 PSA 1.9 2016 PSA 3.03 August 2016 PSA 12.2 - pt reports he took abx for an "infection".  July 2018 PSA 11.5 No FH PCa.  No h/o BPH. He has occasional frequency and intermittent flow. No weak stream or urgency.  Denies ED.             Patient has a history of coronary artery disease and MI. He takes aspirin.   2) MH - UA with rbc's and bacteria today. 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 He has occasional RLQ pain and passed a kidney stone years ago.  Apr 2017 CT reported as normal.    PMH: Past Medical History:  Diagnosis Date  . AVM (arteriovenous malformation) of ascending colon 12/25/2012  . Coronary atherosclerosis of native coronary artery 05/26/2015  . GERD (gastroesophageal reflux disease)    aspirin sensitive, prn meds only  . Hyperlipidemia   . Hypertension   . Lumbar disc disease   . Nephrolithiasis   . Personal history of colonic adenoma 12/30/2012  . Ureteral duplication, right     Surgical History: Past Surgical History:  Procedure Laterality Date  . ANGIOPLASTY     thigh  . BACK SURGERY    . CARDIAC CATHETERIZATION N/A 07/05/2014   Procedure: Left Heart Cath and Coronary Angiography;  Surgeon: Isaias Cowman, MD;  Location: [facility];  Service: Cardiovascular;  Laterality: N/A;  . DOPPLER ECHOCARDIOGRAPHY  06/24/07   stress echo normal. Nl baseline EF  . HAND NERVE REPAIR  1970's   both hands  . KNEE ARTHROSCOPY  1990's   right   . [place] SURGERY  1993  . Right leg compound fracture repair  1976    Home Medications:  Allergies as of 10/15/2016      Reactions     Codeine Nausea And Vomiting   Pravastatin Other (See Comments)   Reaction:  Chest pain       Medication List       Accurate as of 10/15/16  9:13 AM. Always use your most recent med list.          aspirin EC 81 MG tablet Take 81 mg by mouth daily.   atorvastatin 40 MG tablet Commonly known as:  LIPITOR Take 1 tablet (40 mg total) by mouth daily.   lisinopril 20 MG tablet Commonly known as:  PRINIVIL,ZESTRIL Take 1 tablet (20 mg total) by mouth daily.   metoprolol succinate 25 MG 24 hr tablet Commonly known as:  TOPROL-XL Take 1 tablet (25 mg total) by mouth daily.       Allergies:  Allergies  Allergen Reactions  . Codeine Nausea And Vomiting  . Pravastatin Other (See Comments)    Reaction:  Chest pain     Family History: Family History  Problem Relation Age of Onset  . Heart disease Father   . Hypertension Father   . Coronary artery disease Father   . Valvular heart disease Father   . Cancer Mother        colon cancer  . Colon  cancer Mother 66  . Throat cancer Mother   . Diabetes Neg Hx   . Prostate cancer Neg Hx   . Kidney cancer Neg Hx     Social History:  reports that he quit smoking about 7 years ago. His smoking use included Cigarettes. He smoked 0.10 packs per day. He has never used smokeless tobacco. He reports that he does not drink alcohol or use drugs.  ROS: UROLOGY Frequent Urination?: Yes Hard to postpone urination?: No Burning/pain with urination?: No Get up at night to urinate?: No Leakage of urine?: No Urine stream starts and stops?: Yes Trouble starting stream?: No Do you have to strain to urinate?: No Blood in urine?: No Urinary tract infection?: No Sexually transmitted disease?: No Injury to kidneys or bladder?: No Painful intercourse?: No Weak stream?: No Erection problems?: No Penile pain?: No  Gastrointestinal Nausea?: No Vomiting?: No Indigestion/heartburn?: No Diarrhea?: No Constipation?:  No  Constitutional Fever: No Night sweats?: No Weight loss?: No Fatigue?: Yes  Skin Skin rash/lesions?: No Itching?: No  Eyes Blurred vision?: No Double vision?: No  Ears/Nose/Throat Sore throat?: No Sinus problems?: No  Hematologic/Lymphatic Swollen glands?: No Easy bruising?: No  Cardiovascular Leg swelling?: No Chest pain?: No  Respiratory Cough?: No Shortness of breath?: No  Endocrine Excessive thirst?: No  Musculoskeletal Back pain?: No Joint pain?: No  Neurological Headaches?: No Dizziness?: No  Psychologic Depression?: No Anxiety?: No  Physical Exam: BP (!) 156/77   Pulse 84   Ht 6' (1.829 m)   Wt 80.7 kg (178 lb)   BMI 24.14 kg/m   Constitutional:  Alert and oriented, No acute distress. HEENT: [place] AT, moist mucus membranes.  Trachea midline, no masses. Cardiovascular: No clubbing, cyanosis, or edema. Respiratory: Normal respiratory effort, no increased work of breathing. GI: Abdomen is soft, nontender, nondistended, no abdominal masses GU: No CVA tenderness.  DRE: prostate ~ 35 grams, smooth, no hard area or nodule.  Skin: No rashes, bruises or suspicious lesions. Lymph: No cervical or inguinal adenopathy. Neurologic: Grossly intact, no focal deficits, moving all 4 extremities. Psychiatric: Normal mood and affect.  Laboratory Data: Lab Results  Component Value Date   WBC 7.7 08/12/2016   HGB 15.3 08/12/2016   HCT 43.5 08/12/2016   MCV 89 08/12/2016   PLT 280 08/12/2016    Lab Results  Component Value Date   CREATININE 1.14 09/12/2016    Lab Results  Component Value Date   PSA 3.9 05/10/2014   PSA 1.9 04/23/2012    No results found for: TESTOSTERONE  Lab Results  Component Value Date   HGBA1C 6.2 (H) 10/16/2012    Urinalysis    Component Value Date/Time   COLORURINE Straw 09/11/2012 0314   APPEARANCEUR Clear 09/11/2012 0314   LABSPEC 1.006 09/11/2012 0314   PHURINE 6.0 09/11/2012 0314   GLUCOSEU Negative  09/11/2012 0314   HGBUR 1+ 09/11/2012 0314   BILIRUBINUR Negative 09/11/2012 0314   KETONESUR Negative 09/11/2012 0314   PROTEINUR Negative 09/11/2012 0314   NITRITE Negative 09/11/2012 0314   LEUKOCYTESUR Negative 09/11/2012 0314     Assessment & Plan:   1) MH -  UA with some bacteria and rbc's. Also, rlq pain. Urine sent for cx. Discussed renal US vs CT and we'll set up for another CT. F/u for cystoscopy.   2) PSA - I had a long discussion with the patient and his wife on the nature of elevated PSA - benign vs malignant causes. We discussed age specific levels and  that PCa can be seen on a biopsy with very low PSA levels (<=2.5). We discussed the nature risks and benefits of continued surveillance, other lab tests, imaging as well as prostate biopsy. We discussed the management of prostate cancer might include active surveillance or treatment depending on biopsy findings. I believe hjs PSA density is high, but his DRE is normal. I recommended a prostate biopsy, but we need to sort out the [place] first. All questions answered. They asked about the # cores, CT, MRI, PET scans, management of cancer if positive and # and grade of cancer. He'll consider after above eval and PSA was resent today.     There are no diagnoses linked to this encounter.  No Follow-up on file.  Festus Aloe, [facility] [address], [place], [place] [address] [phone]

## 2016-10-16 LAB — PSA TOTAL (REFLEX TO FREE): PROSTATE SPECIFIC AG, SERUM: 12.3 ng/mL — AB (ref 0.0–4.0)

## 2016-10-18 ENCOUNTER — Telehealth: Payer: Self-pay

## 2016-10-18 LAB — CULTURE, URINE COMPREHENSIVE

## 2016-10-18 NOTE — Telephone Encounter (Signed)
Festus Aloe, MD  Lestine Box, LPN        Notify patient his PSA remains high -- give result - keep plan for CT and cysto to eval MH and then we'll consider prostate biopsy    [facility]

## 2016-10-18 NOTE — Telephone Encounter (Signed)
Spoke with pt in reference to lab results and keeping CT and cysto appts. Pt voiced understanding.

## 2016-10-22 ENCOUNTER — Ambulatory Visit
Admission: RE | Admit: 2016-10-22 | Discharge: 2016-10-22 | Disposition: A | Discharge status: Home / Self Care | Payer: 59 | Source: Ambulatory Visit | Attending: Urology | Admitting: Urology

## 2016-10-22 DIAGNOSIS — R3129 Other microscopic hematuria: Secondary | ICD-10-CM

## 2016-10-22 DIAGNOSIS — I700 Atherosclerosis of aorta: Secondary | ICD-10-CM | POA: Diagnosis not present

## 2016-10-22 DIAGNOSIS — R972 Elevated prostate specific antigen [PSA]: Secondary | ICD-10-CM

## 2016-10-22 DIAGNOSIS — I2510 Atherosclerotic heart disease of native coronary artery without angina pectoris: Secondary | ICD-10-CM | POA: Insufficient documentation

## 2016-10-22 MED ORDER — IOPAMIDOL (ISOVUE-300) INJECTION 61%
125.0000 mL | Freq: Once | INTRAVENOUS | Status: AC | PRN
  Administered 2016-10-22: 125 mL via INTRAVENOUS

## 2016-10-29 ENCOUNTER — Telehealth: Payer: Self-pay

## 2016-10-29 NOTE — Telephone Encounter (Signed)
Pt called and I read message from [place].

## 2016-10-29 NOTE — Telephone Encounter (Signed)
Festus Aloe, MD  Lestine Box, LPN        Notify patient his CT scan showed nothing worrisome and no sign of prostate cancer, but his prostate is not all that large, so an enlarged prostate does not totally explain his elevated PSA. F/u as planned and we'll consider prostate biopsy.    [facility]

## 2016-11-05 ENCOUNTER — Ambulatory Visit: Payer: 59 | Admitting: Urology

## 2016-11-05 ENCOUNTER — Encounter: Payer: Self-pay | Admitting: Urology

## 2016-11-05 VITALS — BP 147/64 | HR 80 | Ht 72.0 in | Wt 184.4 lb

## 2016-11-05 DIAGNOSIS — R3129 Other microscopic hematuria: Secondary | ICD-10-CM | POA: Diagnosis not present

## 2016-11-05 DIAGNOSIS — R972 Elevated prostate specific antigen [PSA]: Secondary | ICD-10-CM

## 2016-11-05 LAB — URINALYSIS, COMPLETE
BILIRUBIN UA: NEGATIVE
GLUCOSE, UA: NEGATIVE
KETONES UA: NEGATIVE
Leukocytes, UA: NEGATIVE
Nitrite, UA: NEGATIVE
PROTEIN UA: NEGATIVE
Specific Gravity, UA: 1.025 (ref 1.005–1.030)
Urobilinogen, Ur: 0.2 mg/dL (ref 0.2–1.0)
pH, UA: 6 (ref 5.0–7.5)

## 2016-11-05 MED ORDER — CIPROFLOXACIN HCL 500 MG PO TABS
500.0000 mg | ORAL_TABLET | Freq: Once | ORAL | Status: AC
  Administered 2016-11-05: 500 mg via ORAL

## 2016-11-05 MED ORDER — LIDOCAINE HCL 2 % EX GEL
1.0000 "application " | Freq: Once | CUTANEOUS | Status: AC
  Administered 2016-11-05: 1 via URETHRAL

## 2016-11-05 NOTE — Progress Notes (Signed)
11/05/2016 2:37 PM   Justin Terrell Feb 28, 1955 [id number]  Referring provider: Venia Carbon, MD [place], [place] [address]  No chief complaint on file.   HPI:    1) PSa - Patient with a rapidly rising PSA from 1.9 in 2014 up to 11.5 and 2018.  PSA history: 2014 PSA 1.9 2016 PSA 3.03 August 2016 PSA 12.2 - pt reports he took abx for an "infection".  July 2018 PSA 11.29 September 2016 PSA 12.3, normal DRE, prostate ~50g on CT, neg staging  No FH PCa.  No h/o BPH. He has occasional frequency and intermittent flow. No weak stream or urgency.  Denies ED.             Patient has a history of coronary artery disease and MI. He takes aspirin.   2) MH - UA with rbc's and bacteria today. 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 He has occasional RLQ pain and passed a kidney stone years ago.  Apr 2017 CT reported as normal. His urine culture was negative.  Patient returns in continued management of elevated PSA and microscopic hematuria. He underwent CT scan of the abdomen and pelvis which was benign. Prostate measured 50 g. I reviewed the images. PSA remains elevated at 12.3.  PMH: Past Medical History:  Diagnosis Date  . AVM (arteriovenous malformation) of ascending colon 12/25/2012  . Coronary atherosclerosis of native coronary artery 05/26/2015  . GERD (gastroesophageal reflux disease)    aspirin sensitive, prn meds only  . Hyperlipidemia   . Hypertension   . Lumbar disc disease   . Nephrolithiasis   . Personal history of colonic adenoma 12/30/2012  . Ureteral duplication, right     Surgical History: Past Surgical History:  Procedure Laterality Date  . ANGIOPLASTY     thigh  . BACK SURGERY    . CARDIAC CATHETERIZATION N/A 07/05/2014   Procedure: Left Heart Cath and Coronary Angiography;  Surgeon: Isaias Cowman, MD;  Location: [facility];  Service: Cardiovascular;  Laterality: N/A;  . DOPPLER ECHOCARDIOGRAPHY  06/24/07   stress echo  normal. Nl baseline EF  . HAND NERVE REPAIR  1970's   both hands  . KNEE ARTHROSCOPY  1990's   right   . [place] SURGERY  1993  . Right leg compound fracture repair  1976    Home Medications:  Allergies as of 11/05/2016      Reactions   Codeine Nausea And Vomiting   Pravastatin Other (See Comments)   Reaction:  Chest pain       Medication List       Accurate as of 11/05/16  2:37 PM. Always use your most recent med list.          aspirin EC 81 MG tablet Take 81 mg by mouth daily.   atorvastatin 40 MG tablet Commonly known as:  LIPITOR Take 1 tablet (40 mg total) by mouth daily.   lisinopril 20 MG tablet Commonly known as:  PRINIVIL,ZESTRIL Take 1 tablet (20 mg total) by mouth daily.   metoprolol succinate 25 MG 24 hr tablet Commonly known as:  TOPROL-XL Take 1 tablet (25 mg total) by mouth daily.       Allergies:  Allergies  Allergen Reactions  . Codeine Nausea And Vomiting  . Pravastatin Other (See Comments)    Reaction:  Chest pain     Family History: Family History  Problem Relation Age of Onset  . Heart disease  Father   . Hypertension Father   . Coronary artery disease Father   . Valvular heart disease Father   . Cancer Mother        colon cancer  . Colon cancer Mother 31  . Throat cancer Mother   . Diabetes Neg Hx   . Prostate cancer Neg Hx   . Kidney cancer Neg Hx     Social History:  reports that he quit smoking about 7 years ago. His smoking use included Cigarettes. He smoked 0.10 packs per day. He has never used smokeless tobacco. He reports that he does not drink alcohol or use drugs.  ROS:                                        Physical Exam: There were no vitals taken for this visit.  Constitutional:  Alert and oriented, No acute distress. HEENT: [place] AT, moist mucus membranes.  Trachea midline, no masses. Cardiovascular: No clubbing, cyanosis, or edema. Respiratory: Normal respiratory effort, no increased  work of breathing. GI: Abdomen is soft, nontender, nondistended, no abdominal masses GU: No CVA tenderness. Penis, meatus normal. Scrotum normal.  Skin: No rashes, bruises or suspicious lesions. Lymph: No cervical or inguinal adenopathy. Neurologic: Grossly intact, no focal deficits, moving all 4 extremities. Psychiatric: Normal mood and affect.  Laboratory Data: Lab Results  Component Value Date   WBC 7.7 08/12/2016   HGB 15.3 08/12/2016   HCT 43.5 08/12/2016   MCV 89 08/12/2016   PLT 280 08/12/2016    Lab Results  Component Value Date   CREATININE 1.14 09/12/2016    Lab Results  Component Value Date   PSA 3.9 05/10/2014   PSA 1.9 04/23/2012    No results found for: TESTOSTERONE  Lab Results  Component Value Date   HGBA1C 6.2 (H) 10/16/2012    Urinalysis    Component Value Date/Time   COLORURINE Straw 09/11/2012 0314   APPEARANCEUR Clear 10/15/2016 0941   LABSPEC 1.006 09/11/2012 0314   PHURINE 6.0 09/11/2012 0314   GLUCOSEU Negative 10/15/2016 0941   GLUCOSEU Negative 09/11/2012 0314   HGBUR 1+ 09/11/2012 0314   BILIRUBINUR Negative 10/15/2016 0941   BILIRUBINUR Negative 09/11/2012 0314   KETONESUR Negative 09/11/2012 0314   PROTEINUR Negative 10/15/2016 0941   PROTEINUR Negative 09/11/2012 0314   NITRITE Negative 10/15/2016 0941   NITRITE Negative 09/11/2012 0314   LEUKOCYTESUR Negative 10/15/2016 0941   LEUKOCYTESUR Negative 09/11/2012 0314     Cystoscopy Procedure Note  Patient identification was confirmed, informed consent was obtained, and patient was prepped using Betadine solution.  Lidocaine jelly was administered per urethral meatus.    Preoperative abx where received prior to procedure.     Pre-Procedure: - Inspection reveals a normal caliber ureteral meatus.  Procedure: The flexible cystoscope was introduced without difficulty - No urethral strictures/lesions are present. - prostate mild lateral lobe hypertrophy  - bladder neck  slightly elevated  - Bilateral ureteral orifices identified - Bladder mucosa  reveals no ulcers, tumors, or lesions - No bladder stones - No trabeculation  Retroflexion shows normal bladder neck    Post-Procedure: - Patient tolerated the procedure well  Assessment/ Plan:   1. Microhematuria Benign evaluation.  - Urinalysis, Complete - ciprofloxacin (CIPRO) tablet 500 mg; Take 1 tablet (500 mg total) by mouth once. - lidocaine (XYLOCAINE) 2 % jelly 1 application; Place 1 application into the urethra  once.  2. PSA - remains elevated and PSAD is high. We discussed again the nature r/b/a to prostate biopsy and he elects to proceed.   No Follow-up on file.  Festus Aloe, [facility] [address], [place], [place] [address] [phone]

## 2016-11-06 ENCOUNTER — Telehealth: Payer: Self-pay | Admitting: Family Medicine

## 2016-11-06 NOTE — Telephone Encounter (Signed)
Patient notified that he was cleared by Cardiologist to stop Aspirin 7 days prior to procedure.

## 2016-11-18 ENCOUNTER — Encounter: Payer: Self-pay | Admitting: Urology

## 2016-11-18 ENCOUNTER — Ambulatory Visit (INDEPENDENT_AMBULATORY_CARE_PROVIDER_SITE_OTHER): Payer: 59 | Admitting: Urology

## 2016-11-18 ENCOUNTER — Other Ambulatory Visit: Payer: Self-pay | Admitting: Urology

## 2016-11-18 VITALS — BP 127/73 | HR 72 | Ht 72.0 in | Wt 178.0 lb

## 2016-11-18 DIAGNOSIS — R972 Elevated prostate specific antigen [PSA]: Secondary | ICD-10-CM

## 2016-11-18 MED ORDER — GENTAMICIN SULFATE 40 MG/ML IJ SOLN
80.0000 mg | Freq: Once | INTRAMUSCULAR | Status: AC
  Administered 2016-11-18: 80 mg via INTRAMUSCULAR

## 2016-11-18 MED ORDER — LEVOFLOXACIN 500 MG PO TABS
500.0000 mg | ORAL_TABLET | Freq: Once | ORAL | Status: AC
  Administered 2016-11-18: 500 mg via ORAL

## 2016-11-18 NOTE — Progress Notes (Signed)
HPI: 1) elevated PSA  - Patient with a rapidly rising PSA from 1.9 in 2014 up to 11.5 and 2018.  PSA history: 2014 PSA 1.9 2016 PSA 3.03 August 2016 PSA 12.2 - pt reports he took abx for an "infection".  July 2018 PSA 11.29 September 2016 PSA 12.3, normal DRE, prostate ~50g on CT, neg staging  No FH PCa.  No h/o BPH. He has occasional frequency and intermittent flow. No weak stream or urgency.  Denies ED.  Patient has a history of coronary artery disease and MI. He takes aspirin.   2) MH - UA with rbc's and bacteria. 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 He has occasional RLQ pain and passed a kidney stone years ago. Apr 2017 CT reported as normal. His urine culture was negative. CT scan of the abdomen and pelvis was benign 10/23/2016. Prostate measured 50 g. Cystoscopy was normal 11/05/2016.   Pt returns today for prostate biopsy.   Prostate Biopsy Procedure   Informed consent was obtained after discussing risks/benefits of the procedure.  A time out was performed to ensure correct patient identity.  Pre-Procedure: - Last PSA Level:  Lab Results  Component Value Date   PSA 3.9 05/10/2014   PSA 1.9 04/23/2012   - Gentamicin given prophylactically - Levaquin 500 mg administered PO -DRE: subtle  L>R asymmetry, landmarks preserved, no hard area  -Transrectal Ultrasound performed revealing a 30.41 gram prostate -No significant hypoechoic or median lobe noted  Procedure: - Prostate block performed using 10 cc 1% lidocaine and biopsies taken from sextant areas, a total of 12 under ultrasound guidance.  Post-Procedure: - Patient tolerated the procedure well - He was counseled to seek immediate medical attention if experiences any severe pain, significant bleeding, or fevers - Return in one week to discuss biopsy results

## 2016-11-21 ENCOUNTER — Other Ambulatory Visit: Payer: Self-pay | Admitting: Urology

## 2016-11-21 LAB — PATHOLOGY REPORT

## 2016-12-04 ENCOUNTER — Ambulatory Visit (INDEPENDENT_AMBULATORY_CARE_PROVIDER_SITE_OTHER): Payer: 59 | Admitting: Urology

## 2016-12-04 ENCOUNTER — Telehealth: Payer: Self-pay

## 2016-12-04 VITALS — BP 144/92 | HR 63 | Ht 72.0 in | Wt 183.5 lb

## 2016-12-04 DIAGNOSIS — C61 Malignant neoplasm of prostate: Secondary | ICD-10-CM

## 2016-12-04 MED ORDER — DIAZEPAM 10 MG PO TABS: 10.0000 mg | ORAL_TABLET | Freq: Once | ORAL | 0 refills | Status: AC

## 2016-12-04 NOTE — Telephone Encounter (Signed)
Per Dr. Junious Silk spoke with Rosann Auerbach at [facility] in regards to a second opinion for patient's prostate biopsy pathology. Rosann Auerbach was given our contact information and the patient's and states she will await on slides from [place] and fax [place] their report.  A request for slides to be sent from [place] to [facility] was faxed on 12-04-16 to fax [phone] Pathology Services

## 2016-12-04 NOTE — Progress Notes (Signed)
12/04/2016 4:22 PM   Justin Terrell 1955/04/21 [id number]  Referring provider: Venia Carbon, MD [address] [place], [place] [address]  Chief Complaint  Patient presents with  . Results    HPI: Patient returns with new diagnosis of prostate cancer to discuss biopsy results. He's been well.   1) Favorable Intermediate Risk PCa - Oct 2018 PSA 12.3 T1c Prostate 30 grams Gleason 3+3=6, left apex x 1, 2% Gleason 3+4=7, right mid x 1, 3 % (sample too small to calculate Gleason 4 component)  No h/o BPH. He has occasional frequency and intermittent flow. No weak stream or urgency.  Denies ED.  MSK: ECE 52%, SV 3%, LN 3%; PFS surgery 81%, 69%.   2) MH - UA with rbc's and bacteria. 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 He has occasional RLQ pain and passed a kidney stone years ago. Apr 2017 CT reported as normal. His urine culture was negative. CT scan of the abdomen and pelvis was benign 10/23/2016. Prostate measured 50 g. Cystoscopy was normal 11/05/2016.   PMH: Past Medical History:  Diagnosis Date  . AVM (arteriovenous malformation) of ascending colon 12/25/2012  . Coronary atherosclerosis of native coronary artery 05/26/2015  . GERD (gastroesophageal reflux disease)    aspirin sensitive, prn meds only  . Hyperlipidemia   . Hypertension   . Lumbar disc disease   . Nephrolithiasis   . Personal history of colonic adenoma 12/30/2012  . Ureteral duplication, right     Surgical History: Past Surgical History:  Procedure Laterality Date  . ANGIOPLASTY     thigh  . BACK SURGERY    . CARDIAC CATHETERIZATION N/A 07/05/2014   Procedure: Left Heart Cath and Coronary Angiography;  Surgeon: Isaias Cowman, MD;  Location: [facility];  Service: Cardiovascular;  Laterality: N/A;  . DOPPLER ECHOCARDIOGRAPHY  06/24/07   stress echo normal. Nl baseline EF  . HAND NERVE REPAIR  1970's   both hands  . KNEE ARTHROSCOPY  1990's   right   . [place] SURGERY  1993  . Right leg compound fracture repair  1976    Home Medications:  Allergies as of 12/04/2016      Reactions   Codeine Nausea And Vomiting   Pravastatin Other (See Comments)   Reaction:  Chest pain       Medication List       Accurate as of 12/04/16  4:22 PM. Always use your most recent med list.          aspirin EC 81 MG tablet Take 81 mg by mouth daily.   atorvastatin 40 MG tablet Commonly known as:  LIPITOR Take 1 tablet (40 mg total) by mouth daily.   lisinopril 10 MG tablet Commonly known as:  PRINIVIL,ZESTRIL Take 2 tablets by mouth daily.   metoprolol succinate 25 MG 24 hr tablet Commonly known as:  TOPROL-XL Take 1 tablet (25 mg total) by mouth daily.       Allergies:  Allergies  Allergen Reactions  . Codeine Nausea And Vomiting  . Pravastatin Other (See Comments)    Reaction:  Chest pain     Family History: Family History  Problem Relation Age of Onset  . Heart disease Father   . Hypertension Father   . Coronary artery disease Father   . Valvular heart disease Father   . Cancer Mother        colon cancer  . Colon cancer Mother 76  .  Throat cancer Mother   . Diabetes Neg Hx   . Prostate cancer Neg Hx   . Kidney cancer Neg Hx     Social History:  reports that he quit smoking about 7 years ago. His smoking use included Cigarettes. He smoked 0.10 packs per day. He has never used smokeless tobacco. He reports that he does not drink alcohol or use drugs.  ROS: UROLOGY Frequent Urination?: Yes Hard to postpone urination?: No Burning/pain with urination?: No Get up at night to urinate?: Yes Leakage of urine?: No Urine stream starts and stops?: No Trouble starting stream?: No Do you have to strain to urinate?: No Blood in urine?: No Urinary tract infection?: No Sexually transmitted disease?: No Injury to kidneys or bladder?: No Painful intercourse?: No Weak stream?: No Erection problems?: No Penile pain?:  No  Gastrointestinal Nausea?: No Vomiting?: No Indigestion/heartburn?: No Diarrhea?: No Constipation?: No  Constitutional Fever: No Night sweats?: No Weight loss?: No Fatigue?: No  Skin Skin rash/lesions?: No Itching?: No  Eyes Blurred vision?: No Double vision?: No  Ears/Nose/Throat Sore throat?: No Sinus problems?: No  Hematologic/Lymphatic Swollen glands?: No Easy bruising?: No  Cardiovascular Leg swelling?: No Chest pain?: No  Respiratory Cough?: No Shortness of breath?: No  Endocrine Excessive thirst?: No  Musculoskeletal Back pain?: No Joint pain?: No  Neurological Headaches?: No Dizziness?: No  Psychologic Depression?: No Anxiety?: No  Physical Exam: BP (!) 144/92 (BP Location: Right Arm, Patient Position: Sitting, Cuff Size: Normal)   Pulse 63   Ht 6' (1.829 m)   Wt 83.2 kg (183 lb 8 oz)   BMI 24.89 kg/m   Constitutional:  Alert and oriented, No acute distress. HEENT: [place] AT, moist mucus membranes.  Trachea midline, no masses. Cardiovascular: No clubbing, cyanosis, or edema. Respiratory: Normal respiratory effort, no increased work of breathing. GI: Abdomen is soft, nontender, nondistended, no abdominal masses GU: No CVA tenderness.  Skin: No rashes, bruises or suspicious lesions. Lymph: No cervical or inguinal adenopathy. Neurologic: Grossly intact, no focal deficits, moving all 4 extremities. Psychiatric: Normal mood and affect.  Laboratory Data: Lab Results  Component Value Date   WBC 7.7 08/12/2016   HGB 15.3 08/12/2016   HCT 43.5 08/12/2016   MCV 89 08/12/2016   PLT 280 08/12/2016    Lab Results  Component Value Date   CREATININE 1.14 09/12/2016    Lab Results  Component Value Date   PSA1 12.3 (H) 10/15/2016   PSA1 11.5 (H) 09/12/2016   PSA1 12.2 (H) 08/12/2016    No results found for: TESTOSTERONE  Lab Results  Component Value Date   HGBA1C 6.2 (H) 10/16/2012    Urinalysis Lab Results  Component Value  Date   SPECGRAV 1.025 11/05/2016   PHUR 6.0 11/05/2016   COLORU Yellow 11/05/2016   APPEARANCEUR Clear 11/05/2016   LEUKOCYTESUR Negative 11/05/2016   PROTEINUR Negative 11/05/2016   GLUCOSEU Negative 11/05/2016   KETONESU Negative 11/05/2016   RBCU 1+ (A) 11/05/2016   BILIRUBINUR Negative 11/05/2016   UUROB 0.2 11/05/2016   NITRITE Negative 11/05/2016    Lab Results  Component Value Date   LABMICR See below: 10/15/2016   WBCUA None seen 10/15/2016   RBCUA 3-10 (A) 10/15/2016   LABEPIT None seen 10/15/2016   MUCUS Present (A) 10/15/2016   BACTERIA Few (A) 10/15/2016    Pertinent Imaging:  No results found for this or any previous visit. No results found for this or any previous visit. No results found for this or any  previous visit. No results found for this or any previous visit. Results for orders placed in visit on 11/15/11  US Renal   No results found for this or any previous visit. No results found for this or any previous visit. No results found for this or any previous visit.  Assessment & Plan:   Prostate cancer -- I had a long discussion with the patient and his wife using the Prostate Cancer book and his path report. We went over his stage, grade and prognosis and the relevant anatomy. We discussed the nature risks and benefits of active surveillance, radical prostatectomy, IMRT (+/- brachytherapy, +/- ADT). We discussed specifically how each treatment might affect the bowel, bladder and sexual function. We discussed how each treatment might effect salvage treatments. We discussed the role of other modalities in the treatment of prostate cancer including chemotherapy, HIFU and cryotherapy. His PSA doesn't quite correlate with the small amount of cancer seen on the biopsy. I want to get a second opinion on the path from [place] in [place] and we'll get an MRI in 6 weeks to plan management options (pt considering surgery or brachytherapy). He does have occasional frequent  now. He'll need a SHIM and AUASS when he returns in 6 weeks after MRI.    There are no diagnoses linked to this encounter.  No Follow-up on file.  Festus Aloe, [facility] [address] [place], [place] [address] [phone]

## 2016-12-09 ENCOUNTER — Other Ambulatory Visit: Payer: Self-pay | Admitting: Urology

## 2016-12-12 ENCOUNTER — Other Ambulatory Visit: Payer: Self-pay | Admitting: Urology

## 2016-12-19 ENCOUNTER — Ambulatory Visit: Payer: 59 | Admitting: Internal Medicine

## 2016-12-24 ENCOUNTER — Ambulatory Visit: Payer: 59

## 2016-12-26 ENCOUNTER — Telehealth: Payer: Self-pay | Admitting: Urology

## 2016-12-26 NOTE — Telephone Encounter (Signed)
No problem, I'll be happy to do a peer to peer and speak with one of their Dr's.

## 2016-12-26 NOTE — Telephone Encounter (Signed)
I still can't get Reichen's MRI approved with his insurance company. He has a follow up appointment with you on 01-13-17. I am going to try and call tomorrow and see if they will let me fax notes and go from there. If not I'm not sure what else to do without you speaking with one of their doctor's.  Please advise  Sharyn Lull

## 2016-12-26 NOTE — Telephone Encounter (Signed)
Can you do it from there if I give you the information if not it will have to wait until you come back on the 19th and I will have to reschd his app.

## 2016-12-27 NOTE — Telephone Encounter (Signed)
Yes, I can call from [facility] office.

## 2017-01-08 ENCOUNTER — Telehealth: Payer: Self-pay | Admitting: Urology

## 2017-01-13 ENCOUNTER — Ambulatory Visit: Payer: 59

## 2017-01-15 ENCOUNTER — Ambulatory Visit (HOSPITAL_COMMUNITY)
Admission: RE | Admit: 2017-01-15 | Discharge: 2017-01-15 | Disposition: A | Discharge status: Home / Self Care | Payer: 59 | Source: Ambulatory Visit | Attending: Urology | Admitting: Urology

## 2017-01-15 DIAGNOSIS — C61 Malignant neoplasm of prostate: Secondary | ICD-10-CM | POA: Diagnosis not present

## 2017-01-15 MED ORDER — GADOBENATE DIMEGLUMINE 529 MG/ML IV SOLN
20.0000 mL | Freq: Once | INTRAVENOUS | Status: AC | PRN
  Administered 2017-01-15: 17 mL via INTRAVENOUS

## 2017-01-17 LAB — POCT I-STAT CREATININE: CREATININE: 0.9 mg/dL (ref 0.61–1.24)

## 2017-01-21 ENCOUNTER — Telehealth: Payer: Self-pay

## 2017-01-21 DIAGNOSIS — R972 Elevated prostate specific antigen [PSA]: Secondary | ICD-10-CM

## 2017-01-21 NOTE — Telephone Encounter (Signed)
Pt called stating he could see his prostate MRI results on mychart. Pt states that he is still really confused and concerned about what to do next. Pt inquired about what the course of tx is going to be at this point. Please advise.

## 2017-01-21 NOTE — Telephone Encounter (Signed)
See other message under MRI result.

## 2017-01-21 NOTE — Telephone Encounter (Signed)
Festus Aloe, MD  Lestine Box, LPN        Notify patient -- his prostate MRI was normal - no prostate cancer was found. He should f/u in about 1 month with a PSA prior to discuss all results and make a long term plan. When he returns we should get an AUASS and SHIM score to check his baseline status.    LMOM

## 2017-01-21 NOTE — Telephone Encounter (Signed)
Pt would like to speak to you, if you could give him a call.  [phone]

## 2017-01-21 NOTE — Telephone Encounter (Signed)
Spoke with pt in reference to MRI results and f/u. Pt voiced understanding. OV and lab appt made.

## 2017-01-22 NOTE — Telephone Encounter (Signed)
I sent patient a message regarding the MRI results.  He had called the office and then he also called the [place] office.  I called him back and it went to his voicemail.  I left him a message regarding second opinion on the biopsy which continued to show very small amounts of cancer and that the MRI was normal (no cancer was seen).  Recommended he return to the office in a few weeks with a PSA prior to discuss again and make a long-term plan.

## 2017-02-07 ENCOUNTER — Other Ambulatory Visit: Payer: 59

## 2017-02-07 DIAGNOSIS — R972 Elevated prostate specific antigen [PSA]: Secondary | ICD-10-CM

## 2017-02-08 LAB — PSA: Prostate Specific Ag, Serum: 14.3 ng/mL — ABNORMAL HIGH (ref 0.0–4.0)

## 2017-02-10 ENCOUNTER — Ambulatory Visit (INDEPENDENT_AMBULATORY_CARE_PROVIDER_SITE_OTHER): Payer: 59 | Admitting: Urology

## 2017-02-10 ENCOUNTER — Encounter: Payer: Self-pay | Admitting: Urology

## 2017-02-10 VITALS — BP 127/76 | HR 86 | Ht 72.0 in | Wt 182.0 lb

## 2017-02-10 DIAGNOSIS — C61 Malignant neoplasm of prostate: Secondary | ICD-10-CM

## 2017-02-10 NOTE — Progress Notes (Signed)
02/10/2017 2:25 PM   Justin Terrell 21-Feb-1956 [id number]  Referring provider: Venia Carbon, MD [place], [place] [address]  No chief complaint on file.   HPI:   1) Favorable Intermediate Risk PCa - Sep 2018 PSA 12.3 T1c Prostate 30 grams Gleason 3+3=6, left apex x 1, 2% Gleason 3+4=7, right mid x 1, 3 % (sample too small to calculate Gleason 4 component)  No h/o BPH. He has occasional frequency and intermittent flow. No weak stream or urgency.  Denies ED.  MSK: ECE 52%, SV 3%, LN 3%; PFS surgery 81%, 69%.   Second opinion on pathology: Gleason 3+3 = 6, left lateral apex, 5% Gleason 3+4 = 7, right lateral mid, 5% (pattern for 4%)  Staging: Prostate MRI January 15, 2017, revealed a 38 g prostate without sign of prostate cancer, negative staging.  However, there was still quite a bit of hemorrhage remaining.  2)MH - UA with rbc's and bacteria. 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 He has occasional RLQ pain and passed a kidney stone years ago. Apr 2017 CT reported as normal. His urine culture was negative. CT scan of the abdomen and pelvis was benign 10/23/2016. Prostate measured 50 g. Cystoscopy was normal 11/05/2016.  He returns today to review second opinion on the pathology, prostate MRI and repeat PSA.  His PSA has risen to 14, but there was still quite a bit of hemorrhage on the MRI.    PMH: Past Medical History:  Diagnosis Date  . AVM (arteriovenous malformation) of ascending colon 12/25/2012  . Coronary atherosclerosis of native coronary artery 05/26/2015  . GERD (gastroesophageal reflux disease)    aspirin sensitive, prn meds only  . Hyperlipidemia   . Hypertension   . Lumbar disc disease   . Nephrolithiasis   . Personal history of colonic adenoma 12/30/2012  . Ureteral duplication, right     Surgical History: Past Surgical History:  Procedure Laterality Date  . ANGIOPLASTY     thigh  . BACK SURGERY    . CARDIAC  CATHETERIZATION N/A 07/05/2014   Procedure: Left Heart Cath and Coronary Angiography;  Surgeon: Isaias Cowman, MD;  Location: [facility];  Service: Cardiovascular;  Laterality: N/A;  . DOPPLER ECHOCARDIOGRAPHY  06/24/07   stress echo normal. Nl baseline EF  . HAND NERVE REPAIR  1970's   both hands  . KNEE ARTHROSCOPY  1990's   right   . [place] SURGERY  1993  . Right leg compound fracture repair  1976    Home Medications:  Allergies as of 02/10/2017      Reactions   Codeine Nausea And Vomiting   Pravastatin Other (See Comments)   Reaction:  Chest pain       Medication List        Accurate as of 02/10/17  2:25 PM. Always use your most recent med list.          aspirin EC 81 MG tablet Take 81 mg by mouth daily.   atorvastatin 40 MG tablet Commonly known as:  LIPITOR Take 1 tablet (40 mg total) by mouth daily.   lisinopril 10 MG tablet Commonly known as:  PRINIVIL,ZESTRIL Take 2 tablets by mouth daily.   metoprolol succinate 25 MG 24 hr tablet Commonly known as:  TOPROL-XL Take 1 tablet (25 mg total) by mouth daily.       Allergies:  Allergies  Allergen Reactions  . Codeine Nausea And Vomiting  .  Pravastatin Other (See Comments)    Reaction:  Chest pain     Family History: Family History  Problem Relation Age of Onset  . Heart disease Father   . Hypertension Father   . Coronary artery disease Father   . Valvular heart disease Father   . Cancer Mother        colon cancer  . Colon cancer Mother 34  . Throat cancer Mother   . Diabetes Neg Hx   . Prostate cancer Neg Hx   . Kidney cancer Neg Hx     Social History:  reports that he quit smoking about 7 years ago. His smoking use included cigarettes. He smoked 0.10 packs per day. he has never used smokeless tobacco. He reports that he does not drink alcohol or use drugs.  ROS:                                        Physical Exam: There were no vitals taken for  this visit.  Constitutional:  Alert and oriented, No acute distress. HEENT: [place] AT, moist mucus membranes.  Trachea midline, no masses. Cardiovascular: No clubbing, cyanosis, or edema. Respiratory: Normal respiratory effort, no increased work of breathing. GI: Abdomen is soft, nontender, nondistended, no abdominal masses Skin: No rashes, bruises or suspicious lesions. Neurologic: Grossly intact, no focal deficits, moving all 4 extremities. Psychiatric: Normal mood and affect.  Laboratory Data: Lab Results  Component Value Date   WBC 7.7 08/12/2016   HGB 15.3 08/12/2016   HCT 43.5 08/12/2016   MCV 89 08/12/2016   PLT 280 08/12/2016    Lab Results  Component Value Date   CREATININE 0.90 01/15/2017    Lab Results  Component Value Date   PSA1 14.3 (H) 02/07/2017   PSA1 12.3 (H) 10/15/2016   PSA1 11.5 (H) 09/12/2016    No results found for: TESTOSTERONE  Lab Results  Component Value Date   HGBA1C 6.2 (H) 10/16/2012    Urinalysis Lab Results  Component Value Date   SPECGRAV 1.025 11/05/2016   PHUR 6.0 11/05/2016   COLORU Yellow 11/05/2016   APPEARANCEUR Clear 11/05/2016   LEUKOCYTESUR Negative 11/05/2016   PROTEINUR Negative 11/05/2016   GLUCOSEU Negative 11/05/2016   KETONESU Negative 11/05/2016   RBCU 1+ (A) 11/05/2016   BILIRUBINUR Negative 11/05/2016   UUROB 0.2 11/05/2016   NITRITE Negative 11/05/2016    Lab Results  Component Value Date   LABMICR See below: 10/15/2016   WBCUA None seen 10/15/2016   RBCUA 3-10 (A) 10/15/2016   LABEPIT None seen 10/15/2016   MUCUS Present (A) 10/15/2016   BACTERIA Few (A) 10/15/2016    Pertinent Imaging: MRI No results found for this or any previous visit. No results found for this or any previous visit. No results found for this or any previous visit. No results found for this or any previous visit. Results for orders placed in visit on 11/15/11  US Renal   No results found for this or any previous visit. No  results found for this or any previous visit. No results found for this or any previous visit.  Assessment & Plan:    Prostate cancer-early intermediate risk.  I discussed with the patient in the rising PSA and concern with a higher PSA density we may be missing more significant prostate cancer than what was seen on the biopsy or MRI.  We discussed the  nature risk and benefits of surgery or radiation.  We discussed androgen deprivation with radiation.  Another option would be to pursue genetic testing with [facility]. All questions answered. We will send genetics and he wants to f/u for a second opinion - maybe at [facility], Discussed importance of f/u for ongoing surveillance. Also, discussed cryo and HiFU - whole gland and focal. Discussed post-op incontinence and ED among others.   There are no diagnoses linked to this encounter.  No Follow-up on file.  Festus Aloe, MD  [facility] [address] [place], [place] [address] [phone]

## 2017-02-11 ENCOUNTER — Telehealth: Payer: Self-pay

## 2017-02-11 DIAGNOSIS — C61 Malignant neoplasm of prostate: Secondary | ICD-10-CM

## 2017-02-11 NOTE — Telephone Encounter (Signed)
Spoke with pt in reference to adding on promark genetic testing to pt prostate bx. Made wife aware promark has to be added within 60 days of bx and therefore test will not be added to this bx. Wife voiced understanding.   Wife inquired about a referral to [facility] and seeing a specific provider. Has a referral been placed and if not who is the provider? I will add that to the referral.

## 2017-02-12 NOTE — Telephone Encounter (Signed)
Festus Aloe, MD  Lestine Box, LPN        Not sure if my message went through-refer Mr Keo to [facility]. He can see Voncille Lo or Alford Highland. Thanks!    Referral placed.

## 2017-02-26 ENCOUNTER — Other Ambulatory Visit: Payer: Self-pay | Admitting: Internal Medicine

## 2017-03-26 ENCOUNTER — Telehealth: Payer: Self-pay | Admitting: Internal Medicine

## 2017-03-26 NOTE — Telephone Encounter (Signed)
Copied from [place] [id number]. Topic: Quick Communication - Rx Refill/Question >> Mar 26, 2017  1:03 PM Robina Ade, Helene Kelp D wrote: Medication: lisinopril (PRINIVIL,ZESTRIL) 10 MG tablet take 2 a day need more refill change quantity.   Has the patient contacted their pharmacy? Yes   (Agent: If no, request that the patient contact the pharmacy for the refill.)  [place], [place]: Please be advised that RX refills may take up to 3 business days. We ask that you follow-up with your pharmacy.

## 2017-03-26 NOTE — Telephone Encounter (Signed)
Need to correct dosing to 20 mg - or change # of tablets on current Rx

## 2017-03-27 MED ORDER — LISINOPRIL 20 MG PO TABS: 20.0000 mg | ORAL_TABLET | Freq: Every day | ORAL | 1 refills | Status: DC

## 2017-03-27 NOTE — Telephone Encounter (Signed)
New rx sent to pharmacy and 10mg  d/c

## 2017-03-27 NOTE — Telephone Encounter (Signed)
Probably should change to 20mg  once a day unless he prefers 10mg  bid

## 2017-05-26 HISTORY — PX: PROSTATE SURGERY: SHX751

## 2017-06-04 MED ORDER — SIMETHICONE 80 MG PO CHEW
80.00 | CHEWABLE_TABLET | ORAL | Status: DC
Start: ? — End: 2017-06-04

## 2017-06-04 MED ORDER — ONDANSETRON HCL 4 MG/2ML IJ SOLN
4.00 | INTRAMUSCULAR | Status: DC
Start: ? — End: 2017-06-04

## 2017-06-04 MED ORDER — HYDROMORPHONE HCL 1 MG/ML IJ SOLN
0.50 | INTRAMUSCULAR | Status: DC
Start: ? — End: 2017-06-04

## 2017-06-04 MED ORDER — BACITRACIN 500 UNIT/GM EX OINT
TOPICAL_OINTMENT | CUTANEOUS | Status: DC
Start: 2017-06-04 — End: 2017-06-04

## 2017-06-04 MED ORDER — ACETAMINOPHEN 325 MG PO TABS
975.00 | ORAL_TABLET | ORAL | Status: DC
Start: 2017-06-04 — End: 2017-06-04

## 2017-06-04 MED ORDER — SENNOSIDES-DOCUSATE SODIUM 8.6-50 MG PO TABS
2.00 | ORAL_TABLET | ORAL | Status: DC
Start: 2017-06-04 — End: 2017-06-04

## 2017-06-04 MED ORDER — ASPIRIN EC 81 MG PO TBEC
81.00 | DELAYED_RELEASE_TABLET | ORAL | Status: DC
Start: 2017-06-05 — End: 2017-06-04

## 2017-06-04 MED ORDER — CELECOXIB 100 MG PO CAPS
100.00 | ORAL_CAPSULE | ORAL | Status: DC
Start: 2017-06-04 — End: 2017-06-04

## 2017-06-04 MED ORDER — POLYETHYLENE GLYCOL 3350 17 G PO PACK
17.00 | PACK | ORAL | Status: DC
Start: 2017-06-05 — End: 2017-06-04

## 2017-06-04 MED ORDER — GABAPENTIN 100 MG PO CAPS
100.00 | ORAL_CAPSULE | ORAL | Status: DC
Start: 2017-06-04 — End: 2017-06-04

## 2017-06-04 MED ORDER — BELLADONNA-OPIUM 16.2-30 MG RE SUPP
1.00 | RECTAL | Status: DC
Start: ? — End: 2017-06-04

## 2017-06-04 MED ORDER — METOPROLOL SUCCINATE ER 25 MG PO TB24
25.00 | ORAL_TABLET | ORAL | Status: DC
Start: 2017-06-05 — End: 2017-06-04

## 2017-06-04 MED ORDER — OXYBUTYNIN CHLORIDE 5 MG PO TABS
5.00 | ORAL_TABLET | ORAL | Status: DC
Start: ? — End: 2017-06-04

## 2017-06-04 MED ORDER — OXYCODONE HCL 5 MG PO TABS
5.00 | ORAL_TABLET | ORAL | Status: DC
Start: ? — End: 2017-06-04

## 2017-06-04 MED ORDER — ENOXAPARIN SODIUM 40 MG/0.4ML ~~LOC~~ SOLN
40.00 | SUBCUTANEOUS | Status: DC
Start: 2017-06-05 — End: 2017-06-04

## 2017-07-08 ENCOUNTER — Telehealth: Payer: Self-pay | Admitting: Internal Medicine

## 2017-07-08 NOTE — Telephone Encounter (Signed)
It can be normal to have some drainage--but if it is red and painful, it could be infected. I am out this afternoon, but I will add him tomorrow morning if he needs to be seen

## 2017-07-08 NOTE — Telephone Encounter (Signed)
Spoke to pt. He said he may try to go to [facility] this afternoon. He will let me know if he cannot go.

## 2017-07-08 NOTE — Telephone Encounter (Signed)
Copied from [place] [id number]. Topic: Quick Communication - See Telephone Encounter >> Jul 08, 2017 11:54 AM Synthia Innocent wrote: CRM for notification. See Telephone encounter for: 07/08/17. Patient had prostate removed on 06/03/17, incisions on abdomen are infected with puss coming out. Called the [facility] and was told this was normal. Would like Dr Alla German advice. Please advise

## 2017-07-09 NOTE — Telephone Encounter (Signed)
Spoke to pt. He was seen at [facility]. He was diagnosed with a surgical wound infection and a UTI. He was placed on antibiotics and is already doing a little better.

## 2017-07-09 NOTE — Telephone Encounter (Signed)
Glad he was able to be seen and start on treatment

## 2017-09-19 ENCOUNTER — Other Ambulatory Visit: Payer: Self-pay | Admitting: Internal Medicine

## 2017-10-06 ENCOUNTER — Other Ambulatory Visit: Payer: Self-pay

## 2017-10-06 ENCOUNTER — Ambulatory Visit: Payer: Managed Care, Other (non HMO) | Attending: Urology | Admitting: Physical Therapy

## 2017-10-06 ENCOUNTER — Encounter: Payer: Self-pay | Admitting: Physical Therapy

## 2017-10-06 DIAGNOSIS — M6281 Muscle weakness (generalized): Secondary | ICD-10-CM | POA: Diagnosis present

## 2017-10-06 DIAGNOSIS — R2689 Other abnormalities of gait and mobility: Secondary | ICD-10-CM | POA: Diagnosis present

## 2017-10-06 DIAGNOSIS — R278 Other lack of coordination: Secondary | ICD-10-CM | POA: Insufficient documentation

## 2017-10-06 DIAGNOSIS — C61 Malignant neoplasm of prostate: Secondary | ICD-10-CM | POA: Insufficient documentation

## 2017-10-06 NOTE — Patient Instructions (Addendum)
    Bladder irritant: water  6:1    2-3 glasses ( 12-16 floz) ice tea,  2 -3 cups (12 fl oz)  of coffee:  1 glass of water ( 16 floz) at night,  _______  Start with balancing bladder irritants and water   1 glass of room temp water in the morning,  bring a tumbler to work ( 1 x 16 fl oz container)  1 glass of water night  ___ Total of 3 ( 16 fl oz) water   Decreasing bladder irritants: Sacrifice tea in the afternoon and coffee in the morning -  ____ Total 2 servings of bladder irritants ( 16 floz)    Bladder irritant: water  2:3     ______   Decrease downward forces onto pelvic floor: Log roll out of bed instead of jumping out of bed with a crunch Hold off on crunches and sit ups  Exhale as you lift or as you get out of the chair (against gravity and loads)  Sitting posture ( feet on the ground )    Minisquat:  Also use these form when bending to dog/ feeding him and also at hourly break at work    Scoot buttocks back slight, hinge like you are looking at your reflection on a pond  Knees behind toes,  Inhale to "smell flowers"  Exhale on the rise "like rocket"  Do not lock knees, have more weight across ballmounds of feet, toes relaxed   10 reps x 3 x day    _______  Work station: bring back a picture of your desk in profile  (handout of ergonomics)

## 2017-10-07 NOTE — Therapy (Signed)
[place] MAIN [facility] SERVICES [address] [place], [place], [address] Phone: [phone]   Fax:  [phone]  Physical Therapy Evaluation  Patient Details  Name: Justin Terrell MRN: [id number] Date of Birth: 1955/12/15 Referring Provider: Junious Silk MD   Encounter Date: 10/06/2017  PT End of Session - 10/06/17 1706    Visit Number  1    Number of Visits  12    PT Start Time  5176    PT Stop Time  1707    PT Time Calculation (min)  54 min       Past Medical History:  Diagnosis Date  . AVM (arteriovenous malformation) of ascending colon 12/25/2012  . Coronary atherosclerosis of native coronary artery 05/26/2015  . GERD (gastroesophageal reflux disease)    aspirin sensitive, prn meds only  . Hyperlipidemia   . Hypertension   . Lumbar disc disease   . Nephrolithiasis   . Personal history of colonic adenoma 12/30/2012  . Ureteral duplication, right     Past Surgical History:  Procedure Laterality Date  . ANGIOPLASTY     thigh  . BACK SURGERY    . CARDIAC CATHETERIZATION N/A 07/05/2014   Procedure: Left Heart Cath and Coronary Angiography;  Surgeon: Isaias Cowman, MD;  Location: [place] [facility];  Service: Cardiovascular;  Laterality: N/A;  . DOPPLER ECHOCARDIOGRAPHY  06/24/07   stress echo normal. Nl baseline EF  . HAND NERVE REPAIR  1970's   both hands  . KNEE ARTHROSCOPY  1990's   right   . [place] SURGERY  1993  . Right leg compound fracture repair  1976    There were no vitals filed for this visit.   Subjective Assessment - 10/06/17 1615    Subjective  Pt reported he underwent prostate surgery on 06/03/17 at [facility]. Pt started experience leakage after surgery and declined physical therapy after the surgery due to a staph infection. Pt sought a second opinion at the [facility] minute clinic to verify the staph infection. Pt completed antibiotics for the infection.  Pt has not gone back to see the surgeon since his  surgery at [facility] because he was always unavailable. Pt went back to see Dr. Alger Simons at [place] who referred pt to pelvic PT.  Dr. Alger Simons has verified the infection has cleared. Pt has urinary leakage during the day but not at night. Denied nocturia. During the day, pt uses 10-15 pads. Pt sits all day at a computer. Pt could get a stand up desk if needed.  Pt has worked outside with building a new deck, walking dogs.  Pt has increased leakage with increased activiteis with sweating in the hot sun.  Daily fluid intake: 2-3 glasses ( 12-16 floz) ice tea, 1 glass ( 16 floz) at night, 2 -3 cups of coffee,        Pertinent History  Hx of lumbar surgery 1993 without PT rehab, R knee surgery, R femur repair.  Denied hernias. Used to perform sit up and crunches in the past ans also after surgery . Pt found them to not help with the leakage.  Physical activity: walking dog 1/4 mi.  Sedentary desk job.     Patient Stated Goals  Have more energy to walk 1 mile, decrease leakage          [facility] PT Assessment - 10/07/17 2137      Assessment   Medical Diagnosis  prostate cancer    Referring Provider  Eskridge MD  Precautions   Precautions  None      Restrictions   Weight Bearing Restrictions  No      Balance Screen   Has the patient fallen in the past 6 months  No      Coordination   Gross Motor Movements are Fluid and Coordinated  --   limited rib expansion    Fine Motor Movements are Fluid and Coordinated  --   minimal pelvic floor movement w/cue pelvic floor contraction     Other:   Other/ Comments  bending with minimal lower kinetic chain, excessive spinal flexion      Posture/Postural Control   Posture Comments  slouch sitting       Palpation   Spinal mobility  increased hypomobility at thoracic spine    Palpation comment  scar restrictions over abdomen       Bed Mobility   Bed Mobility  --   crunch method               Objective measurements completed on  examination: See above findings.      [facility] Adult PT Treatment/Exercise - 10/07/17 2137      Therapeutic Activites    Therapeutic Activities  --   see pt instructions     Neuro Re-ed    Neuro Re-ed Details   see pt instructions             PT Education - 10/07/17 2146    Education Details  POC, anatomy, physiology, goals, HEP    Person(s) Educated  Patient    Methods  Explanation;Demonstration;Tactile cues;Verbal cues;Handout    Comprehension  Returned demonstration;Verbalized understanding;Verbal cues required;Tactile cues required          PT Long Term Goals - 10/07/17 2133      PT LONG TERM GOAL #1   Title  Pt will decrease his IPPS score to < 7 pts ( mild ) in order to perform yard activities with more continence    Baseline  19 ( moderate)     Time  12    Period  Weeks    Status  New    Target Date  12/30/17      PT LONG TERM GOAL #2   Title  Pt will decrease his IPPS QOL score to < 2 pt (mostly satisfied)     Baseline  most dissatistfied (4)     Time  10    Period  Weeks    Status  New    Target Date  12/16/17      PT LONG TERM GOAL #3   Title  Pt will demo decreased scar restrictions over abdomen to increase fascial mobility and progress towards deep core strengthening for postural stability    Time  6    Period  Weeks    Status  New    Target Date  11/18/17      PT LONG TERM GOAL #4   Title  Pt will demo proper techiques and alignment with HEP, fitness exercises, functional activities to minimize strain onto this pelvic floor and abdominal mm to improve continence    Time  12    Period  Weeks    Status  New    Target Date  12/30/17      PT LONG TERM GOAL #5   Title  Pt will demo proper deep core/ pelvic floor ROM/ coordination and pelvic strength Grade 3/3/3/3 in order to increase intraabdominal pressures system to minmize leakage  Time  6    Period  Weeks    Status  New    Target Date  11/18/17             Plan - 10/07/17 2132     Clinical Impression Statement Pt is a 62 yo male who is concerned about his urinary incontinence s/p post-prostate surgery 06/13/17. Pt 's clinical presentations included signs of poor intraabdominal pressure system:  _dyscoordination of deep core mm _poor posture/ forward head posture _lack of understanding on exercises that places less strain on the abdomen/pelvic floor _poor body mechanics/posture  in functional tasks that place downward forces onto his pelvic floor _abdominal scar restrictions _hypomobility of spine , deep core and hip weakness  Additional factors that contribute to his urinary Sx also include: significant intake of bladder irritants and minimal water and having performed repeated sit-ups which pt didn't find to help with his incontinence. Following Tx, pt demo'd improved body mechanics. Pt was provided information about decreasing bladder irritants. Using motivational interviewing, pt and DPT strategized to optimize water intake for improved bladder health.        Clinical Presentation  Evolving    Clinical Decision Making  Moderate    Rehab Potential  Fair    PT Frequency  1x / week    PT Duration  12 weeks    PT Treatment/Interventions  Balance training;Gait training;Biofeedback;Functional mobility training;Stair training;Neuromuscular re-education;Therapeutic activities;Patient/family education;Manual techniques;Scar mobilization;Therapeutic exercise;Taping;Aquatic Therapy;Moist Heat;Cryotherapy;Traction    Consulted and Agree with Plan of Care  Patient       Patient will benefit from skilled therapeutic intervention in order to improve the following deficits and impairments:  Improper body mechanics, Pain, Increased fascial restricitons, Increased muscle spasms, Decreased scar mobility, Decreased mobility, Decreased coordination, Decreased balance, Decreased activity tolerance, Decreased endurance, Decreased range of motion, Decreased strength, Hypomobility,  Decreased safety awareness, Postural dysfunction, Other (comment)  Visit Diagnosis: Other lack of coordination  Muscle weakness (generalized)  Other abnormalities of gait and mobility     Problem List Patient Active Problem List   Diagnosis Date Noted  . Prostate cancer ([place]) 10/06/2017  . Elevated PSA 09/12/2016  . Coronary atherosclerosis of native coronary artery 05/26/2015  . NSTEMI (non-ST elevated myocardial infarction) ([place]) 07/05/2014  . Hyperkalemia 07/04/2014  . Personal history of colonic adenoma 12/30/2012  . AVM (arteriovenous malformation) of ascending colon 12/25/2012  . Routine general medical examination at a health care facility 04/23/2012  . Hypertension   . Hyperlipidemia   . Lumbar disc disease   . GERD (gastroesophageal reflux disease)     Jerl Mina ,PT, DPT, E-RYT  10/07/2017, 9:48 PM  [place] MAIN [facility] SERVICES [address] [place], [place], [address] Phone: [phone]   Fax:  [phone]  Name: Justin Terrell MRN: [id number] Date of Birth: 1955-10-17

## 2017-10-15 ENCOUNTER — Ambulatory Visit: Payer: Managed Care, Other (non HMO) | Admitting: Physical Therapy

## 2017-10-15 DIAGNOSIS — R2689 Other abnormalities of gait and mobility: Secondary | ICD-10-CM

## 2017-10-15 DIAGNOSIS — R278 Other lack of coordination: Secondary | ICD-10-CM | POA: Diagnosis not present

## 2017-10-15 DIAGNOSIS — M6281 Muscle weakness (generalized): Secondary | ICD-10-CM

## 2017-10-15 NOTE — Patient Instructions (Signed)
Open book,  deep core level 1 and 2     Abdominal massage upward from L,  R , center to belly button 3 stroke x 3 , pressure is gentle and light with all fingers flat, not using finger tips

## 2017-10-15 NOTE — Therapy (Signed)
[place] MAIN [facility] SERVICES [address] [place], [place], [address] Phone: [phone]   Fax:  [phone]  Physical Therapy Treatment  Patient Details  Name: Justin Terrell MRN: [id number] Date of Birth: 1955/11/14 Referring Provider: Junious Silk MD   Encounter Date: 10/15/2017  PT End of Session - 10/15/17 1512    Visit Number  2    Number of Visits  12    PT Start Time  5093    PT Stop Time  2671    PT Time Calculation (min)  60 min    Activity Tolerance  Patient tolerated treatment well;No increased pain    Behavior During Therapy  WFL for tasks assessed/performed       Past Medical History:  Diagnosis Date  . AVM (arteriovenous malformation) of ascending colon 12/25/2012  . Coronary atherosclerosis of native coronary artery 05/26/2015  . GERD (gastroesophageal reflux disease)    aspirin sensitive, prn meds only  . Hyperlipidemia   . Hypertension   . Lumbar disc disease   . Nephrolithiasis   . Personal history of colonic adenoma 12/30/2012  . Ureteral duplication, right     Past Surgical History:  Procedure Laterality Date  . ANGIOPLASTY     thigh  . BACK SURGERY    . CARDIAC CATHETERIZATION N/A 07/05/2014   Procedure: Left Heart Cath and Coronary Angiography;  Surgeon: Isaias Cowman, MD;  Location: [place] [facility];  Service: Cardiovascular;  Laterality: N/A;  . DOPPLER ECHOCARDIOGRAPHY  06/24/07   stress echo normal. Nl baseline EF  . HAND NERVE REPAIR  1970's   both hands  . KNEE ARTHROSCOPY  1990's   right   . [place] SURGERY  1993  . Right leg compound fracture repair  1976    There were no vitals filed for this visit.  Subjective Assessment - 10/15/17 1507    Subjective  Pt reports he has done his exercises. Pt feels R lower abdominal pain occasionally.     Pertinent History  Hx of lumbar surgery 1993 without PT rehab, R knee surgery, R femur repair.  Denied hernias. Used to perform sit up and  crunches in the past ans also after surgery . Pt found them to not help with the leakage.  Physical activity: walking dog 1/4 mi.  Sedentary desk job.     Patient Stated Goals  Have more energy to walk 1 mile, decrease leakage          [facility] PT Assessment - 10/15/17 1508      Coordination   Gross Motor Movements are Fluid and Coordinated  --   excessive abdomninal strain with inhalation.   Fine Motor Movements are Fluid and Coordinated  --   improved lateral excursion of diaphragm      Palpation   Palpation comment  tenderness at lower Q on R, and upper lateral to rectus abdominis by the scar with referred pain tot he R lower abdominal mm.  NOted non-soft at this area ( improved post Tx) . Noted scare restrictions above umbilicus/ laproscopic scars L and R quadrants ( improved mobility post Tx                   [facility] Adult PT Treatment/Exercise - 10/15/17 1510      Neuro Re-ed    Neuro Re-ed Details   see pt instructions      Moist Heat Therapy   Number Minutes Moist Heat  5 Minutes  Moist Heat Location  --   abdomen     Manual Therapy   Manual therapy comments  gentle scar releases, myofascial release over scars and abdomen              PT Education - 10/15/17 1511    Education Details  HEP    Person(s) Educated  Patient    Methods  Explanation;Demonstration;Tactile cues;Verbal cues;Handout    Comprehension  Verbalized understanding;Returned demonstration          PT Long Term Goals - 10/07/17 2152      PT LONG TERM GOAL #1   Title  Pt will decrease his IPPS score to < 7 pts ( mild ) in order to perform yard activities with more continence    Baseline  19 ( moderate)     Time  12    Period  Weeks    Status  New      PT LONG TERM GOAL #2   Title  Pt will decrease his IPPS QOL score to < 2 pt (mostly satisfied)     Baseline  most dissatistfied (4)     Time  10    Period  Weeks    Status  New      PT LONG TERM GOAL #3   Title  Pt will  demo decreased scar restrictions over abdomen to increase fascial mobility and progress towards deep core strengthening for postural stability    Time  6    Period  Weeks    Status  New      PT LONG TERM GOAL #4   Title  Pt will demo proper techiques and alignment with HEP, fitness exercises, functional activities to minimize strain onto this pelvic floor and abdominal mm to improve continence    Time  12    Period  Weeks    Status  New      PT LONG TERM GOAL #5   Title  Pt will demo proper deep core/ pelvic floor ROM/ coordination and pelvic strength Grade 3/3/3/3 in order to increase intraabdominal pressures system to minmize leakage    Time  6    Period  Weeks    Status  New      Additional Long Term Goals   Additional Long Term Goals  Yes      PT LONG TERM GOAL #6   Title  Pt will decrease his bladder irritants and increase his water by 50% in order to optimize bladder health    Time  4    Period  Weeks    Status  New      PT LONG TERM GOAL #7   Title  Pt will decrease wearing pads from 10-12x/ day to < 5-6x /day in order to promote perineal hygiene, decrease costs     Time  10    Period  Weeks    Status  New    Target Date  12/16/17            Plan - 10/15/17 1512    Clinical Impression Statement  Pt demo'd decreased fascial restrictions over abdominal scars and showed a softer abdomen with less referred pain to the R quadrant of lower abdominal following fascial releases over scars. Pt demo'd improved diaphragmatic excursion but required cues for less abdominal straining. Pt progressed to deep core strengthening and self-abdominal massage. Pt continues to benefit from skilled PT.     Rehab Potential  Fair    PT Frequency  1x / week    PT Duration  12 weeks    PT Treatment/Interventions  Balance training;Gait training;Biofeedback;Functional mobility training;Stair training;Neuromuscular re-education;Therapeutic activities;Patient/family education;Manual  techniques;Scar mobilization;Therapeutic exercise;Taping;Aquatic Therapy;Moist Heat;Cryotherapy;Traction    Consulted and Agree with Plan of Care  Patient       Patient will benefit from skilled therapeutic intervention in order to improve the following deficits and impairments:  Improper body mechanics, Pain, Increased fascial restricitons, Increased muscle spasms, Decreased scar mobility, Decreased mobility, Decreased coordination, Decreased balance, Decreased activity tolerance, Decreased endurance, Decreased range of motion, Decreased strength, Hypomobility, Decreased safety awareness, Postural dysfunction, Other (comment)  Visit Diagnosis: Other lack of coordination  Muscle weakness (generalized)  Other abnormalities of gait and mobility     Problem List Patient Active Problem List   Diagnosis Date Noted  . Prostate cancer ([place]) 10/06/2017  . Elevated PSA 09/12/2016  . Coronary atherosclerosis of native coronary artery 05/26/2015  . NSTEMI (non-ST elevated myocardial infarction) ([place]) 07/05/2014  . Hyperkalemia 07/04/2014  . Personal history of colonic adenoma 12/30/2012  . AVM (arteriovenous malformation) of ascending colon 12/25/2012  . Routine general medical examination at a health care facility 04/23/2012  . Hypertension   . Hyperlipidemia   . Lumbar disc disease   . GERD (gastroesophageal reflux disease)     Jerl Mina ,PT, DPT, E-RYT  10/15/2017, 4:08 PM  [place] MAIN [facility] SERVICES [address] [place], [place], [address] Phone: [phone]   Fax:  [phone]  Name: Justin Terrell MRN: [id number] Date of Birth: 1955-11-21

## 2017-10-20 ENCOUNTER — Ambulatory Visit: Payer: Managed Care, Other (non HMO) | Admitting: Physical Therapy

## 2017-10-21 ENCOUNTER — Ambulatory Visit: Payer: Managed Care, Other (non HMO) | Admitting: Physical Therapy

## 2017-10-21 DIAGNOSIS — M6281 Muscle weakness (generalized): Secondary | ICD-10-CM

## 2017-10-21 DIAGNOSIS — R278 Other lack of coordination: Secondary | ICD-10-CM | POA: Diagnosis not present

## 2017-10-21 DIAGNOSIS — R2689 Other abnormalities of gait and mobility: Secondary | ICD-10-CM

## 2017-10-21 NOTE — Patient Instructions (Signed)
Add pelvic floor squeeze at the end of exhale with Deep core level 1  10 reps    Deep core level 2 ( less rocking of pelvis and 20-30 deg of knee out)   Notice movement of pelvic floor lowering on inhale, natural lift on exhale

## 2017-10-21 NOTE — Therapy (Signed)
[place] MAIN [facility] SERVICES [address] [place], [place], [address] Phone: [phone]   Fax:  [phone]  Physical Therapy Treatment  Patient Details  Name: Justin Terrell MRN: [id number] Date of Birth: 1955-11-29 Referring Provider: Junious Silk MD   Encounter Date: 10/21/2017  PT End of Session - 10/21/17 1606    Visit Number  3    Number of Visits  12    PT Start Time  6962    PT Stop Time  9528    PT Time Calculation (min)  52 min    Activity Tolerance  Patient tolerated treatment well;No increased pain    Behavior During Therapy  WFL for tasks assessed/performed       Past Medical History:  Diagnosis Date  . AVM (arteriovenous malformation) of ascending colon 12/25/2012  . Coronary atherosclerosis of native coronary artery 05/26/2015  . GERD (gastroesophageal reflux disease)    aspirin sensitive, prn meds only  . Hyperlipidemia   . Hypertension   . Lumbar disc disease   . Nephrolithiasis   . Personal history of colonic adenoma 12/30/2012  . Ureteral duplication, right     Past Surgical History:  Procedure Laterality Date  . ANGIOPLASTY     thigh  . BACK SURGERY    . CARDIAC CATHETERIZATION N/A 07/05/2014   Procedure: Left Heart Cath and Coronary Angiography;  Surgeon: Isaias Cowman, MD;  Location: [place] [facility];  Service: Cardiovascular;  Laterality: N/A;  . DOPPLER ECHOCARDIOGRAPHY  06/24/07   stress echo normal. Nl baseline EF  . HAND NERVE REPAIR  1970's   both hands  . KNEE ARTHROSCOPY  1990's   right   . [place] SURGERY  1993  . Right leg compound fracture repair  1976    There were no vitals filed for this visit.  Subjective Assessment - 10/21/17 1517    Subjective  Pt has not felt the R abdominal pain since the last session. Pt noticed he had done a bunch sit-ups after the deep core exercises.     Pertinent History  Hx of lumbar surgery 1993 without PT rehab, R knee surgery, R femur repair.   Denied hernias. Used to perform sit up and crunches in the past ans also after surgery . Pt found them to not help with the leakage.  Physical activity: walking dog 1/4 mi.  Sedentary desk job.     Patient Stated Goals  Have more energy to walk 1 mile, decrease leakage          [facility] PT Assessment - 10/21/17 1603      Palpation   Palpation comment  no referred pain at R abdominal scar,  moderate restrictions at all scars compared to last session                Pelvic Floor Special Questions - 10/21/17 1602    External Perineal Exam  pt demo'd excessive abdominal straining with downward movement of pelvic floor with cue for pelvic floor mm     External Palpation  post Tx: Grade 4/5, 1 sec, 10 reps         [facility] Adult PT Treatment/Exercise - 10/21/17 1603      Manual Therapy   Manual therapy comments  gentle scar releases, myofascial release over scars and abdomen              PT Education - 10/21/17 1605    Education Details  HEP    Person(s) Educated  Patient    Methods  Explanation;Demonstration;Verbal cues;Handout;Tactile cues    Comprehension  Verbalized understanding;Returned demonstration;Need further instruction          PT Long Term Goals - 10/07/17 2152      PT LONG TERM GOAL #1   Title  Pt will decrease his IPPS score to < 7 pts ( mild ) in order to perform yard activities with more continence    Baseline  19 ( moderate)     Time  12    Period  Weeks    Status  New      PT LONG TERM GOAL #2   Title  Pt will decrease his IPPS QOL score to < 2 pt (mostly satisfied)     Baseline  most dissatistfied (4)     Time  10    Period  Weeks    Status  New      PT LONG TERM GOAL #3   Title  Pt will demo decreased scar restrictions over abdomen to increase fascial mobility and progress towards deep core strengthening for postural stability    Time  6    Period  Weeks    Status  New      PT LONG TERM GOAL #4   Title  Pt will demo proper techiques  and alignment with HEP, fitness exercises, functional activities to minimize strain onto this pelvic floor and abdominal mm to improve continence    Time  12    Period  Weeks    Status  New      PT LONG TERM GOAL #5   Title  Pt will demo proper deep core/ pelvic floor ROM/ coordination and pelvic strength Grade 3/3/3/3 in order to increase intraabdominal pressures system to minmize leakage    Time  6    Period  Weeks    Status  New      Additional Long Term Goals   Additional Long Term Goals  Yes      PT LONG TERM GOAL #6   Title  Pt will decrease his bladder irritants and increase his water by 50% in order to optimize bladder health    Time  4    Period  Weeks    Status  New      PT LONG TERM GOAL #7   Title  Pt will decrease wearing pads from 10-12x/ day to < 5-6x /day in order to promote perineal hygiene, decrease costs     Time  10    Period  Weeks    Status  New    Target Date  12/16/17            Plan - 10/21/17 1606    Clinical Impression Statement  Pt is progressing well with decreased abdominal scar restrictions, proper pelvic floor movement following Tx. Pt progressed to quick contractions for 10 reps after pt demo'd correct technique without straining with overuse of abdomnial mm. Pt continues to benefit from skilled PT.     Rehab Potential  Fair    PT Frequency  1x / week    PT Duration  12 weeks    PT Treatment/Interventions  Balance training;Gait training;Biofeedback;Functional mobility training;Stair training;Neuromuscular re-education;Therapeutic activities;Patient/family education;Manual techniques;Scar mobilization;Therapeutic exercise;Taping;Aquatic Therapy;Moist Heat;Cryotherapy;Traction    Consulted and Agree with Plan of Care  Patient       Patient will benefit from skilled therapeutic intervention in order to improve the following deficits and impairments:  Improper body mechanics, Pain, Increased fascial restricitons,  Increased muscle spasms,  Decreased scar mobility, Decreased mobility, Decreased coordination, Decreased balance, Decreased activity tolerance, Decreased endurance, Decreased range of motion, Decreased strength, Hypomobility, Decreased safety awareness, Postural dysfunction, Other (comment)  Visit Diagnosis: Other lack of coordination  Muscle weakness (generalized)  Other abnormalities of gait and mobility     Problem List Patient Active Problem List   Diagnosis Date Noted  . Prostate cancer ([place]) 10/06/2017  . Elevated PSA 09/12/2016  . Coronary atherosclerosis of native coronary artery 05/26/2015  . NSTEMI (non-ST elevated myocardial infarction) ([place]) 07/05/2014  . Hyperkalemia 07/04/2014  . Personal history of colonic adenoma 12/30/2012  . AVM (arteriovenous malformation) of ascending colon 12/25/2012  . Routine general medical examination at a health care facility 04/23/2012  . Hypertension   . Hyperlipidemia   . Lumbar disc disease   . GERD (gastroesophageal reflux disease)     Jerl Mina ,PT, DPT, E-RYT  10/21/2017, 4:08 PM  [place] MAIN [facility] SERVICES [address] [place], [place], [address] Phone: [phone]   Fax:  [phone]  Name: JESON CAMACHO MRN: [id number] Date of Birth: [birth year]

## 2017-10-31 ENCOUNTER — Ambulatory Visit: Payer: Managed Care, Other (non HMO) | Attending: Urology | Admitting: Physical Therapy

## 2017-11-27 ENCOUNTER — Ambulatory Visit: Payer: Managed Care, Other (non HMO) | Attending: Urology | Admitting: Physical Therapy

## 2017-12-04 ENCOUNTER — Ambulatory Visit: Payer: Managed Care, Other (non HMO) | Admitting: Physical Therapy

## 2017-12-11 ENCOUNTER — Ambulatory Visit: Payer: Managed Care, Other (non HMO) | Admitting: Physical Therapy

## 2017-12-18 ENCOUNTER — Other Ambulatory Visit: Payer: Self-pay | Admitting: Internal Medicine

## 2018-01-05 ENCOUNTER — Encounter: Payer: Self-pay | Admitting: Internal Medicine

## 2018-01-29 ENCOUNTER — Encounter: Payer: Self-pay | Admitting: Internal Medicine

## 2018-01-29 ENCOUNTER — Ambulatory Visit (AMBULATORY_SURGERY_CENTER): Payer: Self-pay

## 2018-01-29 VITALS — Ht 72.0 in | Wt 186.2 lb

## 2018-01-29 DIAGNOSIS — Z86010 Personal history of colonic polyps: Secondary | ICD-10-CM

## 2018-01-29 NOTE — Progress Notes (Signed)
Denies allergies to eggs or soy products. Denies complication of anesthesia or sedation. Denies use of weight loss medication. Denies use of O2.   Emmi instructions declined.  

## 2018-02-11 ENCOUNTER — Ambulatory Visit (AMBULATORY_SURGERY_CENTER): Payer: Managed Care, Other (non HMO) | Admitting: Internal Medicine

## 2018-02-11 ENCOUNTER — Encounter: Payer: Self-pay | Admitting: Internal Medicine

## 2018-02-11 VITALS — BP 117/68 | HR 73 | Temp 96.4°F | Resp 19 | Ht 72.0 in | Wt 186.0 lb

## 2018-02-11 DIAGNOSIS — D125 Benign neoplasm of sigmoid colon: Secondary | ICD-10-CM

## 2018-02-11 DIAGNOSIS — K5520 Angiodysplasia of colon without hemorrhage: Secondary | ICD-10-CM

## 2018-02-11 DIAGNOSIS — Z86010 Personal history of colonic polyps: Secondary | ICD-10-CM | POA: Diagnosis present

## 2018-02-11 DIAGNOSIS — K635 Polyp of colon: Secondary | ICD-10-CM

## 2018-02-11 MED ORDER — SODIUM CHLORIDE 0.9 % IV SOLN: 500.0000 mL | Freq: Once | INTRAVENOUS | Status: DC

## 2018-02-11 NOTE — Progress Notes (Signed)
Pt's states no medical or surgical changes since previsit or office visit. 

## 2018-02-11 NOTE — Op Note (Signed)
[facility] Patient Name: Damarie Schoolfield Procedure Date: 02/11/2018 1:50 PM MRN: [id number] Endoscopist: Gatha Mayer , MD Age: 62 Referring MD:  Date of Birth: 03/27/1955 Gender: Male Account #: [id number] Procedure:                Colonoscopy Indications:              Surveillance: Personal history of adenomatous                            polyps on last colonoscopy 5 years ago Medicines:                Propofol per Anesthesia, Monitored Anesthesia Care Procedure:                Pre-Anesthesia Assessment:                           - Prior to the procedure, a History and Physical                            was performed, and patient medications and                            allergies were reviewed. The patient's tolerance of                            previous anesthesia was also reviewed. The risks                            and benefits of the procedure and the sedation                            options and risks were discussed with the patient.                            All questions were answered, and informed consent                            was obtained. Prior Anticoagulants: The patient has                            taken no previous anticoagulant or antiplatelet                            agents. ASA Grade Assessment: III - A patient with                            severe systemic disease. After reviewing the risks                            and benefits, the patient was deemed in                            satisfactory condition to undergo the procedure.  After obtaining informed consent, the colonoscope                            was passed under direct vision. Throughout the                            procedure, the patient's blood pressure, pulse, and                            oxygen saturations were monitored continuously. The                            Colonoscope was introduced through the anus and                             advanced to the the cecum, identified by                            appendiceal orifice and ileocecal valve. The                            colonoscopy was performed without difficulty. The                            patient tolerated the procedure well. The quality                            of the bowel preparation was good. The ileocecal                            valve, appendiceal orifice, and rectum were                            photographed. The bowel preparation used was                            Miralax. Scope In: 2:11:22 PM Scope Out: 2:28:02 PM Scope Withdrawal Time: 0 hours 12 minutes 15 seconds  Total Procedure Duration: 0 hours 16 minutes 40 seconds  Findings:                 The perianal examination was normal.                           The digital rectal exam findings include surgically                            absent prostate.                           A diminutive polyp was found in the proximal                            sigmoid colon. The polyp was sessile. The polyp was  removed with a cold snare. Resection and retrieval                            were complete. Verification of patient                            identification for the specimen was done. Estimated                            blood loss was minimal.                           Multiple medium-sized angiodysplastic lesions                            without bleeding were found in the ascending colon                            and in the cecum.                           Multiple small-mouthed diverticula were found in                            the sigmoid colon.                           The exam was otherwise without abnormality on                            direct and retroflexion views. Complications:            No immediate complications. Estimated Blood Loss:     Estimated blood loss was minimal. Impression:               - A surgically absent prostate found on digital                             rectal exam.                           - One diminutive polyp in the proximal sigmoid                            colon, removed with a cold snare. Resected and                            retrieved.                           - Multiple non-bleeding colonic angiodysplastic                            lesions. 2-7 mm size cecum and ascending                           - Diverticulosis in the sigmoid  colon.                           - The examination was otherwise normal on direct                            and retroflexion views.                           - Personal history of colonic polyp - adenoma 2014. Recommendation:           - Patient has a contact number available for                            emergencies. The signs and symptoms of potential                            delayed complications were discussed with the                            patient. Return to normal activities tomorrow.                            Written discharge instructions were provided to the                            patient.                           - Resume previous diet.                           - Continue present medications.                           - Repeat colonoscopy is recommended. The                            colonoscopy date will be determined after pathology                            results from today's exam become available for                            review. Gatha Mayer, MD 02/11/2018 2:45:32 PM This report has been signed electronically.

## 2018-02-11 NOTE — Progress Notes (Signed)
Called to room to assist during endoscopic procedure.  Patient ID and intended procedure confirmed with present staff. Received instructions for my participation in the procedure from the performing physician.  

## 2018-02-11 NOTE — Patient Instructions (Addendum)
   I found and removed one tiny polyp.  I will let you know pathology results and when to have another routine colonoscopy by mail and/or My Chart.  I appreciate the opportunity to care for you. Gatha Mayer, MD, FACG   YOU HAD AN ENDOSCOPIC PROCEDURE TODAY AT [facility]:   Refer to the procedure report that was given to you for any specific questions about what was found during the examination.  If the procedure report does not answer your questions, please call your gastroenterologist to clarify.  If you requested that your care partner not be given the details of your procedure findings, then the procedure report has been included in a sealed envelope for you to review at your convenience later.  YOU SHOULD EXPECT: Some feelings of bloating in the abdomen. Passage of more gas than usual.  Walking can help get rid of the air that was put into your GI tract during the procedure and reduce the bloating. If you had a lower endoscopy (such as a colonoscopy or flexible sigmoidoscopy) you may notice spotting of blood in your stool or on the toilet paper. If you underwent a bowel prep for your procedure, you may not have a normal bowel movement for a few days.  Please Note:  You might notice some irritation and congestion in your nose or some drainage.  This is from the oxygen used during your procedure.  There is no need for concern and it should clear up in a day or so.  SYMPTOMS TO REPORT IMMEDIATELY:   Following lower endoscopy (colonoscopy or flexible sigmoidoscopy):  Excessive amounts of blood in the stool  Significant tenderness or worsening of abdominal pains  Swelling of the abdomen that is new, acute  Fever of 100F or higher   For urgent or emergent issues, a gastroenterologist can be reached at any hour by calling [phone].   DIET:  We do recommend a small meal at first, but then you may proceed to your regular diet.  Drink plenty of fluids but you  should avoid alcoholic beverages for 24 hours.  ACTIVITY:  You should plan to take it easy for the rest of today and you should NOT DRIVE or use heavy machinery until tomorrow (because of the sedation medicines used during the test).    FOLLOW UP: Our staff will call the number listed on your records the next business day following your procedure to check on you and address any questions or concerns that you may have regarding the information given to you following your procedure. If we do not reach you, we will leave a message.  However, if you are feeling well and you are not experiencing any problems, there is no need to return our call.  We will assume that you have returned to your regular daily activities without incident.  If any biopsies were taken you will be contacted by phone or by letter within the next 1-3 weeks.  Please call us at [phone] if you have not heard about the biopsies in 3 weeks.    SIGNATURES/CONFIDENTIALITY: You and/or your care partner have signed paperwork which will be entered into your electronic medical record.  These signatures attest to the fact that that the information above on your After Visit Summary has been reviewed and is understood.  Full responsibility of the confidentiality of this discharge information lies with you and/or your care-partner.

## 2018-02-11 NOTE — Progress Notes (Signed)
PT taken to PACU. Monitors in place. VSS. Report given to RN. 

## 2018-02-12 ENCOUNTER — Telehealth: Payer: Self-pay | Admitting: *Deleted

## 2018-02-12 ENCOUNTER — Telehealth: Payer: Self-pay

## 2018-02-12 NOTE — Telephone Encounter (Signed)
  Follow up Call-  Call back number 02/11/2018  Post procedure Call Back phone  # [phone]  Permission to leave phone message Yes  Some recent data might be hidden     Patient questions:  Message left to call us if necessary.

## 2018-02-12 NOTE — Telephone Encounter (Signed)
Follow up call,name identifier, left a voicemail.

## 2018-02-15 ENCOUNTER — Other Ambulatory Visit: Payer: Self-pay | Admitting: Internal Medicine

## 2018-02-23 ENCOUNTER — Telehealth: Payer: Self-pay | Admitting: Urology

## 2018-02-23 ENCOUNTER — Encounter: Payer: Self-pay | Admitting: Internal Medicine

## 2018-02-23 NOTE — Telephone Encounter (Signed)
Left message for pt to cb back  I need to speak with him about his my chart message   Sharyn Lull

## 2018-02-23 NOTE — Progress Notes (Signed)
Diminutive adenoma and hx prior adenoma  recall 2019 5 yrs My Chart

## 2018-03-20 ENCOUNTER — Encounter: Payer: Self-pay | Admitting: Internal Medicine

## 2018-03-20 ENCOUNTER — Ambulatory Visit (INDEPENDENT_AMBULATORY_CARE_PROVIDER_SITE_OTHER): Payer: BLUE CROSS/BLUE SHIELD | Admitting: Internal Medicine

## 2018-03-20 VITALS — BP 134/80 | HR 76 | Temp 97.5°F | Ht 72.0 in | Wt 188.0 lb

## 2018-03-20 DIAGNOSIS — Z8546 Personal history of malignant neoplasm of prostate: Secondary | ICD-10-CM

## 2018-03-20 DIAGNOSIS — I10 Essential (primary) hypertension: Secondary | ICD-10-CM

## 2018-03-20 DIAGNOSIS — I2510 Atherosclerotic heart disease of native coronary artery without angina pectoris: Secondary | ICD-10-CM

## 2018-03-20 DIAGNOSIS — E785 Hyperlipidemia, unspecified: Secondary | ICD-10-CM | POA: Diagnosis not present

## 2018-03-20 MED ORDER — METOPROLOL SUCCINATE ER 25 MG PO TB24: 25.0000 mg | ORAL_TABLET | Freq: Every day | ORAL | 3 refills | Status: DC

## 2018-03-20 MED ORDER — ATORVASTATIN CALCIUM 40 MG PO TABS: 40.0000 mg | ORAL_TABLET | Freq: Every day | ORAL | 3 refills | Status: DC

## 2018-03-20 MED ORDER — VALSARTAN 160 MG PO TABS: 160.0000 mg | ORAL_TABLET | Freq: Every day | ORAL | 3 refills | Status: DC

## 2018-03-20 NOTE — Assessment & Plan Note (Signed)
Prostatectomy 9 months ago On surveillance now

## 2018-03-20 NOTE — Progress Notes (Signed)
Subjective:    Patient ID: Justin Terrell, male    DOB: 24-Apr-1955, 63 y.o.   MRN: [id number]  HPI Here for follow up of HTN and other chronic medical issues  Did have total prostatectomy Fairly localized--no other Rx PSA monitoring still low Some incontinence ED---has tried oral medications  BP has been okay Has had mild cough for years--goes back to first starting Now every morning and worse since dose increase  Ran out of atorvastatin  Some chest pain--from the cough Different areas---and resolves Occasional SOB which are only with the coughing spells Does walk daily and no change in exercise tolerance No palpitations No dizziness or syncope  Current Outpatient Medications on File Prior to Visit  Medication Sig Dispense Refill  . aspirin EC 81 MG tablet Take 81 mg by mouth daily.    [name] Kitchen lisinopril (PRINIVIL,ZESTRIL) 20 MG tablet TAKE 1 TABLET BY MOUTH ONCE DAILY (MUST  SCHEDULE  OFFICE  VISIT) 30 tablet 0  . metoprolol succinate (TOPROL-XL) 25 MG 24 hr tablet Take 1 tablet (25 mg total) by mouth daily. 30 tablet 0  . atorvastatin (LIPITOR) 40 MG tablet Take 1 tablet (40 mg total) by mouth daily. (Patient not taking: Reported on 03/20/2018) 90 tablet 3   No current facility-administered medications on file prior to visit.     Allergies  Allergen Reactions  . Codeine Nausea And Vomiting  . Pravastatin Other (See Comments)    Reaction:  Chest pain     Past Medical History:  Diagnosis Date  . AVM (arteriovenous malformation) of ascending colon 12/25/2012  . Cancer [facility])    Prostate  . Coronary atherosclerosis of native coronary artery 05/26/2015  . GERD (gastroesophageal reflux disease)    aspirin sensitive, prn meds only  . Hyperlipidemia   . Hypertension   . Lumbar disc disease   . Myocardial infarction ([place])   . Nephrolithiasis   . Personal history of colonic adenoma 12/30/2012  . Ureteral duplication, right     Past Surgical History:  Procedure Laterality  Date  . ANGIOPLASTY     thigh  . BACK SURGERY    . CARDIAC CATHETERIZATION N/A 07/05/2014   Procedure: Left Heart Cath and Coronary Angiography;  Surgeon: Isaias Cowman, MD;  Location: [facility];  Service: Cardiovascular;  Laterality: N/A;  . DOPPLER ECHOCARDIOGRAPHY  06/24/07   stress echo normal. Nl baseline EF  . HAND NERVE REPAIR  1970's   both hands  . KNEE ARTHROSCOPY  1990's   right   . [place] SURGERY  1993  . PROSTATE SURGERY  05/2017   robotic prostatectomy  . Right leg compound fracture repair  1976    Family History  Problem Relation Age of Onset  . Heart disease Father   . Hypertension Father   . Coronary artery disease Father   . Valvular heart disease Father   . Cancer Mother        colon cancer  . Colon cancer Mother 64  . Throat cancer Mother   . Esophageal cancer Mother   . Diabetes Neg Hx   . Prostate cancer Neg Hx   . Kidney cancer Neg Hx   . Rectal cancer Neg Hx   . Stomach cancer Neg Hx     Social History   Socioeconomic History  . Marital status: Married    Spouse name: Not on file  . Number of children: 0  . Years of education: Not on file  . Highest education  level: Not on file  Occupational History  . Occupation: Information Advertising account planner: [employer]  . Financial resource strain: Not on file  . Food insecurity:    Worry: Not on file    Inability: Not on file  . Transportation needs:    Medical: Not on file    Non-medical: Not on file  Tobacco Use  . Smoking status: Former Smoker    Packs/day: 0.10    Types: Cigarettes    Last attempt to quit: 02/25/2009    Years since quitting: 9.0  . Smokeless tobacco: Never Used  Substance and Sexual Activity  . Alcohol use: No    Alcohol/week: 0.0 standard drinks  . Drug use: No  . Sexual activity: Not on file  Lifestyle  . Physical activity:    Days per week: Not on file    Minutes per session: Not on file  . Stress: Not on file  Relationships  .  Social connections:    Talks on phone: Not on file    Gets together: Not on file    Attends religious service: Not on file    Active member of club or organization: Not on file    Attends meetings of clubs or organizations: Not on file    Relationship status: Not on file  . Intimate partner violence:    Fear of current or ex partner: Not on file    Emotionally abused: Not on file    Physically abused: Not on file    Forced sexual activity: Not on file  Other Topics Concern  . Not on file  Social History Narrative   2nd marriage   Wife has 2 children (1 died)   Works in [place]  Sleeps okay Appetite is fine Weight stable     Objective:   Physical Exam  Constitutional: He appears well-developed. No distress.  Neck: No thyromegaly present.  Cardiovascular: Normal rate, regular rhythm, normal heart sounds and intact distal pulses. Exam reveals no gallop.  No murmur heard. Respiratory: Effort normal and breath sounds normal. No respiratory distress. He has no wheezes.  GI: Soft. There is no abdominal tenderness.  Musculoskeletal:        General: No tenderness or edema.  Lymphadenopathy:    He has no cervical adenopathy.  Skin: No rash noted. No erythema.  Psychiatric: He has a normal mood and affect. His behavior is normal.           Assessment & Plan:

## 2018-03-20 NOTE — Assessment & Plan Note (Signed)
BP Readings from Last 3 Encounters:  03/20/18 134/80  02/11/18 117/68  02/10/17 127/76   Good control but cough with ACEI Given CAD--will change to ARB Check labs 1 month

## 2018-03-20 NOTE — Assessment & Plan Note (Signed)
Needs to restart statin

## 2018-03-20 NOTE — Assessment & Plan Note (Signed)
No angina SOB only due to ACEI cough On appropriate meds Refill statin

## 2018-04-02 DIAGNOSIS — N5201 Erectile dysfunction due to arterial insufficiency: Secondary | ICD-10-CM | POA: Diagnosis not present

## 2018-04-02 DIAGNOSIS — Z8546 Personal history of malignant neoplasm of prostate: Secondary | ICD-10-CM | POA: Diagnosis not present

## 2018-04-02 DIAGNOSIS — N393 Stress incontinence (female) (male): Secondary | ICD-10-CM | POA: Diagnosis not present

## 2018-04-03 DIAGNOSIS — Z8546 Personal history of malignant neoplasm of prostate: Secondary | ICD-10-CM | POA: Diagnosis not present

## 2018-04-07 DIAGNOSIS — J0100 Acute maxillary sinusitis, unspecified: Secondary | ICD-10-CM | POA: Diagnosis not present

## 2018-04-20 ENCOUNTER — Other Ambulatory Visit: Payer: BLUE CROSS/BLUE SHIELD

## 2018-04-30 ENCOUNTER — Ambulatory Visit: Payer: BLUE CROSS/BLUE SHIELD | Admitting: Family Medicine

## 2018-04-30 ENCOUNTER — Encounter: Payer: Self-pay | Admitting: Family Medicine

## 2018-04-30 VITALS — BP 160/84 | HR 71 | Temp 97.9°F | Ht 72.0 in | Wt 184.5 lb

## 2018-04-30 DIAGNOSIS — R2231 Localized swelling, mass and lump, right upper limb: Secondary | ICD-10-CM | POA: Diagnosis not present

## 2018-04-30 DIAGNOSIS — Z8546 Personal history of malignant neoplasm of prostate: Secondary | ICD-10-CM | POA: Diagnosis not present

## 2018-04-30 MED ORDER — DOXYCYCLINE HYCLATE 100 MG PO TABS: 100.0000 mg | ORAL_TABLET | Freq: Two times a day (BID) | ORAL | 0 refills | Status: DC

## 2018-04-30 NOTE — Assessment & Plan Note (Addendum)
Anticipate developing infection of R axillary cyst. Seems rather superficial for lymph node. Nothing really to drain today. Treat with doxycycline course, warm compresses. Call us next week with update. If persists, will consider further evaluation in h/o prostate cancer s/p prostatectomy last year. Pt agrees with plan.

## 2018-04-30 NOTE — Patient Instructions (Addendum)
Looks like infected cyst but nothing to drain today. Treat with antibiotic sent to pharmacy.  Start warm compresses 3 times a day.  Update [place] next week with how you're doing.  Let us know sooner or seek care if streaking redness, fever, or worsening instead of improving.

## 2018-04-30 NOTE — Progress Notes (Signed)
BP (!) 160/84 (BP Location: Right Arm, Patient Position: Sitting, Cuff Size: Normal)   Pulse 71   Temp 97.9 F (36.6 C) (Oral)   Ht 6' (1.829 m)   Wt 184 lb 8 oz (83.7 kg)   SpO2 95%   BMI 25.02 kg/m   BP Readings from Last 3 Encounters:  04/30/18 (!) 160/84  03/20/18 134/80  02/11/18 117/68    CC: check mass R axilla Subjective:    Patient ID: Justin Terrell, male    DOB: 06-24-1955, 63 y.o.   MRN: [id number]  HPI: Justin Terrell is a 63 y.o. male presenting on 04/30/2018 for Cyst (C/o growth in right axillary. Noticed yesterday. Area is painful and large. )   2d ago noticed lump under R arm that is painful. Pain radiates down R arm into elbow. Unsure if redness/warmth. No draining. No fevers/chills. Took hot shower this morning which may have helped. Hasn't tried anything else for this. No h/o skin infections, boils in the past.   No change in deodorant.  No recent skin cuts.   H/o prostate cancer s/p robotic assisted prostatectomy with neg margins ([place]). Lab Results  Component Value Date   PSA 3.9 05/10/2014   PSA 1.9 04/23/2012  latest PSA has been under 1, followed by urology.      Relevant past medical, surgical, family and social history reviewed and updated as indicated. Interim medical history since our last visit reviewed. Allergies and medications reviewed and updated. Outpatient Medications Prior to Visit  Medication Sig Dispense Refill  . aspirin EC 81 MG tablet Take 81 mg by mouth daily.    [name] Kitchen atorvastatin (LIPITOR) 40 MG tablet Take 1 tablet (40 mg total) by mouth daily. 90 tablet 3  . metoprolol succinate (TOPROL-XL) 25 MG 24 hr tablet Take 1 tablet (25 mg total) by mouth daily. 90 tablet 3  . valsartan (DIOVAN) 160 MG tablet Take 1 tablet (160 mg total) by mouth daily. 90 tablet 3   No facility-administered medications prior to visit.      Per HPI unless specifically indicated in ROS section below Review of Systems Objective:    BP (!) 160/84  (BP Location: Right Arm, Patient Position: Sitting, Cuff Size: Normal)   Pulse 71   Temp 97.9 F (36.6 C) (Oral)   Ht 6' (1.829 m)   Wt 184 lb 8 oz (83.7 kg)   SpO2 95%   BMI 25.02 kg/m   Wt Readings from Last 3 Encounters:  04/30/18 184 lb 8 oz (83.7 kg)  03/20/18 188 lb (85.3 kg)  02/11/18 186 lb (84.4 kg)    Physical Exam Vitals signs and nursing note reviewed.  Constitutional:      General: He is not in acute distress.    Appearance: Normal appearance. He is not ill-appearing.  Chest:     Chest wall: No mass.     Breasts:        Right: Normal. No tenderness.        Left: Normal. No tenderness.  Skin:    Comments: Tender firm indurated superficial lump R axilla with mild overlying erythema, no fluctuance or streaking redness.  No chest wall mass noted.  Neurological:     Mental Status: He is alert.       Results for orders placed or performed in visit on 02/07/17  PSA  Result Value Ref Range   Prostate Specific Ag, Serum 14.3 (H) 0.0 - 4.0 ng/mL  PSA undetectable 07/2017 per latest  uro note.  Assessment & Plan:   Problem List Items Addressed This Visit    History of prostate cancer   Axillary lump, right - Primary    Anticipate developing infection of R axillary cyst. Seems rather superficial for lymph node. Nothing really to drain today. Treat with doxycycline course, warm compresses. Call us next week with update. If persists, will consider further evaluation in h/o prostate cancer s/p prostatectomy last year. Pt agrees with plan.           Meds ordered this encounter  Medications  . doxycycline (VIBRA-TABS) 100 MG tablet    Sig: Take 1 tablet (100 mg total) by mouth 2 (two) times daily.    Dispense:  14 tablet    Refill:  0   No orders of the defined types were placed in this encounter.   Follow up plan: No follow-ups on file.  Ria Bush, MD

## 2018-06-22 IMAGING — DX DG CHEST 2V
2 series · 2 of 2 positions shown · non-contrast
Comparison: 07/04/2014

CLINICAL DATA: Follow-up bronchitis

EXAM:
CHEST  2 VIEW

[chest pa]
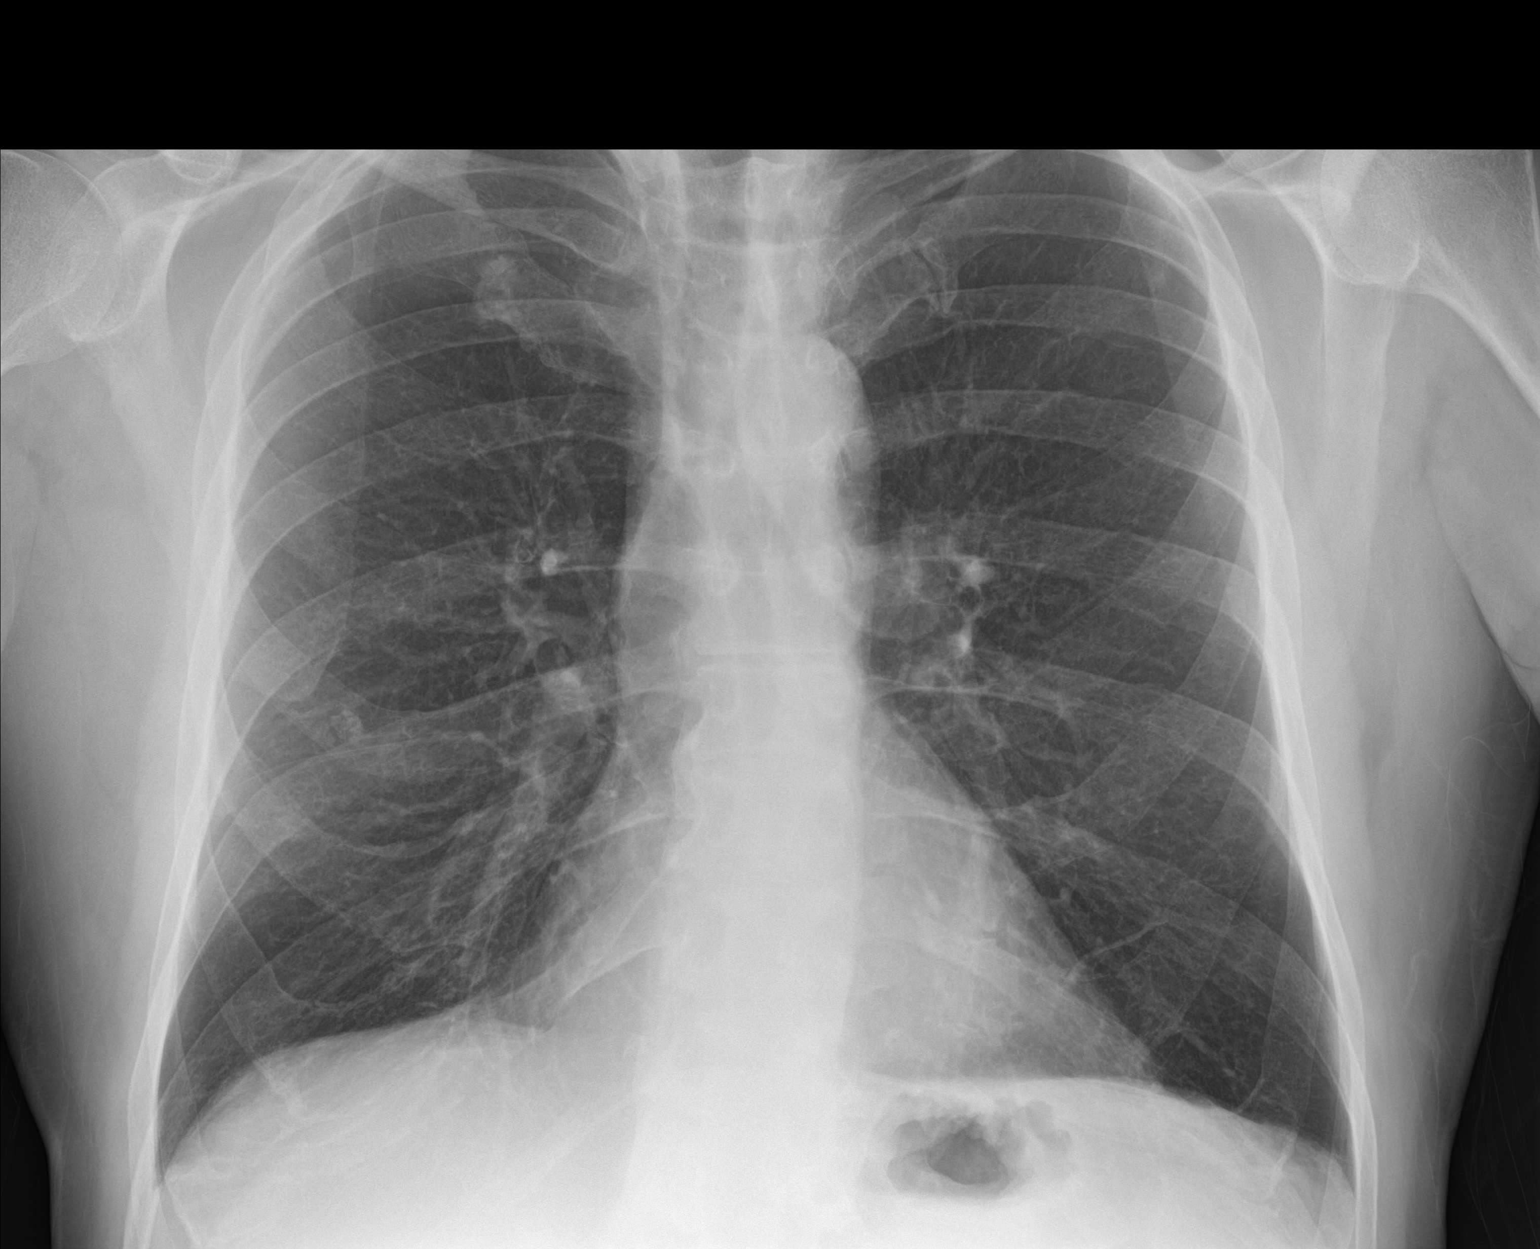

[chest lat]
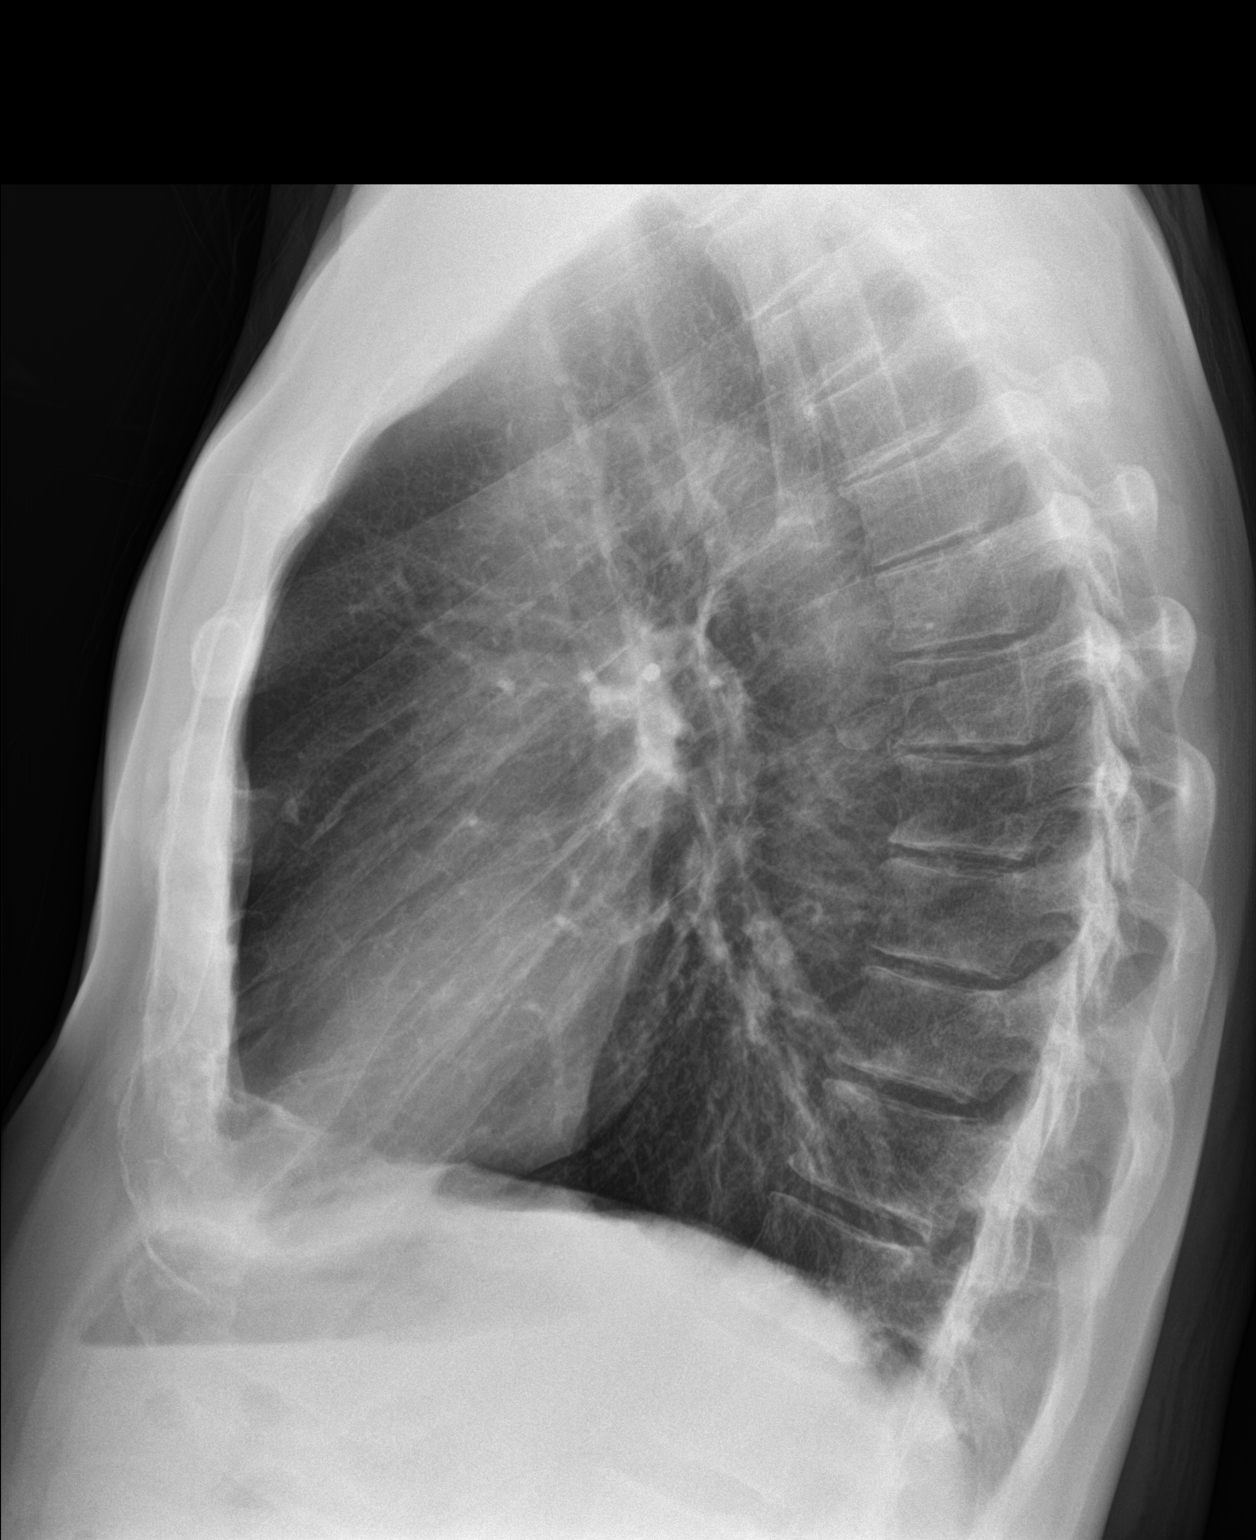

[2 of 2 positions shown; findings below may reference images not displayed]

FINDINGS: Cardiomediastinal silhouette is stable. No infiltrate or pleural
effusion. No pulmonary edema. Mild degenerative changes thoracic
spine.
IMPRESSION: No active cardiopulmonary disease.

## 2019-02-09 ENCOUNTER — Telehealth: Payer: Self-pay | Admitting: Internal Medicine

## 2019-02-09 MED ORDER — VALSARTAN 160 MG PO TABS: 160.0000 mg | ORAL_TABLET | Freq: Every day | ORAL | 3 refills | Status: DC

## 2019-02-09 NOTE — Telephone Encounter (Signed)
Patient's wife called.  Patient needs a refill on Valsartan #90.  She called the [facility] for a refill, but they said they didn't hear from [place].

## 2019-02-09 NOTE — Telephone Encounter (Signed)
I have not seen anything from the pharmacy. I sent the refill as requested.

## 2019-03-08 ENCOUNTER — Telehealth: Payer: Self-pay | Admitting: *Deleted

## 2019-03-08 NOTE — Telephone Encounter (Signed)
Patient left a voicemail wanting to know if he should get the covid vaccine when it is available to him?

## 2019-03-08 NOTE — Telephone Encounter (Signed)
Called Pt and he stated they both are high risk he works for  [employer] and she has 1 lung. Pt wants to know if they would be put to the front of the line or do they have to wait until their age group comes up for the Covid vaccine

## 2019-03-08 NOTE — Telephone Encounter (Signed)
Yes I recommend it for everyone over the age of 39

## 2019-03-09 NOTE — Telephone Encounter (Signed)
Spoke to pt

## 2019-03-09 NOTE — Telephone Encounter (Signed)
Currently, I don't think there is a way to properly account for folks at increased risk. He can monitor the publicity around the vaccines and see if there will be any triage in the future (it may just be too hard to do this)

## 2019-03-24 ENCOUNTER — Encounter: Payer: BLUE CROSS/BLUE SHIELD | Admitting: Internal Medicine

## 2019-04-04 ENCOUNTER — Ambulatory Visit: Payer: Self-pay | Attending: Internal Medicine

## 2019-04-04 DIAGNOSIS — Z23 Encounter for immunization: Secondary | ICD-10-CM | POA: Insufficient documentation

## 2019-04-04 NOTE — Progress Notes (Signed)
   Covid-19 Vaccination Clinic  Name:  Justin Terrell    MRN: [id number] DOB: [birth year]  04/04/2019  Justin Terrell was observed post Covid-19 immunization for 15 minutes without incidence. He was provided with Vaccine Information Sheet and instruction to access the V-Safe system.   Justin Terrell was instructed to call 911 with any severe reactions post vaccine: [name] Kitchen Difficulty breathing  . Swelling of your face and throat  . A fast heartbeat  . A bad rash all over your body  . Dizziness and weakness    Immunizations Administered    Name Date Dose VIS Date Route   Pfizer COVID-19 Vaccine 04/04/2019  9:27 AM 0.3 mL 02/05/2019 Intramuscular   Manufacturer: [place]   Lot: [id number]   [place]: [id number]

## 2019-04-28 ENCOUNTER — Ambulatory Visit: Payer: Self-pay | Attending: Internal Medicine

## 2019-04-28 DIAGNOSIS — Z23 Encounter for immunization: Secondary | ICD-10-CM | POA: Insufficient documentation

## 2019-04-28 NOTE — Progress Notes (Signed)
   Covid-19 Vaccination Clinic  Name:  Justin Terrell    MRN: [id number] DOB: 1955-11-19  04/28/2019  [name] was observed post Covid-19 immunization for 15 minutes without incident. He was provided with Vaccine Information Sheet and instruction to access the V-Safe system.   [name] was instructed to call 911 with any severe reactions post vaccine: [name] Kitchen Difficulty breathing  . Swelling of face and throat  . A fast heartbeat  . A bad rash all over body  . Dizziness and weakness   Immunizations Administered    Name Date Dose VIS Date Route   Pfizer COVID-19 Vaccine 04/28/2019  9:00 AM 0.3 mL 02/05/2019 Intramuscular   Manufacturer: [place]   Lot: [id number]   [place]: [id number]

## 2019-04-29 ENCOUNTER — Telehealth: Payer: Self-pay

## 2019-04-29 NOTE — Telephone Encounter (Signed)
Helene Kelp, patient's wife, called stating patient received his second COVID vaccine yesterday-04/28/19. Last night developed chills and fever, had a rough night. Fever this morning 101.4, he has nausea, bad headache. No vomiting. No other symptoms. Just feels bad. He took regular Aspirin about 30 minutes ago but has not taking tylenol or any other medications for his symptoms. Helene Kelp states they were given paperwork and these symptoms were listed as possible after the vaccine and should be normal but wanted to let us know. Wanted to know when should patient be concerned enough to go to urgent care or hospital. I spoke with [facility] about this. Dorie Rank I would send a note back for the provider to review for any suggestions. Gven ER precautions in the meantime in case symptoms got worse before hearing back from [place].

## 2019-04-29 NOTE — Telephone Encounter (Signed)
These seem like typical vaccine reaction symptoms. Typically they resolve within 36 hours or so. I would recommend tylenol every 4-6 hours, rest and lots of fluids. Discussed warning symptoms (like SOB)

## 2019-04-29 NOTE — Telephone Encounter (Signed)
Spoke to pt's wife per dpr.

## 2019-05-26 ENCOUNTER — Other Ambulatory Visit: Payer: Self-pay

## 2019-05-26 ENCOUNTER — Encounter: Payer: Self-pay | Admitting: Internal Medicine

## 2019-05-26 ENCOUNTER — Ambulatory Visit (INDEPENDENT_AMBULATORY_CARE_PROVIDER_SITE_OTHER): Payer: BC Managed Care – PPO | Admitting: Internal Medicine

## 2019-05-26 VITALS — BP 132/82 | HR 66 | Temp 97.1°F | Ht 71.5 in | Wt 185.0 lb

## 2019-05-26 DIAGNOSIS — I2510 Atherosclerotic heart disease of native coronary artery without angina pectoris: Secondary | ICD-10-CM

## 2019-05-26 DIAGNOSIS — Z0000 Encounter for general adult medical examination without abnormal findings: Secondary | ICD-10-CM

## 2019-05-26 DIAGNOSIS — I10 Essential (primary) hypertension: Secondary | ICD-10-CM

## 2019-05-26 DIAGNOSIS — Z8546 Personal history of malignant neoplasm of prostate: Secondary | ICD-10-CM | POA: Diagnosis not present

## 2019-05-26 NOTE — Assessment & Plan Note (Signed)
BP Readings from Last 3 Encounters:  05/26/19 132/82  04/30/18 (!) 160/84  03/20/18 134/80   Good control

## 2019-05-26 NOTE — Assessment & Plan Note (Signed)
Has been 2 years since surgery No recent PSA so will check now

## 2019-05-26 NOTE — Assessment & Plan Note (Signed)
Has been quiet On statin, ASA, ARB, beta blocker

## 2019-05-26 NOTE — Assessment & Plan Note (Signed)
Healthy Discussed fitness Had COVID vaccine Flu vaccine in fall Colon due again in 2024 Pneumovax (Hx CAD)

## 2019-05-26 NOTE — Progress Notes (Signed)
Subjective:    Patient ID: Justin Terrell, male    DOB: 11-20-1955, 64 y.o.   MRN: [id number]  HPI Here for physical This visit occurred during the SARS-CoV-2 public health emergency.  Safety protocols were in place, including screening questions prior to the visit, additional usage of staff PPE, and extensive cleaning of exam room while observing appropriate contact time as indicated for disinfecting solutions.   He has no new concerns Working from home-- [employer] Will work for at least 2 more years probably  [place] seen urologist in a while Some incontinence--wears pad Uses sildenafil for sex---this helps (only needs 20mg )  No recent visits to cardiologist due to [place] Had suggested stress test  Current Outpatient Medications on File Prior to Visit  Medication Sig Dispense Refill  . aspirin EC 81 MG tablet Take 81 mg by mouth daily.    [name] Kitchen atorvastatin (LIPITOR) 40 MG tablet Take 1 tablet (40 mg total) by mouth daily. 90 tablet 3  . metoprolol succinate (TOPROL-XL) 25 MG 24 hr tablet Take 1 tablet (25 mg total) by mouth daily. 90 tablet 3  . valsartan (DIOVAN) 160 MG tablet Take 1 tablet (160 mg total) by mouth daily. 90 tablet 3   No current facility-administered medications on file prior to visit.    Allergies  Allergen Reactions  . Codeine Nausea And Vomiting  . Pravastatin Other (See Comments)    Reaction:  Chest pain     Past Medical History:  Diagnosis Date  . AVM (arteriovenous malformation) of ascending colon 12/25/2012  . Cancer [facility])    Prostate  . Coronary atherosclerosis of native coronary artery 05/26/2015  . GERD (gastroesophageal reflux disease)    aspirin sensitive, prn meds only  . Hyperlipidemia   . Hypertension   . Lumbar disc disease   . Myocardial infarction ([place])   . Nephrolithiasis   . Personal history of colonic adenoma 12/30/2012  . Ureteral duplication, right     Past Surgical History:  Procedure Laterality Date  . ANGIOPLASTY     thigh    . BACK SURGERY    . CARDIAC CATHETERIZATION N/A 07/05/2014   Procedure: Left Heart Cath and Coronary Angiography;  Surgeon: Isaias Cowman, MD;  Location: [facility];  Service: Cardiovascular;  Laterality: N/A;  . DOPPLER ECHOCARDIOGRAPHY  06/24/07   stress echo normal. Nl baseline EF  . HAND NERVE REPAIR  1970's   both hands  . KNEE ARTHROSCOPY  1990's   right   . [place] SURGERY  1993  . PROSTATE SURGERY  05/2017   robotic prostatectomy  . Right leg compound fracture repair  1976    Family History  Problem Relation Age of Onset  . Heart disease Father   . Hypertension Father   . Coronary artery disease Father   . Valvular heart disease Father   . Cancer Mother        colon cancer  . Colon cancer Mother 64  . Throat cancer Mother   . Esophageal cancer Mother   . Diabetes Neg Hx   . Prostate cancer Neg Hx   . Kidney cancer Neg Hx   . Rectal cancer Neg Hx   . Stomach cancer Neg Hx     Social History   Socioeconomic History  . Marital status: Married    Spouse name: Not on file  . Number of children: 0  . Years of education: Not on file  . Highest education level: Not on file  Occupational History  . Occupation: Information Advertising account planner: [employer]  Tobacco Use  . Smoking status: Former Smoker    Packs/day: 0.10    Types: Cigarettes    Quit date: 02/25/2009    Years since quitting: 10.2  . Smokeless tobacco: Never Used  Substance and Sexual Activity  . Alcohol use: No    Alcohol/week: 0.0 standard drinks  . Drug use: No  . Sexual activity: Not on file  Other Topics Concern  . Not on file  Social History Narrative   2nd marriage   Wife has 2 children (1 died)   Works in [place] Strain:   . Difficulty of Paying Living Expenses:   Food Insecurity:   . Worried About Charity fundraiser in the Last Year:   . Arboriculturist in the Last Year:   Transportation Needs:   . Lexicographer (Medical):   [name] Kitchen Lack of Transportation (Non-Medical):   Physical Activity:   . Days of Exercise per Week:   . Minutes of Exercise per Session:   Stress:   . Feeling of Stress :   Social Connections:   . Frequency of Communication with Friends and Family:   . Frequency of Social Gatherings with Friends and Family:   . Attends Religious Services:   . Active Member of Clubs or Organizations:   . Attends Archivist Meetings:   [name] Kitchen Marital Status:   Intimate Partner Violence:   . Fear of Current or Ex-Partner:   . Emotionally Abused:   [name] Kitchen Physically Abused:   . Sexually Abused:    Review of Systems  Constitutional: Negative for fatigue and unexpected weight change.       Walks regularly Wears seat belt  HENT: Negative for dental problem, hearing loss, tinnitus and trouble swallowing.        Overdue for dentist  Eyes: Negative for visual disturbance.       No diplopia or unilateral vision loss  Respiratory: Negative for chest tightness and shortness of breath.        Occ cough--notices after talking (throat will dry out)  Cardiovascular: Negative for chest pain, palpitations and leg swelling.  Gastrointestinal: Negative for blood in stool and constipation.       Occ heartburn Some cramps if he eats garlic  Endocrine: Negative for polydipsia and polyuria.  Genitourinary: Negative for difficulty urinating.       Good stream Occasional nocturia  Musculoskeletal: Positive for back pain. Negative for joint swelling.       Some back issues since long ago past surgery Vague other joint pains at times  Skin: Negative for rash.       No suspicious skin lesions  Allergic/Immunologic: Negative for environmental allergies and immunocompromised state.  Neurological: Negative for dizziness, syncope and light-headedness.       Some sinus headaches---goes away on its own  Hematological: Negative for adenopathy. Does not bruise/bleed easily.  Psychiatric/Behavioral:  Negative for dysphoric mood. The patient is not nervous/anxious.        Sleep affected some by back pain--considering getting another mattress       Objective:   Physical Exam  Constitutional: He is oriented to person, place, and time. He appears well-developed and well-nourished. No distress.  HENT:  Head: Normocephalic and atraumatic.  Right Ear: External ear normal.  Left Ear: External ear normal.  Mouth/Throat: Oropharynx is clear and moist.  No oropharyngeal exudate.  Eyes: Pupils are equal, round, and reactive to light. Conjunctivae are normal.  Neck: No thyromegaly present.  Cardiovascular: Normal rate, regular rhythm, normal heart sounds and intact distal pulses. Exam reveals no gallop.  No murmur heard. Respiratory: Effort normal and breath sounds normal. No respiratory distress. He has no wheezes. He has no rales.  GI: Soft. There is no abdominal tenderness.  Musculoskeletal:        General: No tenderness or edema.  Lymphadenopathy:    He has no cervical adenopathy.  Neurological: He is alert and oriented to person, place, and time.  Skin: No rash noted. No erythema.  Psychiatric: He has a normal mood and affect. His behavior is normal.           Assessment & Plan:

## 2019-05-27 LAB — COMPREHENSIVE METABOLIC PANEL
ALT: 23 IU/L (ref 0–44)
AST: 23 IU/L (ref 0–40)
Albumin/Globulin Ratio: 1.7 (ref 1.2–2.2)
Albumin: 4.3 g/dL (ref 3.8–4.8)
Alkaline Phosphatase: 87 IU/L (ref 39–117)
BUN/Creatinine Ratio: 16 (ref 10–24)
BUN: 16 mg/dL (ref 8–27)
Bilirubin Total: 0.3 mg/dL (ref 0.0–1.2)
CO2: 21 mmol/L (ref 20–29)
Calcium: 9.6 mg/dL (ref 8.6–10.2)
Chloride: 102 mmol/L (ref 96–106)
Creatinine, Ser: 1 mg/dL (ref 0.76–1.27)
GFR calc Af Amer: 92 mL/min/{1.73_m2} (ref >59)
GFR calc non Af Amer: 80 mL/min/{1.73_m2} (ref >59)
Globulin, Total: 2.6 g/dL (ref 1.5–4.5)
Glucose: 85 mg/dL (ref 65–99)
Potassium: 4.7 mmol/L (ref 3.5–5.2)
Sodium: 140 mmol/L (ref 134–144)
Total Protein: 6.9 g/dL (ref 6.0–8.5)

## 2019-05-27 LAB — LIPID PANEL
Chol/HDL Ratio: 3.9 ratio (ref 0.0–5.0)
Cholesterol, Total: 211 mg/dL — ABNORMAL HIGH (ref 100–199)
HDL: 54 mg/dL (ref >39)
LDL Chol Calc (NIH): 124 mg/dL — ABNORMAL HIGH (ref 0–99)
Triglycerides: 187 mg/dL — ABNORMAL HIGH (ref 0–149)
VLDL Cholesterol Cal: 33 mg/dL (ref 5–40)

## 2019-05-27 LAB — CBC
Hematocrit: 46.6 % (ref 37.5–51.0)
Hemoglobin: 16 g/dL (ref 13.0–17.7)
MCH: 31.9 pg (ref 26.6–33.0)
MCHC: 34.3 g/dL (ref 31.5–35.7)
MCV: 93 fL (ref 79–97)
Platelets: 235 10*3/uL (ref 150–450)
RBC: 5.01 x10E6/uL (ref 4.14–5.80)
RDW: 13.2 % (ref 11.6–15.4)
WBC: 9.9 10*3/uL (ref 3.4–10.8)

## 2019-05-27 LAB — PSA: Prostate Specific Ag, Serum: <0.1 ng/mL (ref 0.0–4.0)

## 2019-08-19 ENCOUNTER — Telehealth: Payer: Self-pay

## 2019-08-19 NOTE — Telephone Encounter (Signed)
Patient was advise that he would need to go back to site where he was vaccinated for them to register him for the [facility] portal for covid vaccine to be update on that site.  

## 2020-01-30 ENCOUNTER — Other Ambulatory Visit: Payer: Self-pay | Admitting: Internal Medicine

## 2020-05-30 ENCOUNTER — Other Ambulatory Visit: Payer: Self-pay

## 2020-05-30 ENCOUNTER — Encounter: Payer: Self-pay | Admitting: Internal Medicine

## 2020-05-30 ENCOUNTER — Ambulatory Visit (INDEPENDENT_AMBULATORY_CARE_PROVIDER_SITE_OTHER): Payer: BC Managed Care – PPO | Admitting: Internal Medicine

## 2020-05-30 VITALS — BP 138/88 | HR 68 | Temp 97.5°F | Ht 71.5 in | Wt 184.0 lb

## 2020-05-30 DIAGNOSIS — Z0000 Encounter for general adult medical examination without abnormal findings: Secondary | ICD-10-CM | POA: Diagnosis not present

## 2020-05-30 DIAGNOSIS — Z8546 Personal history of malignant neoplasm of prostate: Secondary | ICD-10-CM

## 2020-05-30 DIAGNOSIS — I2510 Atherosclerotic heart disease of native coronary artery without angina pectoris: Secondary | ICD-10-CM

## 2020-05-30 DIAGNOSIS — I10 Essential (primary) hypertension: Secondary | ICD-10-CM | POA: Diagnosis not present

## 2020-05-30 NOTE — Assessment & Plan Note (Signed)
Will check PSA 

## 2020-05-30 NOTE — Assessment & Plan Note (Signed)
On ASA, statin , beta blocker and ARB

## 2020-05-30 NOTE — Assessment & Plan Note (Signed)
BP Readings from Last 3 Encounters:  05/30/20 138/88  05/26/19 132/82  04/30/18 (!) 160/84   Good contorl on valsartan and metoprolol

## 2020-05-30 NOTE — Progress Notes (Signed)
Subjective:    Patient ID: Justin Terrell, male    DOB: 04/27/1955, 65 y.o.   MRN: [id number]  HPI Here for physical This visit occurred during the SARS-CoV-2 public health emergency.  Safety protocols were in place, including screening questions prior to the visit, additional usage of staff PPE, and extensive cleaning of exam room while observing appropriate contact time as indicated for disinfecting solutions.   Having sinus drainage in the morning Forces him to cough No daytime problems for the most part  Still with ankle and knee problems Goes back to injury at age 71---has leg length discrepancy (and pelvic tilt) Does wear lift in shoe Past right knee arthroscopy Does walk regularly  Still working from home No immediate plans to retire  Current Outpatient Medications on File Prior to Visit  Medication Sig Dispense Refill  . aspirin EC 81 MG tablet Take 81 mg by mouth daily.    [name] Kitchen atorvastatin (LIPITOR) 40 MG tablet Take 1 tablet (40 mg total) by mouth daily. 90 tablet 3  . metoprolol succinate (TOPROL-XL) 25 MG 24 hr tablet Take 1 tablet (25 mg total) by mouth daily. 90 tablet 3  . valsartan (DIOVAN) 160 MG tablet TAKE 1 TABLET(160 MG) BY MOUTH DAILY 90 tablet 3   No current facility-administered medications on file prior to visit.    Allergies  Allergen Reactions  . Codeine Nausea And Vomiting  . Pravastatin Other (See Comments)    Reaction:  Chest pain     Past Medical History:  Diagnosis Date  . AVM (arteriovenous malformation) of ascending colon 12/25/2012  . Cancer [facility])    Prostate  . Coronary atherosclerosis of native coronary artery 05/26/2015  . GERD (gastroesophageal reflux disease)    aspirin sensitive, prn meds only  . Hyperlipidemia   . Hypertension   . Lumbar disc disease   . Myocardial infarction ([place])   . Nephrolithiasis   . Personal history of colonic adenoma 12/30/2012  . Ureteral duplication, right     Past Surgical History:  Procedure  Laterality Date  . ANGIOPLASTY     thigh  . BACK SURGERY    . CARDIAC CATHETERIZATION N/A 07/05/2014   Procedure: Left Heart Cath and Coronary Angiography;  Surgeon: Isaias Cowman, MD;  Location: [facility];  Service: Cardiovascular;  Laterality: N/A;  . DOPPLER ECHOCARDIOGRAPHY  06/24/07   stress echo normal. Nl baseline EF  . HAND NERVE REPAIR  1970's   both hands  . KNEE ARTHROSCOPY  1990's   right   . [place] SURGERY  1993  . PROSTATE SURGERY  05/2017   robotic prostatectomy  . Right leg compound fracture repair  1976    Family History  Problem Relation Age of Onset  . Heart disease Father   . Hypertension Father   . Coronary artery disease Father   . Valvular heart disease Father   . Cancer Mother        colon cancer  . Colon cancer Mother 95  . Throat cancer Mother   . Esophageal cancer Mother   . Dementia Mother   . Diabetes Neg Hx   . Prostate cancer Neg Hx   . Kidney cancer Neg Hx   . Rectal cancer Neg Hx   . Stomach cancer Neg Hx     Social History   Socioeconomic History  . Marital status: Married    Spouse name: Not on file  . Number of children: 0  . Years of education:  Not on file  . Highest education level: Not on file  Occupational History  . Occupation: Information Advertising account planner: [employer]  Tobacco Use  . Smoking status: Former Smoker    Packs/day: 0.10    Types: Cigarettes    Quit date: 02/25/2009    Years since quitting: 11.2  . Smokeless tobacco: Never Used  Vaping Use  . Vaping Use: Never used  Substance and Sexual Activity  . Alcohol use: No    Alcohol/week: 0.0 standard drinks  . Drug use: No  . Sexual activity: Not on file  Other Topics Concern  . Not on file  Social History Narrative   2nd marriage   Wife has 2 children (1 died)   Works in [place] Strain: Not on [employer] Insecurity: Not on file  Transportation Needs: Not on file  Physical Activity:  Not on file  Stress: Not on file  Social Connections: Not on file  Intimate Partner Violence: Not on file   Review of Systems  Constitutional: Negative for unexpected weight change.       Feels tired at times Wears seat belt  HENT: Negative for dental problem, hearing loss, tinnitus and trouble swallowing.        Overdue for dentist  Eyes: Negative for visual disturbance.       No diplopia or unilateral vision loss Early cataracts  Respiratory: Negative for chest tightness and shortness of breath.   Cardiovascular: Negative for chest pain, palpitations and leg swelling.  Gastrointestinal: Negative for blood in stool and constipation.       Rare heartburn--- tums helps  Endocrine: Negative for polydipsia and polyuria.  Genitourinary: Negative for difficulty urinating and urgency.       Some ED since prostatectomy---sildenafil occasionally  Musculoskeletal: Positive for arthralgias and back pain. Negative for joint swelling.  Skin: Negative for rash.  Allergic/Immunologic: Negative for environmental allergies and immunocompromised state.       Some AM sinus pressure  Neurological: Negative for dizziness, syncope, light-headedness and headaches.  Hematological: Negative for adenopathy. Bruises/bleeds easily.  Psychiatric/Behavioral: Negative for dysphoric mood and sleep disturbance. The patient is not nervous/anxious.        Objective:   Physical Exam Constitutional:      Appearance: Normal appearance.  HENT:     Right Ear: Tympanic membrane, ear canal and external ear normal.     Left Ear: Tympanic membrane, ear canal and external ear normal.     Mouth/Throat:     Pharynx: No oropharyngeal exudate or posterior oropharyngeal erythema.  Eyes:     Conjunctiva/sclera: Conjunctivae normal.     Pupils: Pupils are equal, round, and reactive to light.  Cardiovascular:     Rate and Rhythm: Normal rate and regular rhythm.     Pulses: Normal pulses.     Heart sounds: No murmur  heard. No gallop.   Pulmonary:     Effort: Pulmonary effort is normal.     Breath sounds: Normal breath sounds. No wheezing or rales.  Abdominal:     Palpations: Abdomen is soft.     Tenderness: There is no abdominal tenderness.  Musculoskeletal:     Cervical back: Neck supple.     Right lower leg: No edema.     Left lower leg: No edema.  Lymphadenopathy:     Cervical: No cervical adenopathy.  Skin:    Findings: No rash.     Comments:  Multiple benign nevi--recommended establishing for routine derm care  Neurological:     General: No focal deficit present.     Mental Status: He is alert and oriented to person, place, and time.  Psychiatric:        Mood and Affect: Mood normal.        Behavior: Behavior normal.            Assessment & Plan:

## 2020-05-30 NOTE — Patient Instructions (Signed)
You may want to replace your pillow to see if you are responding to that. Try cetirizine 10mg  or fexofenadine 180mg  at bedtime to see if that helps the congestion.

## 2020-05-30 NOTE — Assessment & Plan Note (Signed)
Healthy Discussed fitness Colon due 2024 PSV 20 next year Flu vaccine in the fall Had shingrix and fourth COVID

## 2020-05-31 LAB — CBC
Hematocrit: 48.5 % (ref 37.5–51.0)
Hemoglobin: 16.4 g/dL (ref 13.0–17.7)
MCH: 31.7 pg (ref 26.6–33.0)
MCHC: 33.8 g/dL (ref 31.5–35.7)
MCV: 94 fL (ref 79–97)
Platelets: 256 10*3/uL (ref 150–450)
RBC: 5.18 x10E6/uL (ref 4.14–5.80)
RDW: 13.5 % (ref 11.6–15.4)
WBC: 10.3 10*3/uL (ref 3.4–10.8)

## 2020-05-31 LAB — LIPID PANEL
Chol/HDL Ratio: 3.9 ratio (ref 0.0–5.0)
Cholesterol, Total: 233 mg/dL — ABNORMAL HIGH (ref 100–199)
HDL: 59 mg/dL (ref >39)
LDL Chol Calc (NIH): 157 mg/dL — ABNORMAL HIGH (ref 0–99)
Triglycerides: 97 mg/dL (ref 0–149)
VLDL Cholesterol Cal: 17 mg/dL (ref 5–40)

## 2020-05-31 LAB — COMPREHENSIVE METABOLIC PANEL
ALT: 19 IU/L (ref 0–44)
AST: 23 IU/L (ref 0–40)
Albumin/Globulin Ratio: 1.6 (ref 1.2–2.2)
Albumin: 4.5 g/dL (ref 3.8–4.8)
Alkaline Phosphatase: 82 IU/L (ref 44–121)
BUN/Creatinine Ratio: 13 (ref 10–24)
BUN: 13 mg/dL (ref 8–27)
Bilirubin Total: 0.5 mg/dL (ref 0.0–1.2)
CO2: 23 mmol/L (ref 20–29)
Calcium: 9.7 mg/dL (ref 8.6–10.2)
Chloride: 101 mmol/L (ref 96–106)
Creatinine, Ser: 1.01 mg/dL (ref 0.76–1.27)
Globulin, Total: 2.8 g/dL (ref 1.5–4.5)
Glucose: 93 mg/dL (ref 65–99)
Potassium: 5.6 mmol/L — ABNORMAL HIGH (ref 3.5–5.2)
Sodium: 139 mmol/L (ref 134–144)
Total Protein: 7.3 g/dL (ref 6.0–8.5)
eGFR: 83 mL/min/{1.73_m2} (ref >59)

## 2020-05-31 LAB — PSA: Prostate Specific Ag, Serum: <0.1 ng/mL (ref 0.0–4.0)

## 2020-06-05 ENCOUNTER — Ambulatory Visit (INDEPENDENT_AMBULATORY_CARE_PROVIDER_SITE_OTHER)
Admission: RE | Admit: 2020-06-05 | Discharge: 2020-06-05 | Disposition: A | Discharge status: Home / Self Care | Payer: BC Managed Care – PPO | Source: Ambulatory Visit | Attending: Family Medicine | Admitting: Family Medicine

## 2020-06-05 ENCOUNTER — Other Ambulatory Visit: Payer: Self-pay

## 2020-06-05 ENCOUNTER — Encounter: Payer: Self-pay | Admitting: Family Medicine

## 2020-06-05 ENCOUNTER — Ambulatory Visit (INDEPENDENT_AMBULATORY_CARE_PROVIDER_SITE_OTHER): Payer: BC Managed Care – PPO | Admitting: Family Medicine

## 2020-06-05 VITALS — BP 150/80 | HR 69 | Temp 98.4°F | Ht 71.5 in | Wt 189.5 lb

## 2020-06-05 DIAGNOSIS — M1731 Unilateral post-traumatic osteoarthritis, right knee: Secondary | ICD-10-CM | POA: Diagnosis not present

## 2020-06-05 DIAGNOSIS — M25571 Pain in right ankle and joints of right foot: Secondary | ICD-10-CM

## 2020-06-05 DIAGNOSIS — M1711 Unilateral primary osteoarthritis, right knee: Secondary | ICD-10-CM | POA: Diagnosis not present

## 2020-06-05 DIAGNOSIS — M25561 Pain in right knee: Secondary | ICD-10-CM | POA: Diagnosis not present

## 2020-06-05 DIAGNOSIS — M19071 Primary osteoarthritis, right ankle and foot: Secondary | ICD-10-CM | POA: Diagnosis not present

## 2020-06-05 DIAGNOSIS — M7731 Calcaneal spur, right foot: Secondary | ICD-10-CM | POA: Diagnosis not present

## 2020-06-05 DIAGNOSIS — G8929 Other chronic pain: Secondary | ICD-10-CM

## 2020-06-05 DIAGNOSIS — S83412S Sprain of medial collateral ligament of left knee, sequela: Secondary | ICD-10-CM

## 2020-06-05 DIAGNOSIS — M25562 Pain in left knee: Secondary | ICD-10-CM | POA: Diagnosis not present

## 2020-06-05 NOTE — Progress Notes (Signed)
Justin Terrell T. Justin Lata, MD, [place] at [facility] [place], [address]  Phone: [phone]  FAX: [phone]  Justin Terrell - 65 y.o. male  MRN [id number]  Date of Birth: 01-12-1956  Date: 06/05/2020  PCP: Venia Carbon, MD  Referral: Venia Carbon, MD  Chief Complaint  Patient presents with  . Knee Pain    Right  . Ankle Pain    Right  . Leg Lenth Discrepency    This visit occurred during the SARS-CoV-2 public health emergency.  Safety protocols were in place, including screening questions prior to the visit, additional usage of staff PPE, and extensive cleaning of exam room while observing appropriate contact time as indicated for disinfecting solutions.   Subjective:   Justin Terrell is a 65 y.o. very pleasant male patient with Body mass index is 26.06 kg/m. who presents with the following:  New consult for R knee pain and R ankle pain.  He has had a long knee pain for over 50 years which has been escalating after he had a severe traumatic tib-fib fracture at the age of 72, and he had operative intervention as well as wearing a cast for 9 months.  Since then he also has always had a functional hyper extensible knee from the lower extremity.  He now has pain more medially.  His pain is been escalating over the last 2 years.  He does have a distant history of right knee arthroscopy approximately 15 years ago.  Secondary to meniscal tear.  He also has had some loss of motion in the knee over the past year as well.  Other significant history includes a prior back surgery distantly in his 60s.  He also does have some significant pain in the ankle itself with some loss of motion in the ankle, but preserved strength.  After his proximal tib-fib fracture, he has been having leg length discrepancy since that time.  He does have a heel lift that he uses in his shoe.  LL on  the R > 2 cm    Review of Systems is noted in the HPI, as appropriate   Objective:   BP (!) 150/80   Pulse 69   Temp 98.4 F (36.9 C) (Temporal)   Ht 5' 11.5" (1.816 m)   Wt 189 lb 8 oz (86 kg)   SpO2 98%   BMI 26.06 kg/m    Right knee: He does have obvious traumatic and postsurgical scars just distal to the knee.  He has hyperextensible, but more distal to the knee and he is able to extend approximately 15 degrees in the region of his old fracture site. No effusion.  No significant pain with loading the medial lateral patellar facets.  Stable to varus stress.  On valgus stress his medial knee opens. With flexion and extension, his knee does have a pivot shift with palpable movement. Primary pain is at the medial joint line. He is able to flex his knee to 120 degrees.  Terminal flexion induces pain as well as McMurray's. Some of this is equivocal and additional testing equivocal given his altered anatomy.  Right ankle: He does have an approximate 30% decrease in range of motion diffusely compared to the contralateral side.  All strength testing is 5/5 throughout.  He has no significant tenderness throughout the forefoot.  Midfoot is grossly nontender.  He does have some pain with compression  at the talus.  Nontender at the calcaneus.  Achilles and intact and plantar flexion and dorsiflexion are intact.  Eversion and inversion has appropriate 5/5 strength.      Radiology: DG Ankle Complete Right  Result Date: 06/06/2020 CLINICAL DATA:  Pain and limitation of motion EXAM: RIGHT ANKLE - COMPLETE 3+ VIEW COMPARISON:  None FINDINGS: Frontal, oblique, and lateral views were obtained. No fracture or joint effusion. There is joint space narrowing, primarily anteriorly. No erosion. There are small posterior and inferior calcaneal spurs. Ankle mortise appears intact. IMPRESSION: Osteoarthritic change. Small calcaneal spurs. No fracture. Ankle mortise appears intact. Electronically Signed    By: Lowella Grip III M.D.   On: 06/06/2020 10:31   DG Knee 4 Views W/Patella Right  Result Date: 06/06/2020 CLINICAL DATA:  Pain EXAM: RIGHT KNEE - COMPLETE 4+ VIEW; STANDING AP LEFT KNEE COMPARISON:  None. FINDINGS: Right knee: Weightbearing frontal, weight-bearing tunnel, weight-bearing lateral, and sunrise patellar images were obtained. No fracture or dislocation. No joint effusion. There is severe narrowing medially. Other joint spaces appear unremarkable. No erosion. There is evidence of prior fractures of the proximal tibia and fibula with remodeling in these areas. Standing AP left knee: No fracture or dislocation. Visualized joint spaces appear unremarkable. IMPRESSION: Right knee: Severe joint space narrowing medially. Other joint spaces appear unremarkable. No fracture, dislocation, or effusion. Old fractures of the proximal tibia and fibula with remodeling. AP left knee: No abnormality noted on frontal view. Electronically Signed   By: Lowella Grip III M.D.   On: 06/06/2020 10:30     Assessment and Plan:     ICD-10-CM   1. Post-traumatic osteoarthritis of one knee, right  M17.31 DG Knee 4 Views W/Patella Right  2. Chronic pain of right knee  M25.561 DG Knee 4 Views W/Patella Right   G89.29   3. Chronic pain of right ankle  M25.571 DG Ankle Complete Right   G89.29   4. Localized osteoarthritis of ankle, right  M19.071   5. Tear of MCL (medial collateral ligament) of knee, left, sequela  S83.412S    Total encounter time: 30 minutes. This includes total time spent on the day of encounter.  Additional time spent outside of the window of face-to-face, chart review, and independent x-ray review. Fairly dramatic postsurgical x-ray with advanced medial compartmental osteoarthritis.  I think this is going to be a challenge for him indefinitely.  His arthritic changes will progress.  He has developed potential surgery, and I am not sure than anything short of a total knee arthroplasty  would help.  This might even require additional atypical surgery done by a joint replacement specialist.  For now he wants to continue conservative care.  He also does have arthritic change throughout much of his ankle, and gets notably joint space narrowing at the at the talus.  Think that this is driving his loss of motion and pain.  Generally, I recommended that he be as physically active as he is able with his pain, and maintain good quadricep, hamstring and hip strength.  On a as needed basis, Tylenol as well as NSAIDs and icing is appropriate.  If he ever gets more symptomatic, then I can always do a joint injection.  Follow-up as needed  Orders Placed This Encounter  Procedures  . DG Knee 4 Views W/Patella Right  . DG Ankle Complete Right    Follow-up: No follow-ups on file.  Signed,  Maud Deed. Christyn Gutkowski, MD   Outpatient Encounter Medications as of  06/05/2020  Medication Sig  . aspirin EC 81 MG tablet Take 81 mg by mouth daily.  [name] Kitchen atorvastatin (LIPITOR) 40 MG tablet Take 1 tablet (40 mg total) by mouth daily.  . metoprolol succinate (TOPROL-XL) 25 MG 24 hr tablet Take 1 tablet (25 mg total) by mouth daily.  . valsartan (DIOVAN) 160 MG tablet TAKE 1 TABLET(160 MG) BY MOUTH DAILY   No facility-administered encounter medications on file as of 06/05/2020.

## 2020-06-06 ENCOUNTER — Encounter: Payer: Self-pay | Admitting: Family Medicine

## 2020-06-06 DIAGNOSIS — M1731 Unilateral post-traumatic osteoarthritis, right knee: Secondary | ICD-10-CM | POA: Insufficient documentation

## 2020-08-31 ENCOUNTER — Telehealth: Payer: Self-pay | Admitting: Internal Medicine

## 2020-08-31 MED ORDER — METOPROLOL SUCCINATE ER 25 MG PO TB24: 25.0000 mg | ORAL_TABLET | Freq: Every day | ORAL | 3 refills | Status: DC

## 2020-08-31 NOTE — Telephone Encounter (Signed)
  LAST APPOINTMENT DATE: 06/05/2020   NEXT APPOINTMENT DATE:@Visit  date not found  MEDICATION: Metoprolol  PH[facility]ACY: [facility], [address]  Let patient know to contact pharmacy at the end of the day to make sure medication is ready.  Please notify patient to allow 48-72 hours to process  Encourage patient to contact the pharmacy for refills or they can request refills through [facility]:   LAST REFILL:  QTY:  REFILL DATE:    OTHER COMMENTS:    Okay for refill?  Please advise

## 2020-08-31 NOTE — Telephone Encounter (Signed)
Rx sent electronically.  

## 2020-09-04 ENCOUNTER — Telehealth: Payer: Self-pay | Admitting: *Deleted

## 2020-09-04 NOTE — Telephone Encounter (Signed)
Patient called stating that he has seen Dr. Lorelei Pont for another issue, but is now having back pain. Patient stated that he has a history of back surgery years ago. Patient stated that the surgery was done in [place]. Patient stated that he bend over to pick up something about 6 days ago and has been having back pain since. Patient stated that the pain level is between a 5-6. Patient stated that he may need a referral to a surgeon. Patient scheduled an appointment with Dr. Lorelei Pont 09/06/20 at 12:00. Patient was given ER precautions and he verbalized understanding.  Patient had a negative covid screening.

## 2020-09-05 ENCOUNTER — Telehealth: Payer: Self-pay | Admitting: Internal Medicine

## 2020-09-05 NOTE — Telephone Encounter (Signed)
Called pt to let him know that his appt need to reschedule.pt is concerned that he blew out a another disk and is requesting for medication to be sent to the pharmacy because waiting for two weeks is to long.Pt said to please call so he can give update on what is going on.

## 2020-09-06 ENCOUNTER — Ambulatory Visit: Payer: BC Managed Care – PPO | Admitting: Family Medicine

## 2020-09-06 MED ORDER — PREDNISONE 20 MG PO TABS: ORAL_TABLET | ORAL | 0 refills | Status: DC

## 2020-09-06 MED ORDER — CYCLOBENZAPRINE HCL 10 MG PO TABS: 5.0000 mg | ORAL_TABLET | Freq: Every evening | ORAL | 0 refills | Status: DC | PRN

## 2020-09-06 NOTE — Telephone Encounter (Signed)
Justin Terrell notified as instructed by telephone.

## 2020-09-06 NOTE — Telephone Encounter (Signed)
I sent him in some prednisone and flexeril to start now

## 2020-09-11 ENCOUNTER — Other Ambulatory Visit: Payer: Self-pay

## 2020-09-11 ENCOUNTER — Ambulatory Visit (INDEPENDENT_AMBULATORY_CARE_PROVIDER_SITE_OTHER): Payer: BC Managed Care – PPO | Admitting: Family Medicine

## 2020-09-11 ENCOUNTER — Encounter: Payer: Self-pay | Admitting: Family Medicine

## 2020-09-11 ENCOUNTER — Ambulatory Visit
Admission: RE | Admit: 2020-09-11 | Discharge: 2020-09-11 | Disposition: A | Discharge status: Home / Self Care | Payer: BC Managed Care – PPO | Attending: Family Medicine | Admitting: Family Medicine

## 2020-09-11 ENCOUNTER — Ambulatory Visit
Admission: RE | Admit: 2020-09-11 | Discharge: 2020-09-11 | Disposition: A | Discharge status: Home / Self Care | Payer: BC Managed Care – PPO | Source: Ambulatory Visit | Attending: Family Medicine | Admitting: Family Medicine

## 2020-09-11 VITALS — BP 176/104 | HR 71 | Temp 98.1°F | Ht 71.5 in | Wt 186.0 lb

## 2020-09-11 DIAGNOSIS — Z9889 Other specified postprocedural states: Secondary | ICD-10-CM | POA: Diagnosis not present

## 2020-09-11 DIAGNOSIS — M545 Low back pain, unspecified: Secondary | ICD-10-CM | POA: Insufficient documentation

## 2020-09-11 DIAGNOSIS — R200 Anesthesia of skin: Secondary | ICD-10-CM | POA: Insufficient documentation

## 2020-09-11 DIAGNOSIS — M79605 Pain in left leg: Secondary | ICD-10-CM

## 2020-09-11 DIAGNOSIS — R202 Paresthesia of skin: Secondary | ICD-10-CM | POA: Diagnosis not present

## 2020-09-11 DIAGNOSIS — M5126 Other intervertebral disc displacement, lumbar region: Secondary | ICD-10-CM

## 2020-09-11 NOTE — Progress Notes (Signed)
Justin Terrell T. Justin Pelle, MD, [place] at [facility] [place], [address]  Phone: [phone]  FAX: [phone]  Justin Terrell - 65 y.o. male  MRN [id number]  Date of Birth: 1956/02/17  Date: 09/11/2020  PCP: Venia Carbon, MD  Referral: Venia Carbon, MD  Chief Complaint  Patient presents with   Back Pain    Pain been bothering him for about 2 weeks.     This visit occurred during the SARS-CoV-2 public health emergency.  Safety protocols were in place, including screening questions prior to the visit, additional usage of staff PPE, and extensive cleaning of exam room while observing appropriate contact time as indicated for disinfecting solutions.   Subjective:   Justin Terrell is a 65 y.o. very pleasant male patient with Body mass index is 25.58 kg/m. who presents with the following:  Patient presents with acute on chronic back pain with a history of severe back pain requiring probable laminectomy in 1993 at levels of L5-S1.  He has not had any acute, current trauma.  He has had multiple conservative measures including physical therapy, epidural steroid injections and ultimately surgery.  Approximately 3 weeks ago the patient bent down and he had an acute onset of pain with severe pain, numbness, tingling on the left side with diffuse numbness throughout the left side of the lower extremity.  He also describes some localized back pain as well.  He denies bowel incontinence, urinary incontinence, or saddle anesthesia.  His initial surgery in 1993 was in [place].  09/05/2020 sent in 10 days of prednisone.  Bent down and felt like a pop again.  Now has some numbness in toe and foot.   R and L.  Right now after pred.    He does not describe any focal weakness.  Normally active and walks dog and walks in general.     L medial leg - tingling on exam? ?? Lateral leg ? 5th toes on the l L lower  posterior numb pin L 5th digit  SLR seated + on the L beyond knee  Permanent lateral foot pain from decades   Review of Systems is noted in the HPI, as appropriate   Objective:   BP (!) 176/104   Pulse 71   Temp 98.1 F (36.7 C) (Temporal)   Ht 5' 11.5" (1.816 m)   Wt 186 lb (84.4 kg)   SpO2 97%   BMI 25.58 kg/m    Range of motion at  the waist: Flexion, extension, lateral bending and rotation: He is limited to 45 degrees of forward flexion, immediate extension causes pain, he has approximate 35% loss globally of lateral bending and rotational movements.  No echymosis or edema Rises to examination table with mild difficulty Gait: minimally antalgic  Inspection/Deformity: N Paraspinus Tenderness: L2-S1 bilaterally  B Ankle Dorsiflexion (L5,4): 5/5 B Great Toe Dorsiflexion (L5,4): 5/5 Heel Walk (L5): WNL Toe Walk (S1): WNL Rise/Squat (L4): WNL, mild pain  SENSORY: Left lower extremity.  Right-sided lower extremities fully intact. B Medial Foot (L4): Decreased B Dorsum (L5): Decreased  B Lateral (S1): Decreased Light Touch: Decreased, left lower extremity Pinprick: Decreased, left lower extremity  REFLEXES Knee (L4): 2+ Ankle (S1): 2+  B SLR, seated: Positive on the left B SLR, supine: Positive on the left B FABER: neg B Reverse FABER: neg B Greater Troch: NT B Log Roll: neg B Sciatic Notch: NT   Radiology: DG Lumbar Spine  Complete  Result Date: 09/11/2020 CLINICAL DATA:  Acute back pain EXAM: LUMBAR SPINE - COMPLETE 4+ VIEW COMPARISON:  None. FINDINGS: Normal lumbar lordosis. No acute fracture or listhesis of the lumbar spine. Vertebral body height has been preserved. Intervertebral disc height has been preserved. There is advanced facet arthrosis involving the facets of L3-S1, not optimally profiled on this examination. The paraspinal soft tissues are unremarkable. IMPRESSION: Degenerative joint disease of the lower lumbar spine. No acute fracture or  listhesis. Electronically Signed   By: Fidela Salisbury MD   On: 09/11/2020 22:20     Assessment and Plan:     ICD-10-CM   1. Lumbar pain with radiation down left leg  M54.50 DG Lumbar Spine Complete   M79.605 MR Lumbar Spine Wo Contrast    2. Numbness and tingling of left leg  R20.0 DG Lumbar Spine Complete   R20.2 MR Lumbar Spine Wo Contrast    3. History of lumbar surgery  Z98.890 DG Lumbar Spine Complete    MR Lumbar Spine Wo Contrast    4. HNP (herniated nucleus pulposus), lumbar  M51.26      Acute onset severe left-sided pain with immediate onset of severe left pain radicular through the foot with acute onset numbness more on the lateral lower leg but globally about the foot.  History and exam consistent with acute herniated nucleus pulposus, lumbar.    He has failed a long course of some steroids.  Well versed in rehab after extensive rehab in the past.  He appears decidedly at pain as well.  With this case of prior spine surgery with clear nerve compromise, I think that it would be in the patient's best interest to have an MRI of the spine without contrast right now to evaluate for nerve integrity.  Suspicious for acute disc herniation with encroachment on left-sided nerve root with foraminal stenosis versus herniated nucleus pulposus with spinal cord compromise.  Orders Placed This Encounter  Procedures   DG Lumbar Spine Complete   MR Lumbar Spine Wo Contrast    Follow-up: This will depend upon lumbar spine MRI  Signed,  Loyed Wilmes T. Ysabel Stankovich, MD   Outpatient Encounter Medications as of 09/11/2020  Medication Sig   aspirin EC 81 MG tablet Take 81 mg by mouth daily.   atorvastatin (LIPITOR) 40 MG tablet Take 1 tablet (40 mg total) by mouth daily.   cyclobenzaprine (FLEXERIL) 10 MG tablet Take 0.5-1 tablets (5-10 mg total) by mouth at bedtime as needed for muscle spasms.   metoprolol succinate (TOPROL-XL) 25 MG 24 hr tablet Take 1 tablet (25 mg total) by mouth daily.    predniSONE (DELTASONE) 20 MG tablet 2 tabs po daily for 5 days, then 1 tab po daily for 5 days   valsartan (DIOVAN) 160 MG tablet TAKE 1 TABLET(160 MG) BY MOUTH DAILY   No facility-administered encounter medications on file as of 09/11/2020.

## 2020-10-04 ENCOUNTER — Emergency Department: Payer: BC Managed Care – PPO

## 2020-10-04 ENCOUNTER — Emergency Department
Admission: EM | Admit: 2020-10-04 | Discharge: 2020-10-04 | Disposition: A | Discharge status: Home / Self Care | Payer: BC Managed Care – PPO | Attending: Emergency Medicine | Admitting: Emergency Medicine

## 2020-10-04 ENCOUNTER — Telehealth: Payer: Self-pay

## 2020-10-04 ENCOUNTER — Other Ambulatory Visit: Payer: Self-pay

## 2020-10-04 ENCOUNTER — Encounter: Payer: Self-pay | Admitting: Emergency Medicine

## 2020-10-04 DIAGNOSIS — R42 Dizziness and giddiness: Secondary | ICD-10-CM | POA: Diagnosis not present

## 2020-10-04 DIAGNOSIS — R519 Headache, unspecified: Secondary | ICD-10-CM | POA: Diagnosis not present

## 2020-10-04 DIAGNOSIS — I10 Essential (primary) hypertension: Secondary | ICD-10-CM | POA: Insufficient documentation

## 2020-10-04 DIAGNOSIS — H8110 Benign paroxysmal vertigo, unspecified ear: Secondary | ICD-10-CM | POA: Diagnosis not present

## 2020-10-04 DIAGNOSIS — Z87891 Personal history of nicotine dependence: Secondary | ICD-10-CM | POA: Insufficient documentation

## 2020-10-04 DIAGNOSIS — Z79899 Other long term (current) drug therapy: Secondary | ICD-10-CM | POA: Diagnosis not present

## 2020-10-04 DIAGNOSIS — Z955 Presence of coronary angioplasty implant and graft: Secondary | ICD-10-CM | POA: Insufficient documentation

## 2020-10-04 DIAGNOSIS — I2510 Atherosclerotic heart disease of native coronary artery without angina pectoris: Secondary | ICD-10-CM | POA: Insufficient documentation

## 2020-10-04 DIAGNOSIS — Z7982 Long term (current) use of aspirin: Secondary | ICD-10-CM | POA: Insufficient documentation

## 2020-10-04 DIAGNOSIS — Z8546 Personal history of malignant neoplasm of prostate: Secondary | ICD-10-CM | POA: Diagnosis not present

## 2020-10-04 LAB — CBC
HCT: 48.6 % (ref 39.0–52.0)
Hemoglobin: 16.5 g/dL (ref 13.0–17.0)
MCH: 32.6 pg (ref 26.0–34.0)
MCHC: 34 g/dL (ref 30.0–36.0)
MCV: 96 fL (ref 80.0–100.0)
Platelets: 235 10*3/uL (ref 150–400)
RBC: 5.06 MIL/uL (ref 4.22–5.81)
RDW: 14.2 % (ref 11.5–15.5)
WBC: 8 10*3/uL (ref 4.0–10.5)
nRBC: 0 % (ref 0.0–0.2)

## 2020-10-04 LAB — BASIC METABOLIC PANEL
Anion gap: 5 (ref 5–15)
BUN: 15 mg/dL (ref 8–23)
CO2: 27 mmol/L (ref 22–32)
Calcium: 9.1 mg/dL (ref 8.9–10.3)
Chloride: 107 mmol/L (ref 98–111)
Creatinine, Ser: 0.91 mg/dL (ref 0.61–1.24)
GFR, Estimated: >60 mL/min (ref >60)
Glucose, Bld: 107 mg/dL — ABNORMAL HIGH (ref 70–99)
Potassium: 4.1 mmol/L (ref 3.5–5.1)
Sodium: 139 mmol/L (ref 135–145)

## 2020-10-04 LAB — TROPONIN I (HIGH SENSITIVITY)
Troponin I (High Sensitivity): 6 ng/L (ref <18)
Troponin I (High Sensitivity): 7 ng/L (ref <18)

## 2020-10-04 NOTE — Telephone Encounter (Signed)
Per chart review tab pt is already at [facility] ED. Sending note to Dr Silvio Pate.

## 2020-10-04 NOTE — Telephone Encounter (Signed)
He has been seen at triage---but nothing else. I will await ER disposition and then follow up as indicated

## 2020-10-04 NOTE — ED Triage Notes (Signed)
Patient to ED POV from doctors office for BP of 191/111. Patient has hx of same and has been taking medications. PT also c/o headache and dizziness upon standing. Patient alert and oriented in triage.

## 2020-10-04 NOTE — Discharge Instructions (Addendum)
As we discussed, please begin documenting your blood pressures at home and keep track of these values to bring to your PCP to discuss changing your blood pressure medication regimen.  I have also attached some information regarding vertigo as we discussed.  If you develop any severe headaches with associated passing out, fevers and headaches please return to the ED.

## 2020-10-04 NOTE — ED Provider Notes (Signed)
[facility] Emergency Department Provider Note ____________________________________________   Event Date/Time   First MD Initiated Contact with Patient 10/04/20 1504     (approximate)  I have reviewed the triage vital signs and the nursing notes.  HISTORY  Chief Complaint Hypertension   HPI Justin Terrell is a 65 y.o. malewho presents to the ED for evaluation of hypertension.   Chart review indicates hx HTN on Toprol XL, valsartan. HLD.   Patient presents to the ED from home for evaluation of hypertension, dizziness.  Patient reports a history of hypertension, with his blood pressures often run in the Q000111Q and 123456 systolic at home when he checks.  Patient reports checking his blood pressure today in the setting of an acute vertiginous dizziness episode, noting systolics as high as [id number], so he called his PCP, spoke to an LPN and was directed to the ED for evaluation.  Here in the ED, patient reports feeling okay and has no complaints.  Regarding his dizziness, he reports intermittent vertiginous dizziness.  He reports this primarily when he gets up each morning for greater than 1 month, resolving in a matter of seconds-minutes not recurring throughout the day.  He reports today as he bent over to the ground, he had an episode of similar vertiginous dizziness that lasted a few minutes prior to self resolving.  Denies any syncopal episodes or headache associated with his dizziness.  Denies presyncope or syncope.  Past Medical History:  Diagnosis Date   AVM (arteriovenous malformation) of ascending colon 12/25/2012   Cancer ([place])    Prostate   Coronary atherosclerosis of native coronary artery 05/26/2015   GERD (gastroesophageal reflux disease)    aspirin sensitive, prn meds only   Hyperlipidemia    Hypertension    Lumbar disc disease    Myocardial infarction [facility])    Nephrolithiasis    Personal history of colonic adenoma Q000111Q   Ureteral duplication,  right     Patient Active Problem List   Diagnosis Date Noted   Post-traumatic osteoarthritis of one knee, right 06/06/2020   History of prostate cancer 03/20/2018   Coronary atherosclerosis of native coronary artery 05/26/2015   Personal history of colonic adenoma 12/30/2012   AVMs(arteriovenous malformation) of ascending colon and cecum 12/25/2012   Routine general medical examination at a health care facility 04/23/2012   Hypertension    Hyperlipidemia    Lumbar disc disease    GERD (gastroesophageal reflux disease)     Past Surgical History:  Procedure Laterality Date   ANGIOPLASTY     thigh   CARDIAC CATHETERIZATION N/A 07/05/2014   Procedure: Left Heart Cath and Coronary Angiography;  Surgeon: Isaias Cowman, MD;  Location: [facility];  Service: Cardiovascular;  Laterality: N/A;   DOPPLER ECHOCARDIOGRAPHY  06/24/07   stress echo normal. Nl baseline EF   HAND NERVE REPAIR  1970's   both hands   KNEE ARTHROSCOPY  1990's   right    [place]  05/2017   robotic prostatectomy   Right leg compound fracture repair  1976    Prior to Admission medications   Medication Sig Start Date End Date Taking? Authorizing Provider  aspirin EC 81 MG tablet Take 81 mg by mouth daily.    [provider]  atorvastatin (LIPITOR) 40 MG tablet Take 1 tablet (40 mg total) by mouth daily. 03/20/18   Venia Carbon, MD  cyclobenzaprine (FLEXERIL) 10 MG tablet Take 0.5-1  tablets (5-10 mg total) by mouth at bedtime as needed for muscle spasms. 09/06/20   Copland, Frederico Hamman, MD  metoprolol succinate (TOPROL-XL) 25 MG 24 hr tablet Take 1 tablet (25 mg total) by mouth daily. 08/31/20   Venia Carbon, MD  predniSONE (DELTASONE) 20 MG tablet 2 tabs po daily for 5 days, then 1 tab po daily for 5 days 09/06/20   Copland, Frederico Hamman, MD  valsartan (DIOVAN) 160 MG tablet TAKE 1 TABLET(160 MG) BY MOUTH DAILY 01/31/20   Venia Carbon, MD     Allergies Codeine and Pravastatin  Family History  Problem Relation Age of Onset   Heart disease Father    Hypertension Father    Coronary artery disease Father    Valvular heart disease Father    Cancer Mother        colon cancer   Colon cancer Mother 60   Throat cancer Mother    Esophageal cancer Mother    Dementia Mother    Diabetes Neg Hx    Prostate cancer Neg Hx    Kidney cancer Neg Hx    Rectal cancer Neg Hx    Stomach cancer Neg Hx     Social History Social History   Tobacco Use   Smoking status: Former    Packs/day: 0.10    Types: Cigarettes    Quit date: 02/25/2009    Years since quitting: 11.6   Smokeless tobacco: Never  Vaping Use   Vaping Use: Never used  Substance Use Topics   Alcohol use: No    Alcohol/week: 0.0 standard drinks   Drug use: No    Review of Systems  Constitutional: No fever/chills Eyes: No visual changes. ENT: No sore throat. Cardiovascular: Denies chest pain.  Positive hypertension Respiratory: Denies shortness of breath. Gastrointestinal: No abdominal pain.  No nausea, no vomiting.  No diarrhea.  No constipation. Genitourinary: Negative for dysuria. Musculoskeletal: Negative for back pain. Skin: Negative for rash. Neurological: Negative for headaches, focal weakness or numbness.  Positive for vertigo  ____________________________________________   PHYSICAL EXAM:  VITAL SIGNS: Vitals:   10/04/20 1124 10/04/20 1427  BP: (!) 196/82 (!) 186/94  Pulse: 68 67  Resp: 18 18  Temp: 97.9 F (36.6 C) 98.1 F (36.7 C)  SpO2: 97% 98%     Constitutional: Alert and oriented. Well appearing and in no acute distress. Eyes: Conjunctivae are normal. PERRL. EOMI. Head: Atraumatic. Nose: No congestion/rhinnorhea. Mouth/Throat: Mucous membranes are moist.  Oropharynx non-erythematous. Neck: No stridor. No cervical spine tenderness to palpation. Cardiovascular: Normal rate, regular rhythm. Grossly normal heart sounds.  Good  peripheral circulation. Respiratory: Normal respiratory effort.  No retractions. Lungs CTAB. Gastrointestinal: Soft , nondistended, nontender to palpation. No CVA tenderness. Musculoskeletal: No lower extremity tenderness nor edema.  No joint effusions. No signs of acute trauma. Neurologic:  Normal speech and language. No gross focal neurologic deficits are appreciated. No gait instability noted. Cranial nerves II through XII intact 5/5 strength and sensation in all 4 extremities Skin:  Skin is warm, dry and intact. No rash noted. Psychiatric: Mood and affect are normal. Speech and behavior are normal.  ____________________________________________   LABS (all labs ordered are listed, but only abnormal results are displayed)  Labs Reviewed  BASIC METABOLIC PANEL - Abnormal; Notable for the following components:      Result Value   Glucose, Bld 107 (*)    All other components within normal limits  CBC  TROPONIN I (HIGH SENSITIVITY)  TROPONIN I (HIGH SENSITIVITY)  ____________________________________________  12 Lead EKG  Sinus rhythm, rate of 72.  Leftward axis.  Normal intervals.  No evidence of acute ischemia. ____________________________________________  RADIOLOGY  ED MD interpretation: CT head reviewed by me without evidence of acute intracranial pathology.  Official radiology report(s): CT HEAD WO CONTRAST (5MM)  Result Date: 10/04/2020 CLINICAL DATA:  Headache, hypertension, evaluate ICH. Headache and dizziness for several weeks. EXAM: CT HEAD WITHOUT CONTRAST TECHNIQUE: Contiguous axial images were obtained from the base of the skull through the vertex without intravenous contrast. COMPARISON:  None FINDINGS: Brain: Cerebral volume is normal for age. There is no acute intracranial hemorrhage. No demarcated cortical infarct. No extra-axial fluid collection. No evidence of an intracranial mass. No midline shift. Vascular: No hyperdense vessel. Atherosclerotic calcifications.  Skull: Normal. Negative for fracture or focal lesion. Sinuses/Orbits: Visualized orbits show no acute finding. Mild mucosal thickening at the imaged levels, most notably within the left sphenoid sinus. IMPRESSION: No evidence of acute intracranial abnormality. Mild paranasal sinus mucosal thickening at the imaged levels. Electronically Signed   By: Kellie Simmering DO   On: 10/04/2020 16:07    ____________________________________________   PROCEDURES and INTERVENTIONS  Procedure(s) performed (including Critical Care):  .1-3 Lead EKG Interpretation  Date/Time: 10/04/2020 3:39 PM Performed by: Vladimir Crofts, MD Authorized by: Vladimir Crofts, MD     Interpretation: normal     ECG rate:  68   ECG rate assessment: normal     Rhythm: sinus rhythm     Ectopy: none     Conduction: normal    Medications - No data to display  ____________________________________________   MDM / ED COURSE   65 year old male presents to the ED with hypertension, without evidence of endorgan damage, and amenable to outpatient management.  Exam reassuring without distress, neurologic or vascular deficits.  He looks clinically well and is asymptomatic here in the ED.  I suspect his associated dizziness is a separate pathology such as BPPV.  He has no stigmata of central vertigo.  Blood work is benign.  No evidence of ACS.  CT head without evidence of ICH.  No barriers to outpatient management at this time.  We discussed maintaining a blood pressure log book of his BPs at home to discuss with his PCP.  Return precautions were discussed.  Stable for outpatient management.  Clinical Course as of 10/04/20 1629  Wed Oct 04, 2020  1539 Discussed plan of care with patient and his wife.  We discussed likely to coexisting pathologies with his vertiginous dizziness that sound like BPPV as well as his hypertension.  We discussed causes of elevated blood pressure in the short-term and long-term.  We discussed taking his blood pressure  at home and maintaining a blood pressure log book and bring this to his PCP to discuss his antihypertensive regimen.  We discussed CT imaging of his head and the possibly of outpatient management thereafter.  He is in agreement. [DS]    Clinical Course User Index [DS] Vladimir Crofts, MD    ____________________________________________   FINAL CLINICAL IMPRESSION(S) / ED DIAGNOSES  Final diagnoses:  Primary hypertension  Benign paroxysmal positional vertigo, unspecified laterality     ED Discharge Orders     None        Jerilyn Gillaspie Tamala Julian   Note:  This document was prepared using Dragon voice recognition software and may include unintentional dictation errors.    Vladimir Crofts, MD 10/04/20 1630

## 2020-10-04 NOTE — Telephone Encounter (Signed)
[facility] - Client TELEPHONE ADVICE RECORD [facility] Patient Name: Justin Terrell ER Gender: Male DOB: [birth year] Age: 65 Y 43 M 24 D Return Phone Number: [phone] (Primary), [phone] (Secondary) Address: City/ State/ Zip: [place]  [address] Client [facility] - Client Client Site [place] - Day Physician Viviana Simpler- MD Contact Type Call Who Is Calling Patient / Member / Family / Caregiver Call Type Triage / Clinical Relationship To Patient Self Return Phone Number [phone] (Primary) Chief Complaint Dizziness Reason for Call Symptomatic / Request for [place] states he has pressure behind his eyes and forehead, ringing in ears , dizziness, and burning from ears to throat. Additional Comment Next available appt tomorrow Translation No Nurse Assessment Nurse: Doyle Askew, RN, Beth Date/Time (Eastern Time): 10/04/2020 10:21:07 AM Confirm and document reason for call. If symptomatic, describe symptoms. ---Caller states he has pressure behind his eyes and forehead, ringing in ears , dizziness, and burning from ears to throat. Caller states he has had high blood pressure for approx 2 weeks. Does the patient have any new or worsening symptoms? ---Yes Will a triage be completed? ---Yes Related visit to physician within the last 2 weeks? ---No Does the PT have any chronic conditions? (i.e. diabetes, asthma, this includes High risk factors for pregnancy, etc.) ---Yes List chronic conditions. ---HTN Is this a behavioral health or substance abuse call? ---No Guidelines Guideline Title Affirmed Question Affirmed Notes Nurse Date/Time (Eastern Time) Blood Pressure - High [id number] Systolic BP >= 0000000 OR Diastolic >= [id number] AND [id number] cardiac or neurologic symptoms (e.g., chest pain, difficulty breathing, unsteady gait, blurred vision) Doyle Askew, RN, [facility] 10/04/2020  10:25:36 AM PLEASE NOTE: All timestamps contained within this report are represented as [place] Standard Time. CONFIDENTIALTY NOTICE: This fax transmission is intended only for the addressee. It contains information that is legally privileged, confidential or otherwise protected from use or disclosure. If you are not the intended recipient, you are strictly prohibited from reviewing, disclosing, copying using or disseminating any of this information or taking any action in reliance on or regarding this information. If you have received this fax in error, please notify us immediately by telephone so that we can arrange for its return to [place]. Phone: [phone], Toll-Free: [phone], Fax: [phone] Page: 2 of 2 Call Id: [id number] [place]. Time Eilene Ghazi Time) Disposition Final User 10/04/2020 10:28:58 AM Go to ED Now Yes Doyle Askew, RN, Beth Caller Disagree/Comply Comply Caller Understands Yes PreDisposition InappropriateToAsk Care Advice Given Per Guideline GO TO ED NOW: * You need to be seen in the Emergency Department. * Go to the ED at ___________ [place] now. Drive carefully. CARE ADVICE given per High Blood Pressure (Adult) guideline. * Another adult should drive. NOTE TO TRIAGER - DRIVING: * You become worse * Becomes too weak to stand * Becomes confused * Passes out or faints CALL EMS 911 IF: Comments User: Melene Muller, RN Date/Time (Eastern Time): 10/04/2020 10:25:03 AM Current BP reading per at home BP cuff 191/111 User: Melene Muller, RN Date/Time Eilene Ghazi Time): 10/04/2020 10:26:07 AM 2nd reading at home 189/107 Referrals [facility] - ED

## 2020-10-04 NOTE — ED Notes (Signed)
Pt ambulated independently to room 6 from triage, directed by Triage RN

## 2020-10-05 ENCOUNTER — Encounter: Payer: Self-pay | Admitting: Internal Medicine

## 2020-10-05 ENCOUNTER — Ambulatory Visit: Payer: BC Managed Care – PPO | Admitting: Internal Medicine

## 2020-10-05 ENCOUNTER — Other Ambulatory Visit: Payer: Self-pay

## 2020-10-05 ENCOUNTER — Telehealth: Payer: Self-pay | Admitting: Internal Medicine

## 2020-10-05 DIAGNOSIS — I10 Essential (primary) hypertension: Secondary | ICD-10-CM | POA: Diagnosis not present

## 2020-10-05 MED ORDER — VALSARTAN-HYDROCHLOROTHIAZIDE 320-25 MG PO TABS: 1.0000 | ORAL_TABLET | Freq: Every day | ORAL | 3 refills | Status: DC

## 2020-10-05 NOTE — Telephone Encounter (Signed)
I called patient back to get more information regarding current symptoms and readings to determine whether needs urgent eval now or can wait to discuss with Dr. Silvio Pate when he comes in around 1pm today.   Patient was on a phone call and couldn't speak with me, he will call right back.

## 2020-10-05 NOTE — Telephone Encounter (Signed)
Pt is coming in at 145 today.

## 2020-10-05 NOTE — Telephone Encounter (Signed)
We will review all of this at the upcoming visit today

## 2020-10-05 NOTE — Telephone Encounter (Signed)
Mr. Brylon called in this morning and stated that he called yesterday and was sent to triage and the nurse told him to go to the ER due to the high BP he went to the er and they told him it it was white coat syndrome and it was nothing that they can do and they released him and  he stated that he shouldn't have been released due to the high BP , and he took it this morning  173/105. He still having pressure behind his eyes and ringing in the ears.

## 2020-10-05 NOTE — Assessment & Plan Note (Signed)
BP Readings from Last 3 Encounters:  10/05/20 (!) 162/92  10/04/20 (!) 189/99  09/11/20 (!) 176/104   Recent loss of control of unclear etiology For now, will double the valsartan and add HCTZ

## 2020-10-05 NOTE — Progress Notes (Signed)
Subjective:    Patient ID: Justin Terrell, male    DOB: [birth year], 65 y.o.   MRN: [id number]  HPI Here for ER follow up---elevated BP This visit occurred during the SARS-CoV-2 public health emergency.  Safety protocols were in place, including screening questions prior to the visit, additional usage of staff PPE, and extensive cleaning of exam room while observing appropriate contact time as indicated for disinfecting solutions.   His BP has been running up for a while---was high when he was seen for a possible ruptured disc Then wellness screening at work--- 160s/?----- 2 weeks ago  Has had spinning vertigo upon awakening for a few days Then it happened again after bending down and getting up Called here and referred to ER with BP ~190/110  No chest pain, headache No focal neurologic symptoms  Current Outpatient Medications on File Prior to Visit  Medication Sig Dispense Refill   aspirin EC 81 MG tablet Take 81 mg by mouth daily.     atorvastatin (LIPITOR) 40 MG tablet Take 1 tablet (40 mg total) by mouth daily. 90 tablet 3   metoprolol succinate (TOPROL-XL) 25 MG 24 hr tablet Take 1 tablet (25 mg total) by mouth daily. 90 tablet 3   valsartan (DIOVAN) 160 MG tablet TAKE 1 TABLET(160 MG) BY MOUTH DAILY 90 tablet 3   cyclobenzaprine (FLEXERIL) 10 MG tablet Take 0.5-1 tablets (5-10 mg total) by mouth at bedtime as needed for muscle spasms. (Patient not taking: Reported on 10/05/2020) 30 tablet 0   No current facility-administered medications on file prior to visit.    Allergies  Allergen Reactions   Codeine Nausea And Vomiting   Pravastatin Other (See Comments)    Reaction:  Chest pain     Past Medical History:  Diagnosis Date   AVM (arteriovenous malformation) of ascending colon 12/25/2012   Cancer ([place])    Prostate   Coronary atherosclerosis of native coronary artery 05/26/2015   GERD (gastroesophageal reflux disease)    aspirin sensitive, prn meds only   Hyperlipidemia     Hypertension    Lumbar disc disease    Myocardial infarction [facility])    Nephrolithiasis    Personal history of colonic adenoma [id number]   Ureteral duplication, right     Past Surgical History:  Procedure Laterality Date   ANGIOPLASTY     thigh   CARDIAC CATHETERIZATION N/A 07/05/2014   Procedure: Left Heart Cath and Coronary Angiography;  Surgeon: Isaias Cowman, MD;  Location: [facility];  Service: Cardiovascular;  Laterality: N/A;   DOPPLER ECHOCARDIOGRAPHY  06/24/07   stress echo normal. Nl baseline EF   HAND NERVE REPAIR  1970's   both hands   KNEE ARTHROSCOPY  1990's   right    [place]  05/2017   robotic prostatectomy   Right leg compound fracture repair  1976    Family History  Problem Relation Age of Onset   Heart disease Father    Hypertension Father    Coronary artery disease Father    Valvular heart disease Father    Cancer Mother        colon cancer   Colon cancer Mother 30   Throat cancer Mother    Esophageal cancer Mother    Dementia Mother    Diabetes Neg Hx    Prostate cancer Neg Hx    Kidney cancer Neg Hx    Rectal cancer Neg Hx    Stomach cancer  Neg Hx     Social History   Socioeconomic History   Marital status: Married    Spouse name: Not on file   Number of children: 0   Years of education: Not on file   Highest education level: Not on file  Occupational History   Occupation: Information systems    Employer: [employer]  Tobacco Use   Smoking status: Former    Packs/day: 0.10    Types: Cigarettes    Quit date: 02/25/2009    Years since quitting: 11.6   Smokeless tobacco: Never  Vaping Use   Vaping Use: Never used  Substance and Sexual Activity   Alcohol use: No    Alcohol/week: 0.0 standard drinks   Drug use: No   Sexual activity: Not on file  Other Topics Concern   Not on file  Social History Narrative   2nd marriage   Wife has 2 children (1 died)   Works in [place] Strain: Not on Art therapist Insecurity: Not on file  Transportation Needs: Not on file  Physical Activity: Not on file  Stress: Not on file  Social Connections: Not on file  Intimate Partner Violence: Not on file   Review of Systems Just got treadmill and has been using this Eating fine No history of gout Some cough and congestion     Objective:   Physical Exam Constitutional:      Appearance: Normal appearance.  Cardiovascular:     Rate and Rhythm: Normal rate and regular rhythm.     Heart sounds: No murmur heard.   No gallop.  Pulmonary:     Effort: Pulmonary effort is normal.     Breath sounds: Normal breath sounds. No wheezing or rales.  Musculoskeletal:     Cervical back: Neck supple.     Right lower leg: No edema.     Left lower leg: No edema.  Lymphadenopathy:     Cervical: No cervical adenopathy.  Neurological:     Mental Status: He is alert.           Assessment & Plan:

## 2020-10-05 NOTE — Telephone Encounter (Signed)
Spoke with patient regarding his current vitals and symptoms:  Patient's BP this am 168-170/100-107 Pulse 79. NOTE: He has taken both of his blood pressure medications this am.   Denies any SOB, Chest Pain, dizziness, numbness or tingling.  NO Vertigo today.  Reports pressure behind both of his eye and ringing in ears; however the ringing has been going on for 1 month.  Denies any other symptoms.   Patient has had increasing bp pattern noted in chart at visits over the past month.   Patient reports they gave him no medication in the ER and other than doing a CT, they did nothing for him.  They discharged him from the ER with BP of 189/104 and told him just to follow up with his PCP.   I will discuss with Dr. Silvio Pate when he comes in office in the next couple of hours and make a plan for patient.  I did inform him that if his symptoms worsen ie: (CVS symptoms, chest pain/SOB or severe dizziness etc) that he is to call 911 to return to the hospital.  Patient verbalizes understanding.

## 2020-10-05 NOTE — Telephone Encounter (Signed)
Spoke with Larene Beach, CMA. They had a cancellation for an add on at 130 today so we will put patient in at 145 (to arrive 56mns early).   I contacted patient and he agreed to come at 145 to see Dr. LSilvio Pate  He thanks u[place]for our help today.

## 2020-10-16 ENCOUNTER — Ambulatory Visit: Payer: BC Managed Care – PPO | Admitting: Internal Medicine

## 2020-10-16 ENCOUNTER — Encounter: Payer: BC Managed Care – PPO | Admitting: Family Medicine

## 2020-10-16 ENCOUNTER — Encounter: Payer: Self-pay | Admitting: Family Medicine

## 2020-10-16 ENCOUNTER — Other Ambulatory Visit: Payer: Self-pay

## 2020-10-17 NOTE — Progress Notes (Signed)
Left prior to being seen - 30 minutes after appointment time.

## 2020-11-07 ENCOUNTER — Encounter: Payer: Self-pay | Admitting: Internal Medicine

## 2020-11-07 ENCOUNTER — Other Ambulatory Visit: Payer: Self-pay

## 2020-11-07 ENCOUNTER — Ambulatory Visit (INDEPENDENT_AMBULATORY_CARE_PROVIDER_SITE_OTHER): Payer: BC Managed Care – PPO | Admitting: Internal Medicine

## 2020-11-07 VITALS — BP 136/88 | HR 72 | Temp 97.8°F | Ht 72.0 in | Wt 186.0 lb

## 2020-11-07 DIAGNOSIS — I10 Essential (primary) hypertension: Secondary | ICD-10-CM

## 2020-11-07 DIAGNOSIS — Z23 Encounter for immunization: Secondary | ICD-10-CM | POA: Diagnosis not present

## 2020-11-07 MED ORDER — VALSARTAN 160 MG PO TABS: 160.0000 mg | ORAL_TABLET | Freq: Every day | ORAL | 3 refills | Status: DC

## 2020-11-07 NOTE — Progress Notes (Signed)
Subjective:    Patient ID: Justin Terrell, male    DOB: 05-20-1955, 65 y.o.   MRN: [id number]  HPI Here for follow up of HTN This visit occurred during the SARS-CoV-2 public health emergency.  Safety protocols were in place, including screening questions prior to the visit, additional usage of staff PPE, and extensive cleaning of exam room while observing appropriate contact time as indicated for disinfecting solutions.   Didn't tolerate the increased medications Headaches, nausea BP down to 120/60 He switched back to the valsartan '160mg'$  Checking BP regularly--- 1-3 times a day  Feels back to normal BP now running 140's/80-90 All bad symptoms are gone  Current Outpatient Medications on File Prior to Visit  Medication Sig Dispense Refill   aspirin EC 81 MG tablet Take 81 mg by mouth daily.     atorvastatin (LIPITOR) 40 MG tablet Take 1 tablet (40 mg total) by mouth daily. 90 tablet 3   cyclobenzaprine (FLEXERIL) 10 MG tablet Take 0.5-1 tablets (5-10 mg total) by mouth at bedtime as needed for muscle spasms. 30 tablet 0   metoprolol succinate (TOPROL-XL) 25 MG 24 hr tablet Take 1 tablet (25 mg total) by mouth daily. 90 tablet 3   No current facility-administered medications on file prior to visit.    Allergies  Allergen Reactions   Codeine Nausea And Vomiting   Pravastatin Other (See Comments)    Reaction:  Chest pain     Past Medical History:  Diagnosis Date   AVM (arteriovenous malformation) of ascending colon 12/25/2012   Cancer ([place])    Prostate   Coronary atherosclerosis of native coronary artery 05/26/2015   GERD (gastroesophageal reflux disease)    aspirin sensitive, prn meds only   Hyperlipidemia    Hypertension    Lumbar disc disease    Myocardial infarction [facility])    Nephrolithiasis    Personal history of colonic adenoma Q000111Q   Ureteral duplication, right     Past Surgical History:  Procedure Laterality Date   ANGIOPLASTY     thigh   CARDIAC  CATHETERIZATION N/A 07/05/2014   Procedure: Left Heart Cath and Coronary Angiography;  Surgeon: Isaias Cowman, MD;  Location: [facility];  Service: Cardiovascular;  Laterality: N/A;   DOPPLER ECHOCARDIOGRAPHY  06/24/07   stress echo normal. Nl baseline EF   HAND NERVE REPAIR  1970's   both hands   KNEE ARTHROSCOPY  1990's   right    [place]  05/2017   robotic prostatectomy   Right leg compound fracture repair  1976    Family History  Problem Relation Age of Onset   Heart disease Father    Hypertension Father    Coronary artery disease Father    Valvular heart disease Father    Cancer Mother        colon cancer   Colon cancer Mother 8   Throat cancer Mother    Esophageal cancer Mother    Dementia Mother    Diabetes Neg Hx    Prostate cancer Neg Hx    Kidney cancer Neg Hx    Rectal cancer Neg Hx    Stomach cancer Neg Hx     Social History   Socioeconomic History   Marital status: Married    Spouse name: Not on file   Number of children: 0   Years of education: Not on file   Highest education level: Not on file  Occupational History  Occupation: Sales executive: [employer]  Tobacco Use   Smoking status: Former    Packs/day: 0.10    Types: Cigarettes    Quit date: 02/25/2009    Years since quitting: 11.7   Smokeless tobacco: Never  Vaping Use   Vaping Use: Never used  Substance and Sexual Activity   Alcohol use: No    Alcohol/week: 0.0 standard drinks   Drug use: No   Sexual activity: Not on file  Other Topics Concern   Not on file  Social History Narrative   2nd marriage   Wife has 2 children (1 died)   Works in [place] Strain: Not on file  Food Insecurity: Not on file  Transportation Needs: Not on file  Physical Activity: Not on file  Stress: Not on file  Social Connections: Not on file  Intimate Partner Violence: Not on file    Review of Systems Sleeps okay---some awakening (some nocturia) Appetite is good Weight stable     Objective:   Physical Exam Constitutional:      Appearance: Normal appearance.  Cardiovascular:     Rate and Rhythm: Normal rate and regular rhythm.     Heart sounds: No murmur heard.   No gallop.  Pulmonary:     Effort: Pulmonary effort is normal.     Breath sounds: Normal breath sounds. No wheezing or rales.  Musculoskeletal:     Cervical back: Neck supple.     Right lower leg: No edema.     Left lower leg: No edema.  Lymphadenopathy:     Cervical: No cervical adenopathy.  Neurological:     Mental Status: He is alert.           Assessment & Plan:

## 2020-11-07 NOTE — Assessment & Plan Note (Signed)
BP Readings from Last 3 Encounters:  11/07/20 136/88  10/16/20 130/80  10/05/20 (!) 162/92   Unclear why his BP just came back to normal  Back on the lower valsartan with metoprolol He will monitor at home

## 2020-11-07 NOTE — Addendum Note (Signed)
Addended by: Pilar Grammes on: 11/07/2020 10:37 AM   Modules accepted: Orders

## 2021-01-08 DIAGNOSIS — J209 Acute bronchitis, unspecified: Secondary | ICD-10-CM | POA: Diagnosis not present

## 2021-01-08 DIAGNOSIS — R059 Cough, unspecified: Secondary | ICD-10-CM | POA: Diagnosis not present

## 2021-01-17 DIAGNOSIS — J069 Acute upper respiratory infection, unspecified: Secondary | ICD-10-CM | POA: Diagnosis not present

## 2021-02-03 ENCOUNTER — Other Ambulatory Visit: Payer: Self-pay | Admitting: Internal Medicine

## 2021-06-04 ENCOUNTER — Encounter: Payer: BC Managed Care – PPO | Admitting: Internal Medicine

## 2021-06-21 DIAGNOSIS — N393 Stress incontinence (female) (male): Secondary | ICD-10-CM | POA: Diagnosis not present

## 2021-06-21 DIAGNOSIS — N5201 Erectile dysfunction due to arterial insufficiency: Secondary | ICD-10-CM | POA: Diagnosis not present

## 2021-06-21 DIAGNOSIS — Z8546 Personal history of malignant neoplasm of prostate: Secondary | ICD-10-CM | POA: Diagnosis not present

## 2021-07-24 ENCOUNTER — Ambulatory Visit (INDEPENDENT_AMBULATORY_CARE_PROVIDER_SITE_OTHER): Payer: BC Managed Care – PPO | Admitting: Internal Medicine

## 2021-07-24 ENCOUNTER — Encounter: Payer: Self-pay | Admitting: Internal Medicine

## 2021-07-24 VITALS — BP 122/78 | HR 68 | Temp 98.1°F | Ht 71.0 in | Wt 186.0 lb

## 2021-07-24 DIAGNOSIS — Z0000 Encounter for general adult medical examination without abnormal findings: Secondary | ICD-10-CM | POA: Diagnosis not present

## 2021-07-24 DIAGNOSIS — I2510 Atherosclerotic heart disease of native coronary artery without angina pectoris: Secondary | ICD-10-CM

## 2021-07-24 DIAGNOSIS — I10 Essential (primary) hypertension: Secondary | ICD-10-CM | POA: Diagnosis not present

## 2021-07-24 DIAGNOSIS — Z8546 Personal history of malignant neoplasm of prostate: Secondary | ICD-10-CM

## 2021-07-24 DIAGNOSIS — Z23 Encounter for immunization: Secondary | ICD-10-CM

## 2021-07-24 NOTE — Patient Instructions (Signed)
You can try over the counter diclofenac gel for your knee.

## 2021-07-24 NOTE — Assessment & Plan Note (Signed)
BP Readings from Last 3 Encounters:  07/24/21 122/78  11/07/20 136/88  10/16/20 130/80   Good control on heart meds

## 2021-07-24 NOTE — Addendum Note (Signed)
Addended by: Pilar Grammes on: 07/24/2021 11:47 AM   Modules accepted: Orders

## 2021-07-24 NOTE — Assessment & Plan Note (Signed)
Will check PSA 

## 2021-07-24 NOTE — Progress Notes (Signed)
Subjective:    Patient ID: Justin Terrell, male    DOB: 03/17/1955, 66 y.o.   MRN: [id number]  HPI Here for physical  Getting ready to retire--in January Still working full time and remote Just back from a [employer] regularly  No new health concerns Current Outpatient Medications on File Prior to Visit  Medication Sig Dispense Refill   aspirin EC 81 MG tablet Take 81 mg by mouth daily.     atorvastatin (LIPITOR) 40 MG tablet Take 1 tablet (40 mg total) by mouth daily. 90 tablet 3   cyclobenzaprine (FLEXERIL) 10 MG tablet Take 0.5-1 tablets (5-10 mg total) by mouth at bedtime as needed for muscle spasms. 30 tablet 0   metoprolol succinate (TOPROL-XL) 25 MG 24 hr tablet Take 1 tablet (25 mg total) by mouth daily. 90 tablet 3   valsartan (DIOVAN) 160 MG tablet TAKE 1 TABLET(160 MG) BY MOUTH DAILY 90 tablet 3   No current facility-administered medications on file prior to visit.    Allergies  Allergen Reactions   Codeine Nausea And Vomiting   Pravastatin Other (See Comments)    Reaction:  Chest pain     Past Medical History:  Diagnosis Date   AVM (arteriovenous malformation) of ascending colon 12/25/2012   Cancer ([place])    Prostate   Coronary atherosclerosis of native coronary artery 05/26/2015   GERD (gastroesophageal reflux disease)    aspirin sensitive, prn meds only   Hyperlipidemia    Hypertension    Lumbar disc disease    Myocardial infarction [facility])    Nephrolithiasis    Personal history of colonic adenoma 25/10/5636   Ureteral duplication, right     Past Surgical History:  Procedure Laterality Date   ANGIOPLASTY     thigh   CARDIAC CATHETERIZATION N/A 07/05/2014   Procedure: Left Heart Cath and Coronary Angiography;  Surgeon: Isaias Cowman, MD;  Location: [facility];  Service: Cardiovascular;  Laterality: N/A;   DOPPLER ECHOCARDIOGRAPHY  06/24/07   stress echo normal. Nl baseline EF   HAND NERVE REPAIR  1970's   both hands   KNEE  ARTHROSCOPY  1990's   right    [place]  05/2017   robotic prostatectomy   Right leg compound fracture repair  1976    Family History  Problem Relation Age of Onset   Heart disease Father    Hypertension Father    Coronary artery disease Father    Valvular heart disease Father    Cancer Mother        colon cancer   Colon cancer Mother 64   Throat cancer Mother    Esophageal cancer Mother    Dementia Mother    Diabetes Neg Hx    Prostate cancer Neg Hx    Kidney cancer Neg Hx    Rectal cancer Neg Hx    Stomach cancer Neg Hx     Social History   Socioeconomic History   Marital status: Married    Spouse name: Not on file   Number of children: 0   Years of education: Not on file   Highest education level: Not on file  Occupational History   Occupation: Information systems    Employer: [employer]  Tobacco Use   Smoking status: Former    Packs/day: 0.10    Types: Cigarettes    Quit date: 02/25/2009    Years since quitting: 12.4    Passive exposure: Past (  as a child)   Smokeless tobacco: Never  Vaping Use   Vaping Use: Never used  Substance and Sexual Activity   Alcohol use: No    Alcohol/week: 0.0 standard drinks   Drug use: No   Sexual activity: Not on file  Other Topics Concern   Not on file  Social History Narrative   2nd marriage   Wife has 2 children (1 died)   Works in [place] Strain: Not on file  Food Insecurity: Not on file  Transportation Needs: Not on file  Physical Activity: Not on file  Stress: Not on file  Social Connections: Not on file  Intimate Partner Violence: Not on file   Review of Systems  Constitutional:  Negative for fatigue and unexpected weight change.       Wears seat belt  HENT:  Positive for tinnitus. Negative for dental problem and hearing loss.        Overdue for dentist  Eyes:  Negative for redness and visual disturbance.       No diplopia  or unilateral vision loss  Respiratory:  Negative for cough, chest tightness and shortness of breath.   Cardiovascular:  Negative for chest pain, palpitations and leg swelling.  Gastrointestinal:  Negative for blood in stool and constipation.       No heartburn   Endocrine: Negative for polydipsia and polyuria.  Genitourinary:  Negative for difficulty urinating and urgency.       Uses sildenafil intermittently  Musculoskeletal:  Positive for arthralgias. Negative for back pain and joint swelling.       Mild joint symptoms--especially right knee  Skin:  Negative for rash.       No suspicious lesions  Allergic/Immunologic: Negative for environmental allergies and immunocompromised state.  Neurological:  Negative for dizziness, syncope and light-headedness.       Some sinus pressure   Hematological:  Negative for adenopathy. Bruises/bleeds easily.  Psychiatric/Behavioral:  Negative for dysphoric mood and sleep disturbance. The patient is not nervous/anxious.       Objective:   Physical Exam Constitutional:      Appearance: Normal appearance.  HENT:     Mouth/Throat:     Pharynx: No oropharyngeal exudate or posterior oropharyngeal erythema.  Eyes:     Conjunctiva/sclera: Conjunctivae normal.     Pupils: Pupils are equal, round, and reactive to light.  Cardiovascular:     Rate and Rhythm: Normal rate and regular rhythm.     Pulses: Normal pulses.     Heart sounds: No murmur heard.   No gallop.  Pulmonary:     Effort: Pulmonary effort is normal.     Breath sounds: Normal breath sounds. No wheezing or rales.  Abdominal:     Palpations: Abdomen is soft.     Tenderness: There is no abdominal tenderness.  Musculoskeletal:     Cervical back: Neck supple.     Right lower leg: No edema.     Left lower leg: No edema.  Lymphadenopathy:     Cervical: No cervical adenopathy.  Skin:    Findings: No lesion or rash.  Neurological:     General: No focal deficit present.     Mental  Status: He is alert and oriented to person, place, and time.  Psychiatric:        Mood and Affect: Mood normal.        Behavior: Behavior normal.  Assessment & Plan:

## 2021-07-24 NOTE — Assessment & Plan Note (Signed)
Healthy Discussed fitness--adding resistance Colon due 2024 Had 2 bivalent COVID vaccines already Flu vaccine in the fall Pneumovax 23 and Td today

## 2021-07-24 NOTE — Assessment & Plan Note (Signed)
Quiet On valsartan 160, metoprolol 25 daily ASA 81, atorvastatin 40

## 2021-07-25 LAB — COMPREHENSIVE METABOLIC PANEL
ALT: 24 IU/L (ref 0–44)
AST: 20 IU/L (ref 0–40)
Albumin/Globulin Ratio: 1.5 (ref 1.2–2.2)
Albumin: 4 g/dL (ref 3.8–4.8)
Alkaline Phosphatase: 76 IU/L (ref 44–121)
BUN/Creatinine Ratio: 14 (ref 10–24)
BUN: 14 mg/dL (ref 8–27)
Bilirubin Total: 0.3 mg/dL (ref 0.0–1.2)
CO2: 25 mmol/L (ref 20–29)
Calcium: 9.2 mg/dL (ref 8.6–10.2)
Chloride: 102 mmol/L (ref 96–106)
Creatinine, Ser: 1.01 mg/dL (ref 0.76–1.27)
Globulin, Total: 2.6 g/dL (ref 1.5–4.5)
Glucose: 99 mg/dL (ref 70–99)
Potassium: 5.1 mmol/L (ref 3.5–5.2)
Sodium: 140 mmol/L (ref 134–144)
Total Protein: 6.6 g/dL (ref 6.0–8.5)
eGFR: 83 mL/min/{1.73_m2} (ref >59)

## 2021-07-25 LAB — PSA: Prostate Specific Ag, Serum: <0.1 ng/mL (ref 0.0–4.0)

## 2021-07-25 LAB — LIPID PANEL
Chol/HDL Ratio: 3.6 ratio (ref 0.0–5.0)
Cholesterol, Total: 185 mg/dL (ref 100–199)
HDL: 51 mg/dL (ref >39)
LDL Chol Calc (NIH): 111 mg/dL — ABNORMAL HIGH (ref 0–99)
Triglycerides: 127 mg/dL (ref 0–149)
VLDL Cholesterol Cal: 23 mg/dL (ref 5–40)

## 2021-07-25 LAB — CBC
Hematocrit: 46 % (ref 37.5–51.0)
Hemoglobin: 16 g/dL (ref 13.0–17.7)
MCH: 33.3 pg — ABNORMAL HIGH (ref 26.6–33.0)
MCHC: 34.8 g/dL (ref 31.5–35.7)
MCV: 96 fL (ref 79–97)
Platelets: 282 10*3/uL (ref 150–450)
RBC: 4.8 x10E6/uL (ref 4.14–5.80)
RDW: 14.3 % (ref 11.6–15.4)
WBC: 10 10*3/uL (ref 3.4–10.8)

## 2021-08-28 ENCOUNTER — Other Ambulatory Visit: Payer: Self-pay | Admitting: Internal Medicine

## 2021-08-29 MED ORDER — METOPROLOL SUCCINATE ER 25 MG PO TB24: ORAL_TABLET | ORAL | 3 refills | Status: DC

## 2021-08-29 NOTE — Addendum Note (Signed)
Addended by: Pilar Grammes on: 08/29/2021 07:43 AM   Modules accepted: Orders

## 2021-08-29 NOTE — Addendum Note (Signed)
Addended by: Pilar Grammes on: 08/29/2021 08:12 AM   Modules accepted: Orders

## 2021-12-25 DIAGNOSIS — I252 Old myocardial infarction: Secondary | ICD-10-CM | POA: Diagnosis not present

## 2021-12-25 DIAGNOSIS — Z9889 Other specified postprocedural states: Secondary | ICD-10-CM | POA: Diagnosis not present

## 2021-12-25 DIAGNOSIS — R0789 Other chest pain: Secondary | ICD-10-CM | POA: Diagnosis not present

## 2021-12-25 DIAGNOSIS — I10 Essential (primary) hypertension: Secondary | ICD-10-CM | POA: Diagnosis not present

## 2022-01-07 DIAGNOSIS — R079 Chest pain, unspecified: Secondary | ICD-10-CM | POA: Diagnosis not present

## 2022-01-07 DIAGNOSIS — I25110 Atherosclerotic heart disease of native coronary artery with unstable angina pectoris: Secondary | ICD-10-CM | POA: Diagnosis not present

## 2022-01-22 DIAGNOSIS — Z23 Encounter for immunization: Secondary | ICD-10-CM | POA: Diagnosis not present

## 2022-01-22 DIAGNOSIS — I10 Essential (primary) hypertension: Secondary | ICD-10-CM | POA: Diagnosis not present

## 2022-01-22 DIAGNOSIS — I252 Old myocardial infarction: Secondary | ICD-10-CM | POA: Diagnosis not present

## 2022-01-22 DIAGNOSIS — Z9889 Other specified postprocedural states: Secondary | ICD-10-CM | POA: Diagnosis not present

## 2022-01-27 ENCOUNTER — Other Ambulatory Visit: Payer: Self-pay | Admitting: Internal Medicine

## 2022-01-28 ENCOUNTER — Other Ambulatory Visit: Payer: Self-pay | Admitting: Internal Medicine

## 2022-05-23 DIAGNOSIS — I25110 Atherosclerotic heart disease of native coronary artery with unstable angina pectoris: Secondary | ICD-10-CM | POA: Diagnosis not present

## 2022-05-23 DIAGNOSIS — Z9889 Other specified postprocedural states: Secondary | ICD-10-CM | POA: Diagnosis not present

## 2022-05-23 DIAGNOSIS — I10 Essential (primary) hypertension: Secondary | ICD-10-CM | POA: Diagnosis not present

## 2022-05-23 DIAGNOSIS — E785 Hyperlipidemia, unspecified: Secondary | ICD-10-CM | POA: Diagnosis not present

## 2022-05-23 DIAGNOSIS — I252 Old myocardial infarction: Secondary | ICD-10-CM | POA: Diagnosis not present

## 2022-06-24 ENCOUNTER — Ambulatory Visit (INDEPENDENT_AMBULATORY_CARE_PROVIDER_SITE_OTHER): Payer: Medicare HMO | Admitting: Internal Medicine

## 2022-06-24 ENCOUNTER — Encounter: Payer: Self-pay | Admitting: Internal Medicine

## 2022-06-24 VITALS — BP 138/76 | HR 84 | Temp 98.0°F | Ht 71.0 in | Wt 183.0 lb

## 2022-06-24 DIAGNOSIS — M2550 Pain in unspecified joint: Secondary | ICD-10-CM | POA: Diagnosis not present

## 2022-06-24 DIAGNOSIS — J22 Unspecified acute lower respiratory infection: Secondary | ICD-10-CM | POA: Insufficient documentation

## 2022-06-24 MED ORDER — HYDROCODONE BIT-HOMATROP MBR 5-1.5 MG/5ML PO SOLN: 5.0000 mL | Freq: Every evening | ORAL | 0 refills | Status: DC | PRN

## 2022-06-24 MED ORDER — PREDNISONE 20 MG PO TABS: 40.0000 mg | ORAL_TABLET | Freq: Every day | ORAL | 0 refills | Status: DC

## 2022-06-24 NOTE — Assessment & Plan Note (Signed)
Seems both bronchial and sinus Will give prednisone 40mg  daily x 3 then 20mg  daily x 3 Hycodan prn If worsening or persists later in the week, will rx doxy 100 bid x 7 days

## 2022-06-24 NOTE — Assessment & Plan Note (Signed)
Multiple pains--shoulder (wonders about cortisone injection), knees etc Will set up with Dr Dion Saucier

## 2022-06-24 NOTE — Progress Notes (Signed)
Subjective:    Patient ID: Justin Terrell, male    DOB: 1955-08-20, 67 y.o.   MRN: [id number]  HPI Here due to respiratory infection  Went on a cruise to [place] Came back 4/26 and flew back the next day Started with symptoms yesterday---cough, sore throat, lots of post nasal drip---worse now Now coughing up "stuff from my lungs" No fever, chills or sweats Some SOB----with activity Some headache--relates to the cough (frontal pressure) Has some substernal chest pain--relates to the cough or reflux (stress test was reassuring)  Tried cough med--DM (kept him up at night)  COVID test negative yesterday  Current Outpatient Medications on File Prior to Visit  Medication Sig Dispense Refill   aspirin EC 81 MG tablet Take 81 mg by mouth daily.     atorvastatin (LIPITOR) 40 MG tablet Take 1 tablet (40 mg total) by mouth daily. 90 tablet 3   cyclobenzaprine (FLEXERIL) 10 MG tablet Take 0.5-1 tablets (5-10 mg total) by mouth at bedtime as needed for muscle spasms. 30 tablet 0   metoprolol succinate (TOPROL-XL) 25 MG 24 hr tablet TAKE 1 TABLET(25 MG) BY MOUTH DAILY 90 tablet 3   valsartan (DIOVAN) 160 MG tablet TAKE 1 TABLET(160 MG) BY MOUTH DAILY 90 tablet 3   No current facility-administered medications on file prior to visit.    Allergies  Allergen Reactions   Codeine Nausea And Vomiting   Pravastatin Other (See Comments)    Reaction:  Chest pain     Past Medical History:  Diagnosis Date   AVM (arteriovenous malformation) of ascending colon 12/25/2012   Cancer ([facility])    Prostate   Coronary atherosclerosis of native coronary artery 05/26/2015   GERD (gastroesophageal reflux disease)    aspirin sensitive, prn meds only   Hyperlipidemia    Hypertension    Lumbar disc disease    Myocardial infarction [facility])    Nephrolithiasis    Personal history of colonic adenoma 12/30/2012   Ureteral duplication, right     Past Surgical History:  Procedure Laterality Date   ANGIOPLASTY      thigh   CARDIAC CATHETERIZATION N/A 07/05/2014   Procedure: Left Heart Cath and Coronary Angiography;  Surgeon: Marcina Millard, MD;  Location: [facility];  Service: Cardiovascular;  Laterality: N/A;   DOPPLER ECHOCARDIOGRAPHY  06/24/07   stress echo normal. Nl baseline EF   HAND NERVE REPAIR  1970's   both hands   KNEE ARTHROSCOPY  1990's   right    LUMBAR DISC SURGERY  1993   PROSTATE SURGERY  05/2017   robotic prostatectomy   Right leg compound fracture repair  1976    Family History  Problem Relation Age of Onset   Heart disease Father    Hypertension Father    Coronary artery disease Father    Valvular heart disease Father    Cancer Mother        colon cancer   Colon cancer Mother 88   Throat cancer Mother    Esophageal cancer Mother    Dementia Mother    Diabetes Neg Hx    Prostate cancer Neg Hx    Kidney cancer Neg Hx    Rectal cancer Neg Hx    Stomach cancer Neg Hx     Social History   Socioeconomic History   Marital status: Married    Spouse name: Not on file   Number of children: 0   Years of education: Not on file   Highest education  level: Not on file  Occupational History   Occupation: Information systems    Employer: [employer]  Tobacco Use   Smoking status: Former    Packs/day: .1    Types: Cigarettes    Quit date: 02/25/2009    Years since quitting: 13.3    Passive exposure: Past (as a child)   Smokeless tobacco: Never  Vaping Use   Vaping Use: Never used  Substance and Sexual Activity   Alcohol use: No    Alcohol/week: 0.0 standard drinks of alcohol   Drug use: No   Sexual activity: Not on file  Other Topics Concern   Not on file  Social History Narrative   2nd marriage   Wife has 2 children (1 died)   Works in IT      Has living will   Careers information officer, 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  HENT:     Head:     Comments: No sinus tenderness    Right Ear: Tympanic membrane and ear canal normal.     Left Ear: Tympanic membrane and ear canal normal.     Mouth/Throat:     Comments: Slight pharyngeal injection without enlarged tonsils or exudate Pulmonary:     Effort: Pulmonary effort is normal.     Breath sounds: No rales.     Comments: Mild prolonged expiratory phase and wheezes Musculoskeletal:     Cervical back: Neck supple.  Lymphadenopathy:     Cervical: No cervical adenopathy.  Neurological:     Mental Status: He is alert.            Assessment & Plan:

## 2022-06-25 ENCOUNTER — Encounter: Payer: Self-pay | Admitting: *Deleted

## 2022-06-26 DIAGNOSIS — M19171 Post-traumatic osteoarthritis, right ankle and foot: Secondary | ICD-10-CM | POA: Diagnosis not present

## 2022-06-26 DIAGNOSIS — M722 Plantar fascial fibromatosis: Secondary | ICD-10-CM | POA: Diagnosis not present

## 2022-06-26 DIAGNOSIS — M7541 Impingement syndrome of right shoulder: Secondary | ICD-10-CM | POA: Diagnosis not present

## 2022-06-27 ENCOUNTER — Telehealth: Payer: Self-pay | Admitting: Internal Medicine

## 2022-06-27 MED ORDER — DOXYCYCLINE HYCLATE 100 MG PO TABS: 100.0000 mg | ORAL_TABLET | Freq: Two times a day (BID) | ORAL | 0 refills | Status: DC

## 2022-06-27 NOTE — Telephone Encounter (Signed)
Please let him know I sent the antibiotic

## 2022-06-27 NOTE — Addendum Note (Signed)
Addended by: Tillman Abide I on: 06/27/2022 11:30 AM   Modules accepted: Orders

## 2022-06-27 NOTE — Telephone Encounter (Signed)
Patient called in and stated that he was seen Monday and informed to update Dr. Alphonsus Sias about how he was feeling. He stated that since Monday the congestion has gotten worse. He stated that an antibiotic would be prescribed if he wasn't doing any better. Please advise. Thank you!

## 2022-06-27 NOTE — Telephone Encounter (Signed)
Left message on VM per DPR. 

## 2022-07-01 ENCOUNTER — Ambulatory Visit (INDEPENDENT_AMBULATORY_CARE_PROVIDER_SITE_OTHER)
Admission: RE | Admit: 2022-07-01 | Discharge: 2022-07-01 | Disposition: A | Discharge status: Home / Self Care | Payer: Medicare HMO | Source: Ambulatory Visit | Attending: Internal Medicine | Admitting: Internal Medicine

## 2022-07-01 ENCOUNTER — Ambulatory Visit (INDEPENDENT_AMBULATORY_CARE_PROVIDER_SITE_OTHER): Payer: Medicare HMO | Admitting: Internal Medicine

## 2022-07-01 ENCOUNTER — Encounter: Payer: Self-pay | Admitting: Internal Medicine

## 2022-07-01 VITALS — BP 138/84 | HR 89 | Temp 98.0°F | Ht 71.0 in | Wt 182.0 lb

## 2022-07-01 DIAGNOSIS — R051 Acute cough: Secondary | ICD-10-CM

## 2022-07-01 DIAGNOSIS — R059 Cough, unspecified: Secondary | ICD-10-CM | POA: Diagnosis not present

## 2022-07-01 MED ORDER — PREDNISONE 20 MG PO TABS: 40.0000 mg | ORAL_TABLET | Freq: Every day | ORAL | 0 refills | Status: DC

## 2022-07-01 MED ORDER — HYDROCODONE BIT-HOMATROP MBR 5-1.5 MG/5ML PO SOLN: 5.0000 mL | Freq: Every evening | ORAL | 0 refills | Status: DC | PRN

## 2022-07-01 NOTE — Assessment & Plan Note (Addendum)
Severe and recalcitrant Still smokes occasionally I am concerned about some chronic lung disease Doxy hasn't really helped Will check CXR--just some scarring at left base  Will try prednisone again-- 40mg  dialy x 3, 20mg  daily x 3 Finish out the doxy Refill the hycodan Must stop all cigarettes  Also, omeprazole daily for now If ongoing symptoms---will refer to pulmonary

## 2022-07-01 NOTE — Progress Notes (Signed)
Subjective:    Patient ID: Justin Terrell, male    DOB: 30-Dec-1955, 67 y.o.   MRN: [id number]  HPI Here with wife due to ongoing cough  Still coughing---"uncontrollably" Feels like something is "stuck"in throat No fever, chills. Some hot flashes Will actually see "rings in my eyes" with the severe cough The hycodan does stop the cough--but only for a little why Some SOB--even sitting  All this was after cruise---about 10  days of symptoms Some frontal headache Tried Navage---helps his nose but not sinuses No ear pain--but some chronic ringing  Tried omeprazole---helped in November--but not recently Similar bad cough/chest pain as then Did have stress test that was generally reassuring  Current Outpatient Medications on File Prior to Visit  Medication Sig Dispense Refill   aspirin EC 81 MG tablet Take 81 mg by mouth daily.     atorvastatin (LIPITOR) 40 MG tablet Take 1 tablet (40 mg total) by mouth daily. 90 tablet 3   cyclobenzaprine (FLEXERIL) 10 MG tablet Take 0.5-1 tablets (5-10 mg total) by mouth at bedtime as needed for muscle spasms. 30 tablet 0   doxycycline (VIBRA-TABS) 100 MG tablet Take 1 tablet (100 mg total) by mouth 2 (two) times daily. 14 tablet 0   HYDROcodone bit-homatropine (HYCODAN) 5-1.5 MG/5ML syrup Take 5 mLs by mouth at bedtime as needed for cough. 120 mL 0   metoprolol succinate (TOPROL-XL) 25 MG 24 hr tablet TAKE 1 TABLET(25 MG) BY MOUTH DAILY 90 tablet 3   valsartan (DIOVAN) 160 MG tablet TAKE 1 TABLET(160 MG) BY MOUTH DAILY 90 tablet 3   No current facility-administered medications on file prior to visit.    Allergies  Allergen Reactions   Codeine Nausea And Vomiting   Pravastatin Other (See Comments)    Reaction:  Chest pain     Past Medical History:  Diagnosis Date   AVM (arteriovenous malformation) of ascending colon 12/25/2012   Cancer (HCC)    Prostate   Coronary atherosclerosis of native coronary artery 05/26/2015   GERD  (gastroesophageal reflux disease)    aspirin sensitive, prn meds only   Hyperlipidemia    Hypertension    Lumbar disc disease    Myocardial infarction [facility])    Nephrolithiasis    Personal history of colonic adenoma 12/30/2012   Ureteral duplication, right     Past Surgical History:  Procedure Laterality Date   ANGIOPLASTY     thigh   CARDIAC CATHETERIZATION N/A 07/05/2014   Procedure: Left Heart Cath and Coronary Angiography;  Surgeon: Marcina Millard, MD;  Location: [facility] INVASIVE CV LAB;  Service: Cardiovascular;  Laterality: N/A;   DOPPLER ECHOCARDIOGRAPHY  06/24/07   stress echo normal. Nl baseline EF   HAND NERVE REPAIR  1970's   both hands   KNEE ARTHROSCOPY  1990's   right    LUMBAR DISC SURGERY  1993   PROSTATE SURGERY  05/2017   robotic prostatectomy   Right leg compound fracture repair  1976    Family History  Problem Relation Age of Onset   Heart disease Father    Hypertension Father    Coronary artery disease Father    Valvular heart disease Father    Cancer Mother        colon cancer   Colon cancer Mother 49   Throat cancer Mother    Esophageal cancer Mother    Dementia Mother    Diabetes Neg Hx    Prostate cancer Neg Hx    Kidney cancer Neg  Hx    Rectal cancer Neg Hx    Stomach cancer Neg Hx     Social History   Socioeconomic History   Marital status: Married    Spouse name: Not on file   Number of children: 0   Years of education: Not on file   Highest education level: Not on file  Occupational History   Occupation: Information systems    Employer: [employer]  Tobacco Use   Smoking status: Former    Packs/day: .1    Types: Cigarettes    Quit date: 02/25/2009    Years since quitting: 13.3    Passive exposure: Past (as a child)   Smokeless tobacco: Never  Vaping Use   Vaping Use: Never used  Substance and Sexual Activity   Alcohol use: No    Alcohol/week: 0.0 standard drinks of alcohol   Drug use: No   Sexual activity: Not on file   Other Topics Concern   Not on file  Social History Narrative   2nd marriage   Wife has 2 children (1 died)   Works in IT      Has living will   Careers information officer, 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  HENT:     Head:     Comments: No sinus tenderness    Mouth/Throat:     Pharynx: No oropharyngeal exudate or posterior oropharyngeal erythema.  Pulmonary:     Effort: Pulmonary effort is normal.     Comments: Mild prolongation of exp phase and scant exp noise (squeaks) Musculoskeletal:     Cervical back: Neck supple.  Lymphadenopathy:     Cervical: No cervical adenopathy.  Neurological:     Mental Status: He is alert.            Assessment & Plan:

## 2022-07-02 ENCOUNTER — Telehealth: Payer: Self-pay | Admitting: Internal Medicine

## 2022-07-02 MED ORDER — BENZONATATE 200 MG PO CAPS: 200.0000 mg | ORAL_CAPSULE | Freq: Three times a day (TID) | ORAL | 0 refills | Status: DC | PRN

## 2022-07-02 NOTE — Telephone Encounter (Signed)
Left message for pt on VM 

## 2022-07-02 NOTE — Telephone Encounter (Signed)
Pt called in stated pharmacy will not fill RX HYDROcodone bit-homatropine (HYCODAN) 5-1.5 MG/5ML syrup  because it's to soon . Wants to know can PCP call in an Alternative . # [phone]

## 2022-07-10 ENCOUNTER — Telehealth: Payer: Self-pay | Admitting: Internal Medicine

## 2022-07-10 DIAGNOSIS — R059 Cough, unspecified: Secondary | ICD-10-CM

## 2022-07-10 NOTE — Telephone Encounter (Signed)
Patient called to ask could referral for pulmonary be sent out for patient.This was discussed with Dr Alphonsus Sias at last visit.Patient  prefers to Dr Vassie Loll .

## 2022-07-11 NOTE — Telephone Encounter (Signed)
Referral placed Let patient know it may not be Dr Alva--but I asked for him

## 2022-07-11 NOTE — Telephone Encounter (Signed)
Called patient reviewed all information and repeated back to me. Will call if any questions.  ? ?

## 2022-08-06 ENCOUNTER — Ambulatory Visit: Payer: Medicare HMO | Admitting: Internal Medicine

## 2022-08-06 ENCOUNTER — Encounter: Payer: Self-pay | Admitting: Internal Medicine

## 2022-08-06 VITALS — BP 128/82 | HR 68 | Temp 97.8°F | Ht 72.0 in | Wt 182.0 lb

## 2022-08-06 DIAGNOSIS — K219 Gastro-esophageal reflux disease without esophagitis: Secondary | ICD-10-CM

## 2022-08-06 DIAGNOSIS — R053 Chronic cough: Secondary | ICD-10-CM

## 2022-08-06 DIAGNOSIS — R0982 Postnasal drip: Secondary | ICD-10-CM | POA: Diagnosis not present

## 2022-08-06 MED ORDER — FLUTICASONE PROPIONATE 50 MCG/ACT NA SUSP
1.0000 | Freq: Every day | NASAL | 3 refills | Status: DC
Start: 2022-08-06 — End: 2022-10-03

## 2022-08-06 MED ORDER — PANTOPRAZOLE SODIUM 40 MG PO TBEC
40.0000 mg | DELAYED_RELEASE_TABLET | Freq: Two times a day (BID) | ORAL | 3 refills | Status: DC
Start: 2022-08-06 — End: 2022-10-03

## 2022-08-06 NOTE — Progress Notes (Signed)
ELEANOR DIMICHELE    [id number]    1955/05/23  Primary Care Physician:Letvak, Berneda Rose, MD  Referring Physician: Karie Schwalbe, MD [address] [place],  [place] [address] Reason for Consultation: chronic cough Date of Consultation: 08/06/2022  Chief complaint:   Chief Complaint  Patient presents with   Consult    Dry coughing episode with chest pain/ burning       HPI: Justin Terrell is a 67 y.o. man who presents for new patient evaluation for chronic cough and chest pain.   Symptoms started in April after cruise - indulged in food and alcohol. He feels like something is collecting in the center of his chest. He coughs it up, then he has burning in his chest pain. He feels rattling inside his chest. He now gets headaches associated with this.   Symptoms are first thing in the morning and also around 7:00pm. Symptoms resolve spontaneously after 4-5 minutes. Symptoms have woken him up at night. Sometimes after eating.  He's been on antibiotics, reflux medication, cough syrup, inhalers. No relief with any of these things.   Last November he went to [facility] for vacation. Then had onset of symptoms for a couple of weeks. Saw his cardiologist and had a reassuring stress test. Was placed on PPI and symptoms resolved.   He's not short of breath, can work in the yard, no fevers, chills, night sweats.   He has not checked his blood pressure during   Tried prilosec OTC 40 mg once daily - took this for a few weeks.   He is also having rhinorrhea and post nasal drainage. Never really had seasonal allergies. Frequent throat clearing. Hoarse voice after talking for a long time.   Social history:  Occupation: retired, worked in Consulting civil engineer for [employer].  Exposures: lives at home with wife. With dog.  Smoking history: smoking on and off. 1/2 ppd. Started smoking at age 81. 25 pack year smoking history.   Social History   Occupational History   Occupation: Pharmacist, hospital: [employer]  Tobacco Use   Smoking status: Some Days    Packs/day: 0.50    Years: 50.00    Additional pack years: 0.00    Total pack years: 25.00    Types: Cigarettes    Last attempt to quit: 02/25/2009    Years since quitting: 13.4    Passive exposure: Past (as a child)   Smokeless tobacco: Never   Tobacco comments:    Less then 1 pack a day [facility] 08/06/22  Vaping Use   Vaping Use: Never used  Substance and Sexual Activity   Alcohol use: No    Alcohol/week: 0.0 standard drinks of alcohol   Drug use: No   Sexual activity: Not on file    Relevant family history:  Family History  Problem Relation Age of Onset   Cancer Mother        colon cancer   Colon cancer Mother 83   Throat cancer Mother    Esophageal cancer Mother    Dementia Mother    Heart disease Father    Hypertension Father    Coronary artery disease Father    Valvular heart disease Father    Diabetes Neg Hx    Prostate cancer Neg Hx    Kidney cancer Neg Hx    Rectal cancer Neg Hx    Stomach cancer Neg Hx    Lung  disease Neg Hx     Past Medical History:  Diagnosis Date   AVM (arteriovenous malformation) of ascending colon 12/25/2012   Cancer (HCC)    Prostate   Coronary atherosclerosis of native coronary artery 05/26/2015   GERD (gastroesophageal reflux disease)    aspirin sensitive, prn meds only   Hyperlipidemia    Hypertension    Lumbar disc disease    Myocardial infarction [facility])    Nephrolithiasis    Personal history of colonic adenoma 12/30/2012   Ureteral duplication, right     Past Surgical History:  Procedure Laterality Date   ANGIOPLASTY     thigh   CARDIAC CATHETERIZATION N/A 07/05/2014   Procedure: Left Heart Cath and Coronary Angiography;  Surgeon: Marcina Millard, MD;  Location: [facility] INVASIVE CV LAB;  Service: Cardiovascular;  Laterality: N/A;   DOPPLER ECHOCARDIOGRAPHY  06/24/07   stress echo normal. Nl baseline EF   HAND NERVE REPAIR  1970's   both hands   KNEE  ARTHROSCOPY  1990's   right    LUMBAR DISC SURGERY  1993   PROSTATE SURGERY  05/2017   robotic prostatectomy   Right leg compound fracture repair  1976     Physical Exam: Blood pressure 128/82, pulse 68, temperature 97.8 F (36.6 C), temperature source Oral, height 6' (1.829 m), weight 182 lb (82.6 kg), SpO2 98 %. Gen:      No acute distress ENT:  +mild nasal debris, no nasal polyps, mucus membranes moist Lungs:    No increased respiratory effort, symmetric chest wall excursion, clear to auscultation bilaterally, no wheezes or crackles CV:         Regular rate and rhythm; no murmurs, rubs, or gallops.  No pedal edema Abd:      + bowel sounds; soft, non-tender; no distension MSK: no acute synovitis of DIP or PIP joints, no mechanics hands.  Skin:      Warm and dry; no rashes Neuro: normal speech, no focal facial asymmetry Psych: alert and oriented x3, normal mood and affect   Data Reviewed/Medical Decision Making:  Independent interpretation of tests: Imaging:  Review of patient's chest xray images May 2024 revealed no acute process, maybe some left lower lobe linear atelectasis. The patient's images have been independently reviewed by me.    PFTs:    Labs:  Lab Results  Component Value Date   NA 140 07/24/2021   K 5.1 07/24/2021   CO2 25 07/24/2021   GLUCOSE 99 07/24/2021   BUN 14 07/24/2021   CREATININE 1.01 07/24/2021   CALCIUM 9.2 07/24/2021   EGFR 83 07/24/2021   GFRNONAA >60 10/04/2020   Lab Results  Component Value Date   WBC 10.0 07/24/2021   HGB 16.0 07/24/2021   HCT 46.0 07/24/2021   MCV 96 07/24/2021   PLT 282 07/24/2021      Immunization status:  Immunization History  Administered Date(s) Administered   Fluad Quad(high Dose 65+) 11/07/2020   Influenza, Seasonal, Injecte, Preservative Fre 12/18/2014   Influenza,inj,Quad PF,6+ Mos 11/07/2018   Influenza-Unspecified 12/14/2017, 11/26/2019   PFIZER(Purple Top)SARS-COV-2 Vaccination 04/04/2019,  04/28/2019, 10/12/2019, 05/14/2020   Pneumococcal Conjugate-13 01/10/2018   Pneumococcal Polysaccharide-23 07/24/2021   Td 07/24/2021   Tdap 10/10/2011   Zoster Recombinat (Shingrix) 01/15/2020, 03/16/2020     I reviewed prior external note(s) from pcp  I reviewed the result(s) of the labs and imaging as noted above.   I have ordered   Assessment:  Chronic cough Post nasal drainage GERD Tobacco use disorder,  ongoing 25 pack years.   Plan/Recommendations:  I think your cough is a combination of reflux and post nasal drainage.  Let's try treating both and getting this under control before backing off.   Start taking nexium 40 mg twice daily. I have listed some common reflux triggers that could be making your symptoms worse.   Flonase - 1 spray on each side of your nose twice a day for first week, then 1 spray on each side.  We discussed disease management and progression at length today.   At risk for COPD - no current symptoms outside these episodes of coughing.    Return to Care: Return in about 2 months (around 10/06/2022).  Durel Salts, MD Pulmonary and Critical Care Medicine [facility] Office:[phone]  CC: Karie Schwalbe, MD

## 2022-08-06 NOTE — Patient Instructions (Signed)
Please schedule follow up scheduled with myself in 2 months.  If my schedule is not open yet, we will contact you with a reminder closer to that time. Please call [phone] if you haven't heard from [place] a month before.   I think your cough is a combination of reflux and post nasal drainage.  Let's try treating both and getting this under control before backing off.   Start taking nexium 40 mg twice daily. I have listed some common reflux triggers that could be making your symptoms worse.   Flonase - 1 spray on each side of your nose twice a day for first week, then 1 spray on each side.   Instructions for use: If you also use a saline nasal spray or rinse, use that first. Position the head with the chin slightly tucked. Use the right hand to spray into the left nostril and the right hand to spray into the left nostril.   Point the bottle away from the septum of your nose (cartilage that divides the two sides of your nose).  Hold the nostril closed on the opposite side from where you will spray Spray once and gently sniff to pull the medicine into the higher parts of your nose.  Don't sniff too hard as the medicine will drain down the back of your throat instead. Repeat with a second spray on the same side if prescribed. Repeat on the other side of your nose.  What is GERD? Gastroesophageal reflux disease (GERD) is gastroesophageal reflux diseasewhich occurs when the lower esophageal sphincter (LES) opens spontaneously, for varying periods of time, or does not close properly and stomach contents rise up into the esophagus. GER is also called acid reflux or acid regurgitation, because digestive juices--called acids--rise up with the food. The esophagus is the tube that carries food from the mouth to the stomach. The LES is a ring of muscle at the bottom of the esophagus that acts like a valve between the esophagus and stomach.  When acid reflux occurs, food or fluid can be tasted in the back of  the mouth. When refluxed stomach acid touches the lining of the esophagus it may cause a burning sensation in the chest or throat called heartburn or acid indigestion. Occasional reflux is common. Persistent reflux that occurs more than twice a week is considered GERD, and it can eventually lead to more serious health problems. People of all ages can have GERD. Studies have shown that GERD may worsen or contribute to asthma, chronic cough, and pulmonary fibrosis.   What are the symptoms of GERD? The main symptom of GERD in adults is frequent heartburn, also called acid indigestion--burning-type pain in the lower part of the mid-chest, behind the breast bone, and in the mid-abdomen.  Not all reflux is acidic in nature, and many patients don't have heart burn at all. Sometimes it feels like a cough (either dry or with mucus), choking sensation, asthma, shortness of breath, waking up at night, frequent throat clearing, or trouble swallowing.    What causes GERD? The reason some people develop GERD is still unclear. However, research shows that in people with GERD, the LES relaxes while the rest of the esophagus is working. Anatomical abnormalities such as a hiatal hernia may also contribute to GERD. A hiatal hernia occurs when the upper part of the stomach and the LES move above the diaphragm, the muscle wall that separates the stomach from the chest. Normally, the diaphragm helps the LES keep acid  from rising up into the esophagus. When a hiatal hernia is present, acid reflux can occur more easily. A hiatal hernia can occur in people of any age and is most often a normal finding in otherwise healthy people over age 79. Most of the time, a hiatal hernia produces no symptoms.   Other factors that may contribute to GERD include - Obesity or recent weight gain - Pregnancy  - Smoking  - Diet - Certain medications  Common foods that can worsen reflux symptoms include: - carbonated beverages - artificial  sweeteners - citrus fruits  - chocolate  - drinks with caffeine or alcohol  - fatty and fried foods  - garlic and onions  - mint flavorings  - spicy foods  - tomato-based foods, like spaghetti sauce, salsa, chili, and pizza   Lifestyle Changes If you smoke, stop.  Avoid foods and beverages that worsen symptoms (see above.) Lose weight if needed.  Eat small, frequent meals.  Wear loose-fitting clothes.  Avoid lying down for 3 hours after a meal.  Raise the head of your bed 6 to 8 inches by securing wood blocks under the bedposts. Just using extra pillows will not help, but using a wedge-shaped pillow may be helpful.  Medications  H2 blockers, such as cimetidine (Tagamet HB), famotidine (Pepcid AC), nizatidine (Axid AR), and ranitidine (Zantac 75), decrease acid production. They are available in prescription strength and over-the-counter strength. These drugs provide short-term relief and are effective for about half of those who have GERD symptoms.  Proton pump inhibitors include omeprazole (Prilosec, Zegerid), lansoprazole (Prevacid), pantoprazole (Protonix), rabeprazole (Aciphex), and esomeprazole (Nexium), which are available by prescription. Prilosec is also available in over-the-counter strength. Proton pump inhibitors are more effective than H2 blockers and can relieve symptoms and heal the esophageal lining in almost everyone who has GERD.  Because drugs work in different ways, combinations of medications may help control symptoms. People who get heartburn after eating may take both antacids and H2 blockers. The antacids work first to neutralize the acid in the stomach, and then the H2 blockers act on acid production. By the time the antacid stops working, the H2 blocker will have stopped acid production. Your health care provider is the best source of information about how to use medications for GERD.   Points to Remember 1. You can have GERD without having heartburn. Your  symptoms could include a dry cough, asthma symptoms, or trouble swallowing.  2. Taking medications daily as prescribed is important in controlling you symptoms.  Sometimes it can take up to 8 weeks to fully achieve the effects of the medications prescribed.  3. Coughing related to GERD can be difficult to treat and is very frustrating!  However, it is important to stick with these medications and lifestyle modifications before pursuing more aggressive or invasive test and treatments.

## 2022-09-01 ENCOUNTER — Other Ambulatory Visit: Payer: Self-pay | Admitting: Internal Medicine

## 2022-09-02 NOTE — Telephone Encounter (Signed)
Patient schedule/d for AWV

## 2022-09-02 NOTE — Telephone Encounter (Signed)
Pt needs Medicare Wellness Exam in the next 90 days. Please help him get scheduled. Thanks.

## 2022-09-10 DIAGNOSIS — D225 Melanocytic nevi of trunk: Secondary | ICD-10-CM | POA: Diagnosis not present

## 2022-09-10 DIAGNOSIS — L814 Other melanin hyperpigmentation: Secondary | ICD-10-CM | POA: Diagnosis not present

## 2022-09-10 DIAGNOSIS — L570 Actinic keratosis: Secondary | ICD-10-CM | POA: Diagnosis not present

## 2022-09-10 DIAGNOSIS — D485 Neoplasm of uncertain behavior of skin: Secondary | ICD-10-CM | POA: Diagnosis not present

## 2022-09-10 DIAGNOSIS — D1801 Hemangioma of skin and subcutaneous tissue: Secondary | ICD-10-CM | POA: Diagnosis not present

## 2022-09-10 DIAGNOSIS — D2262 Melanocytic nevi of left upper limb, including shoulder: Secondary | ICD-10-CM | POA: Diagnosis not present

## 2022-09-10 DIAGNOSIS — L821 Other seborrheic keratosis: Secondary | ICD-10-CM | POA: Diagnosis not present

## 2022-09-10 DIAGNOSIS — D2222 Melanocytic nevi of left ear and external auricular canal: Secondary | ICD-10-CM | POA: Diagnosis not present

## 2022-09-17 ENCOUNTER — Other Ambulatory Visit: Payer: Self-pay

## 2022-09-17 ENCOUNTER — Emergency Department: Payer: Medicare HMO

## 2022-09-17 ENCOUNTER — Inpatient Hospital Stay: Payer: Medicare HMO

## 2022-09-17 ENCOUNTER — Inpatient Hospital Stay
Admission: EM | Admit: 2022-09-17 | Discharge: 2022-09-19 | DRG: 065 | Disposition: A | Discharge status: Home / Self Care | Payer: Medicare HMO | Attending: Internal Medicine | Admitting: Internal Medicine

## 2022-09-17 DIAGNOSIS — Z79899 Other long term (current) drug therapy: Secondary | ICD-10-CM

## 2022-09-17 DIAGNOSIS — R531 Weakness: Secondary | ICD-10-CM | POA: Diagnosis not present

## 2022-09-17 DIAGNOSIS — Z808 Family history of malignant neoplasm of other organs or systems: Secondary | ICD-10-CM | POA: Diagnosis not present

## 2022-09-17 DIAGNOSIS — I10 Essential (primary) hypertension: Secondary | ICD-10-CM

## 2022-09-17 DIAGNOSIS — Z716 Tobacco abuse counseling: Secondary | ICD-10-CM | POA: Diagnosis not present

## 2022-09-17 DIAGNOSIS — Z955 Presence of coronary angioplasty implant and graft: Secondary | ICD-10-CM | POA: Diagnosis not present

## 2022-09-17 DIAGNOSIS — Z8249 Family history of ischemic heart disease and other diseases of the circulatory system: Secondary | ICD-10-CM

## 2022-09-17 DIAGNOSIS — I639 Cerebral infarction, unspecified: Secondary | ICD-10-CM | POA: Diagnosis not present

## 2022-09-17 DIAGNOSIS — K219 Gastro-esophageal reflux disease without esophagitis: Secondary | ICD-10-CM | POA: Diagnosis not present

## 2022-09-17 DIAGNOSIS — E785 Hyperlipidemia, unspecified: Secondary | ICD-10-CM | POA: Diagnosis present

## 2022-09-17 DIAGNOSIS — R079 Chest pain, unspecified: Secondary | ICD-10-CM | POA: Diagnosis present

## 2022-09-17 DIAGNOSIS — I110 Hypertensive heart disease with heart failure: Secondary | ICD-10-CM | POA: Diagnosis present

## 2022-09-17 DIAGNOSIS — R002 Palpitations: Secondary | ICD-10-CM | POA: Diagnosis not present

## 2022-09-17 DIAGNOSIS — I5022 Chronic systolic (congestive) heart failure: Secondary | ICD-10-CM | POA: Insufficient documentation

## 2022-09-17 DIAGNOSIS — Z820 Family history of epilepsy and other diseases of the nervous system: Secondary | ICD-10-CM | POA: Diagnosis not present

## 2022-09-17 DIAGNOSIS — I252 Old myocardial infarction: Secondary | ICD-10-CM

## 2022-09-17 DIAGNOSIS — R9431 Abnormal electrocardiogram [ECG] [EKG]: Secondary | ICD-10-CM | POA: Diagnosis not present

## 2022-09-17 DIAGNOSIS — G459 Transient cerebral ischemic attack, unspecified: Secondary | ICD-10-CM | POA: Diagnosis not present

## 2022-09-17 DIAGNOSIS — Z888 Allergy status to other drugs, medicaments and biological substances status: Secondary | ICD-10-CM

## 2022-09-17 DIAGNOSIS — Z885 Allergy status to narcotic agent status: Secondary | ICD-10-CM

## 2022-09-17 DIAGNOSIS — I2510 Atherosclerotic heart disease of native coronary artery without angina pectoris: Secondary | ICD-10-CM | POA: Diagnosis not present

## 2022-09-17 DIAGNOSIS — H53122 Transient visual loss, left eye: Secondary | ICD-10-CM | POA: Diagnosis not present

## 2022-09-17 DIAGNOSIS — Z7982 Long term (current) use of aspirin: Secondary | ICD-10-CM

## 2022-09-17 DIAGNOSIS — F17210 Nicotine dependence, cigarettes, uncomplicated: Secondary | ICD-10-CM | POA: Diagnosis present

## 2022-09-17 DIAGNOSIS — Z8546 Personal history of malignant neoplasm of prostate: Secondary | ICD-10-CM | POA: Diagnosis not present

## 2022-09-17 DIAGNOSIS — R519 Headache, unspecified: Secondary | ICD-10-CM | POA: Diagnosis present

## 2022-09-17 DIAGNOSIS — Z800 Family history of malignant neoplasm of digestive organs: Secondary | ICD-10-CM | POA: Diagnosis not present

## 2022-09-17 DIAGNOSIS — R29702 NIHSS score 2: Secondary | ICD-10-CM | POA: Diagnosis present

## 2022-09-17 DIAGNOSIS — H5462 Unqualified visual loss, left eye, normal vision right eye: Secondary | ICD-10-CM | POA: Diagnosis not present

## 2022-09-17 DIAGNOSIS — I6521 Occlusion and stenosis of right carotid artery: Secondary | ICD-10-CM | POA: Diagnosis not present

## 2022-09-17 DIAGNOSIS — Z8673 Personal history of transient ischemic attack (TIA), and cerebral infarction without residual deficits: Secondary | ICD-10-CM | POA: Diagnosis present

## 2022-09-17 DIAGNOSIS — H53452 Other localized visual field defect, left eye: Principal | ICD-10-CM

## 2022-09-17 DIAGNOSIS — I63531 Cerebral infarction due to unspecified occlusion or stenosis of right posterior cerebral artery: Principal | ICD-10-CM | POA: Diagnosis present

## 2022-09-17 DIAGNOSIS — I6781 Acute cerebrovascular insufficiency: Secondary | ICD-10-CM | POA: Diagnosis not present

## 2022-09-17 DIAGNOSIS — I5020 Unspecified systolic (congestive) heart failure: Secondary | ICD-10-CM | POA: Insufficient documentation

## 2022-09-17 LAB — CBC
HCT: 47 % (ref 39.0–52.0)
Hemoglobin: 15.8 g/dL (ref 13.0–17.0)
MCH: 31.2 pg (ref 26.0–34.0)
MCHC: 33.6 g/dL (ref 30.0–36.0)
MCV: 92.7 fL (ref 80.0–100.0)
Platelets: 260 10*3/uL (ref 150–400)
RBC: 5.07 MIL/uL (ref 4.22–5.81)
RDW: 13.7 % (ref 11.5–15.5)
WBC: 9.5 10*3/uL (ref 4.0–10.5)
nRBC: 0 % (ref 0.0–0.2)

## 2022-09-17 LAB — HEMOGLOBIN A1C
Hgb A1c MFr Bld: 5.6 % (ref 4.8–5.6)
Mean Plasma Glucose: 114.02 mg/dL

## 2022-09-17 LAB — BASIC METABOLIC PANEL
Anion gap: 7 (ref 5–15)
BUN: 16 mg/dL (ref 8–23)
CO2: 27 mmol/L (ref 22–32)
Calcium: 8.8 mg/dL — ABNORMAL LOW (ref 8.9–10.3)
Chloride: 104 mmol/L (ref 98–111)
Creatinine, Ser: 1.06 mg/dL (ref 0.61–1.24)
GFR, Estimated: >60 mL/min (ref >60)
Glucose, Bld: 107 mg/dL — ABNORMAL HIGH (ref 70–99)
Potassium: 4.1 mmol/L (ref 3.5–5.1)
Sodium: 138 mmol/L (ref 135–145)

## 2022-09-17 LAB — TROPONIN I (HIGH SENSITIVITY)
Troponin I (High Sensitivity): 11 ng/L (ref <18)
Troponin I (High Sensitivity): 11 ng/L (ref <18)

## 2022-09-17 LAB — HIV ANTIBODY (ROUTINE TESTING W REFLEX): HIV Screen 4th Generation wRfx: NONREACTIVE

## 2022-09-17 LAB — PROTIME-INR
INR: 1 (ref 0.8–1.2)
Prothrombin Time: 13.3 seconds (ref 11.4–15.2)

## 2022-09-17 MED ORDER — ASPIRIN 81 MG PO TBEC
81.0000 mg | DELAYED_RELEASE_TABLET | Freq: Every day | ORAL | Status: DC
  Administered 2022-09-17 – 2022-09-19 (×3): 81 mg via ORAL
  Filled 2022-09-17 (×3): qty 1

## 2022-09-17 MED ORDER — IOHEXOL 350 MG/ML SOLN
80.0000 mL | Freq: Once | INTRAVENOUS | Status: AC | PRN
  Administered 2022-09-17: 80 mL via INTRAVENOUS

## 2022-09-17 MED ORDER — MORPHINE SULFATE (PF) 4 MG/ML IV SOLN: 4.0000 mg | INTRAVENOUS | Status: DC | PRN

## 2022-09-17 MED ORDER — PANTOPRAZOLE SODIUM 40 MG PO TBEC
40.0000 mg | DELAYED_RELEASE_TABLET | Freq: Two times a day (BID) | ORAL | Status: DC
  Administered 2022-09-17 – 2022-09-19 (×4): 40 mg via ORAL
  Filled 2022-09-17 (×4): qty 1

## 2022-09-17 MED ORDER — ACETAMINOPHEN 650 MG RE SUPP: 650.0000 mg | RECTAL | Status: DC | PRN

## 2022-09-17 MED ORDER — ACETAMINOPHEN 325 MG PO TABS
650.0000 mg | ORAL_TABLET | ORAL | Status: DC | PRN
  Administered 2022-09-17: 650 mg via ORAL
  Filled 2022-09-17: qty 2

## 2022-09-17 MED ORDER — SODIUM CHLORIDE 0.9 % IV SOLN: INTRAVENOUS | Status: DC

## 2022-09-17 MED ORDER — IPRATROPIUM-ALBUTEROL 0.5-2.5 (3) MG/3ML IN SOLN
3.0000 mL | Freq: Once | RESPIRATORY_TRACT | Status: AC
  Administered 2022-09-17: 3 mL via RESPIRATORY_TRACT
  Filled 2022-09-17: qty 3

## 2022-09-17 MED ORDER — HYDRALAZINE HCL 20 MG/ML IJ SOLN
20.0000 mg | Freq: Once | INTRAMUSCULAR | Status: AC
  Administered 2022-09-17: 20 mg via INTRAVENOUS
  Filled 2022-09-17: qty 1

## 2022-09-17 MED ORDER — MORPHINE SULFATE (PF) 4 MG/ML IV SOLN
4.0000 mg | Freq: Once | INTRAVENOUS | Status: AC
  Administered 2022-09-17: 4 mg via INTRAVENOUS
  Filled 2022-09-17: qty 1

## 2022-09-17 MED ORDER — NICOTINE 21 MG/24HR TD PT24
21.0000 mg | MEDICATED_PATCH | Freq: Every day | TRANSDERMAL | Status: DC
  Filled 2022-09-17: qty 1

## 2022-09-17 MED ORDER — HYDRALAZINE HCL 20 MG/ML IJ SOLN: 10.0000 mg | INTRAMUSCULAR | Status: DC | PRN

## 2022-09-17 MED ORDER — ATORVASTATIN CALCIUM 20 MG PO TABS
80.0000 mg | ORAL_TABLET | Freq: Every evening | ORAL | Status: DC
  Administered 2022-09-17 – 2022-09-18 (×2): 80 mg via ORAL
  Filled 2022-09-17 (×2): qty 4

## 2022-09-17 MED ORDER — HYDROMORPHONE HCL 1 MG/ML IJ SOLN
0.5000 mg | INTRAMUSCULAR | Status: DC | PRN
  Administered 2022-09-17 – 2022-09-18 (×4): 0.5 mg via INTRAVENOUS
  Filled 2022-09-17 (×4): qty 0.5

## 2022-09-17 MED ORDER — STROKE: EARLY STAGES OF RECOVERY BOOK: Freq: Once | Status: AC

## 2022-09-17 MED ORDER — MELATONIN 5 MG PO TABS
5.0000 mg | ORAL_TABLET | Freq: Every evening | ORAL | Status: DC | PRN
  Administered 2022-09-17: 5 mg via ORAL
  Filled 2022-09-17 (×2): qty 1

## 2022-09-17 MED ORDER — ENOXAPARIN SODIUM 40 MG/0.4ML IJ SOSY
40.0000 mg | PREFILLED_SYRINGE | INTRAMUSCULAR | Status: DC
  Administered 2022-09-17 – 2022-09-18 (×2): 40 mg via SUBCUTANEOUS
  Filled 2022-09-17 (×2): qty 0.4

## 2022-09-17 MED ORDER — SENNOSIDES-DOCUSATE SODIUM 8.6-50 MG PO TABS: 1.0000 | ORAL_TABLET | Freq: Every evening | ORAL | Status: DC | PRN

## 2022-09-17 MED ORDER — ACETAMINOPHEN 160 MG/5ML PO SOLN: 650.0000 mg | ORAL | Status: DC | PRN

## 2022-09-17 MED ORDER — CLOPIDOGREL BISULFATE 75 MG PO TABS
300.0000 mg | ORAL_TABLET | Freq: Once | ORAL | Status: AC
  Administered 2022-09-17: 300 mg via ORAL
  Filled 2022-09-17: qty 4

## 2022-09-17 MED ORDER — CLOPIDOGREL BISULFATE 75 MG PO TABS
75.0000 mg | ORAL_TABLET | Freq: Every day | ORAL | Status: DC
  Administered 2022-09-18 – 2022-09-19 (×2): 75 mg via ORAL
  Filled 2022-09-17 (×2): qty 1

## 2022-09-17 MED ORDER — ACETAMINOPHEN 325 MG PO TABS
650.0000 mg | ORAL_TABLET | Freq: Once | ORAL | Status: AC
  Administered 2022-09-17: 650 mg via ORAL
  Filled 2022-09-17: qty 2

## 2022-09-17 MED ORDER — FLUTICASONE PROPIONATE 50 MCG/ACT NA SUSP
1.0000 | Freq: Every day | NASAL | Status: DC
  Administered 2022-09-18 – 2022-09-19 (×2): 1 via NASAL
  Filled 2022-09-17: qty 16

## 2022-09-17 NOTE — H&P (Signed)
History and Physical    Patient: Justin Terrell ZOX:[id number] DOB: [birth year] DOA: 09/17/2022 DOS: the patient was seen and examined on 09/17/2022 PCP: Karie Schwalbe, MD  Patient coming from: Home  Chief Complaint:  Chief Complaint  Patient presents with   Chest Pain   HPI: Justin Terrell is a 67 y.o. male with medical history significant of coronary artery disease status post stenting, GERD, hypertension, hyperlipidemia presenting with CVA and chest pain.  Patient reports approximately 2 days of headache and vision changes.  No focal hemiparesis or confusion.  No dysarthria.  Has had intermittent headaches over the past month or so.  Noted baseline 1 pack/day smoker.  Also noted history of NSTEMI with noted occluded PDA 06/27/18/2016.  Reports intermittent episodes of chest pain over multiple months.  Chest pain abdominally with deep breathing.  Sometimes with movement.  Mild nausea.  No shortness of breath. Presented to the ER afebrile, blood pressures 140s to 200s over 60s to 90s.  White count 9.5, hemoglobin 15.8, platelets 260, creatinine 1.06.  Troponin negative x 2.  EKG normal sinus rhythm with some?  ST depressions in anterior lateral leads.  CT head with noted right occipital infarct.  Neurology consulted for formal evaluation. Review of Systems: As mentioned in the history of present illness. All other systems reviewed and are negative. Past Medical History:  Diagnosis Date   AVM (arteriovenous malformation) of ascending colon 12/25/2012   Cancer (HCC)    Prostate   Coronary atherosclerosis of native coronary artery 05/26/2015   GERD (gastroesophageal reflux disease)    aspirin sensitive, prn meds only   Hyperlipidemia    Hypertension    Lumbar disc disease    Myocardial infarction [facility])    Nephrolithiasis    Personal history of colonic adenoma 12/30/2012   Ureteral duplication, right    Past Surgical History:  Procedure Laterality Date   ANGIOPLASTY     thigh   CARDIAC  CATHETERIZATION N/A 07/05/2014   Procedure: Left Heart Cath and Coronary Angiography;  Surgeon: Marcina Millard, MD;  Location: [facility] INVASIVE CV LAB;  Service: Cardiovascular;  Laterality: N/A;   DOPPLER ECHOCARDIOGRAPHY  06/24/07   stress echo normal. Nl baseline EF   HAND NERVE REPAIR  1970's   both hands   KNEE ARTHROSCOPY  1990's   right    LUMBAR DISC SURGERY  1993   PROSTATE SURGERY  05/2017   robotic prostatectomy   Right leg compound fracture repair  1976   Social History:  reports that he has been smoking cigarettes. He started smoking about 63 years ago. He has a 25 pack-year smoking history. He has been exposed to tobacco smoke. He has never used smokeless tobacco. He reports that he does not drink alcohol and does not use drugs.  Allergies  Allergen Reactions   Codeine Nausea And Vomiting   Pravastatin Other (See Comments)    Reaction:  Chest pain     Family History  Problem Relation Age of Onset   Cancer Mother        colon cancer   Colon cancer Mother 41   Throat cancer Mother    Esophageal cancer Mother    Dementia Mother    Heart disease Father    Hypertension Father    Coronary artery disease Father    Valvular heart disease Father    Diabetes Neg Hx    Prostate cancer Neg Hx    Kidney cancer Neg Hx    Rectal cancer Neg  Hx    Stomach cancer Neg Hx    Lung disease Neg Hx     Prior to Admission medications   Medication Sig Start Date End Date Taking? Authorizing Provider  aspirin EC 81 MG tablet Take 81 mg by mouth daily.   Yes [provider]  fluticasone (FLONASE) 50 MCG/ACT nasal spray Place 1 spray into both nostrils daily. 08/06/22  Yes Charlott Holler, MD  metoprolol succinate (TOPROL-XL) 25 MG 24 hr tablet TAKE 1 TABLET(25 MG) BY MOUTH DAILY 09/02/22  Yes Karie Schwalbe, MD  pantoprazole (PROTONIX) 40 MG tablet Take 1 tablet (40 mg total) by mouth 2 (two) times daily before a meal. 08/06/22  Yes Charlott Holler, MD  valsartan (DIOVAN)  160 MG tablet TAKE 1 TABLET(160 MG) BY MOUTH DAILY 01/28/22  Yes Karie Schwalbe, MD  atorvastatin (LIPITOR) 40 MG tablet Take 1 tablet (40 mg total) by mouth daily. Patient not taking: Reported on 09/17/2022 03/20/18   Karie Schwalbe, MD  cyclobenzaprine (FLEXERIL) 10 MG tablet Take 0.5-1 tablets (5-10 mg total) by mouth at bedtime as needed for muscle spasms. Patient not taking: Reported on 09/17/2022 09/06/20   Copland, Karleen Hampshire, MD  HYDROcodone bit-homatropine (HYCODAN) 5-1.5 MG/5ML syrup Take 5 mLs by mouth at bedtime as needed for cough. Patient not taking: Reported on 09/17/2022 07/01/22   Karie Schwalbe, MD    Physical Exam: Vitals:   09/17/22 0920 09/17/22 1030 09/17/22 1100 09/17/22 1200  BP:  (!) 164/94 (!) 204/90 (!) 148/68  Pulse:  67 68 71  Resp:  18 19 16   Temp:      TempSrc:      SpO2:  100% 100% 100%  Weight: 82.6 kg     Height: 6' (1.829 m)      Physical Exam Constitutional:      Appearance: He is normal weight.  HENT:     Head: Normocephalic and atraumatic.     Nose: Nose normal.     Mouth/Throat:     Mouth: Mucous membranes are moist.  Eyes:     Pupils: Pupils are equal, round, and reactive to light.  Cardiovascular:     Rate and Rhythm: Normal rate and regular rhythm.  Pulmonary:     Effort: Pulmonary effort is normal.  Abdominal:     General: Bowel sounds are normal.  Musculoskeletal:        General: Normal range of motion.  Skin:    General: Skin is warm.  Neurological:     General: No focal deficit present.  Psychiatric:        Mood and Affect: Mood normal.     Data Reviewed:  There are no new results to review at this time. MR BRAIN WO CONTRAST CLINICAL DATA:  Provided history: Neuro deficit, acute, stroke suspected. Additional history provided: Headache. Blurred vision. Weakness.  EXAM: MRI HEAD WITHOUT CONTRAST  TECHNIQUE: Multiplanar, multiecho pulse sequences of the brain and surrounding structures were obtained without  intravenous contrast.  COMPARISON:  Non-contrast head CT and CT angiogram head/neck 09/17/2022.  FINDINGS: Brain:  No age advanced or lobar predominant parenchymal atrophy.  Moderate-sized acute cortical/subcortical right PCA territory infarct within the right occipital lobe and posteromedial right temporal lobe. No significant mass effect. No evidence of hemorrhagic conversion.  There are a few small nonspecific T2 FLAIR hyperintense chronic insults scattered elsewhere in the cerebral white matter.  No evidence of an intracranial mass.  No chronic intracranial blood products.  No extra-axial fluid  collection.  No midline shift.  Vascular: Maintained flow voids within the proximal large arterial vessels.  Skull and upper cervical spine: No focal suspicious marrow lesion.  Sinuses/Orbits: No mass or acute finding within the imaged orbits. Mild mucosal thickening within the bilateral ethmoid sinuses.  Other: Small-volume fluid within the right mastoid air cells.  IMPRESSION: 1. Moderate-sized acute cortical/subcortical right PCA territory infarct within the right occipital lobe and posteromedial right temporal lobe. No significant mass effect. No evidence of hemorrhagic conversion. 2. There are a few small nonspecific T2 FLAIR hyperintense chronic insults scattered elsewhere within the cerebral white matter. 3. Mild mucosal thickening within the bilateral ethmoid sinuses. 4. Small-volume fluid within the right mastoid air cells.  Electronically Signed   By: Jackey Loge D.O.   On: 09/17/2022 14:11 CT ANGIO HEAD NECK W WO CM CLINICAL DATA:  Transient ischemic attack (TIA)  EXAM: CT ANGIOGRAPHY HEAD AND NECK WITH AND WITHOUT CONTRAST  TECHNIQUE: Multidetector CT imaging of the head and neck was performed using the standard protocol during bolus administration of intravenous contrast. Multiplanar CT image reconstructions and MIPs were obtained to evaluate the  vascular anatomy. Carotid stenosis measurements (when applicable) are obtained utilizing NASCET criteria, using the distal internal carotid diameter as the denominator.  RADIATION DOSE REDUCTION: This exam was performed according to the departmental dose-optimization program which includes automated exposure control, adjustment of the mA and/or kV according to patient size and/or use of iterative reconstruction technique.  CONTRAST:  80mL OMNIPAQUE IOHEXOL 350 MG/ML SOLN  COMPARISON:  Same day CT head  FINDINGS: CT HEAD FINDINGS  See same day CT head for intracranial findings  CTA NECK FINDINGS  Aortic arch: Standard branching. Imaged portion shows no evidence of aneurysm or dissection. No significant stenosis of the major arch vessel origins.  Right carotid system: No evidence of dissection, stenosis (50% or greater), or occlusion.  Left carotid system: No evidence of dissection, stenosis (50% or greater), or occlusion.  Vertebral arteries: Codominant. No evidence of dissection, stenosis (50% or greater), or occlusion.  Skeleton: Negative.  Other neck: Negative.  Upper chest: Negative.  Review of the MIP images confirms the above findings  CTA HEAD FINDINGS  Anterior circulation: No significant stenosis, proximal occlusion, aneurysm, or vascular malformation. Moderate narrowing in the cavernous (series 6, image 129). Segment of the right ICA  Posterior circulation: No significant stenosis, proximal occlusion, aneurysm, or vascular malformation.  Venous sinuses: As permitted by contrast timing, patent.  Anatomic variants: Fetal PCA on the left  Review of the MIP images confirms the above findings  IMPRESSION: 1. No intracranial large vessel occlusion. 2. Moderate narrowing in the cavernous segment of the right ICA. 3. No hemodynamically significant stenosis in the neck. 4. See same day CT head for intracranial findings.  Electronically Signed   By:  Lorenza Cambridge M.D.   On: 09/17/2022 13:20 CT HEAD WO CONTRAST ( ) CLINICAL DATA:  Neuro deficit, acute, stroke suspected  EXAM: CT HEAD WITHOUT CONTRAST  TECHNIQUE: Contiguous axial images were obtained from the base of the skull through the vertex without intravenous contrast.  RADIATION DOSE REDUCTION: This exam was performed according to the departmental dose-optimization program which includes automated exposure control, adjustment of the mA and/or kV according to patient size and/or use of iterative reconstruction technique.  COMPARISON:  CT head 10/04/20  FINDINGS: Brain: There is an acute infarct in the right occipital lobe (series 3, image 15). No evidence of hemorrhage, hydrocephalus, extra-axial collection or mass lesion/mass effect.  Vascular: No hyperdense vessel or unexpected calcification.  Skull: Normal. Negative for fracture or focal lesion.  Sinuses/Orbits: No middle ear or mastoid effusion. Paranasal sinuses are clear. Orbits are unremarkable.  Other: None.  IMPRESSION: Acute infarct in the right occipital lobe.  No hemorrhage.  Electronically Signed   By: Lorenza Cambridge M.D.   On: 09/17/2022 10:36 DG Chest 2 View CLINICAL DATA:  Chest pain  EXAM: CHEST - 2 VIEW  COMPARISON:  X-ray 07/01/2022  FINDINGS: Hyperinflation. No consolidation, pneumothorax or effusion. No edema. Normal cardiopericardial silhouette. Mild degenerative changes of the spine on lateral view.  IMPRESSION: No acute cardiopulmonary disease.  Electronically Signed   By: Karen Kays M.D.   On: 09/17/2022 10:18   Lab Results  Component Value Date   WBC 9.5 09/17/2022   HGB 15.8 09/17/2022   HCT 47.0 09/17/2022   MCV 92.7 09/17/2022   PLT 260 09/17/2022   Last metabolic panel Lab Results  Component Value Date   GLUCOSE 107 (H) 09/17/2022   NA 138 09/17/2022   K 4.1 09/17/2022   CL 104 09/17/2022   CO2 27 09/17/2022   BUN 16 09/17/2022   CREATININE 1.06  09/17/2022   GFRNONAA >60 09/17/2022   CALCIUM 8.8 (L) 09/17/2022   PHOS 3.6 09/12/2016   PROT 6.6 07/24/2021   ALBUMIN 4.0 07/24/2021   LABGLOB 2.6 07/24/2021   AGRATIO 1.5 07/24/2021   BILITOT 0.3 07/24/2021   ALKPHOS 76 07/24/2021   AST 20 07/24/2021   ALT 24 07/24/2021   ANIONGAP 7 09/17/2022    Assessment and Plan: * CVA (cerebral vascular accident) (HCC) Positive headache and vision changes x 2 days with noted acute infarct in the right occipital lobe Neurology formally consulted Will plan for formal stroke evaluation including MRI of the brain, CTA of the head neck, 2D echo Risk stratification labs Anticipate DAPT x 21 days Follow   Chest pain Recurrent pleuritic chest pain for several months per patient  Baseline NSTEMI, occluded mid PDA 07/05/2014 Troponin negative x 1 EKG normal sinus rhythm though with some mild ST depressions in anterior lateral leads Will cycle cardiac enzymes overnight Formal consult to cardiology for further evaluation  The does seem to be pleuritic component to symptoms in the setting of tobacco abuse Consider CTA chest to better assess  Follow-up cardiology recommendations.   Tobacco abuse counseling 1 PPD smoker  Discussed cessation at length  Start nicotine patch    GERD (gastroesophageal reflux disease) PPI  Hyperlipidemia High dose statin    Hypertension Allow for permissive hypertension in the setting of CVA As needed IV hydralazine for systolic pressure greater than 220  or diastolic pressure greater than 110      Advance Care Planning:   Code Status: Full Code   Consults: Neurology, cardiology   Family Communication: Wife at the bedside   Severity of Illness: The appropriate patient status for this patient is OBSERVATION. Observation status is judged to be reasonable and necessary in order to provide the required intensity of service to ensure the patient's safety. The patient's presenting symptoms, physical exam  findings, and initial radiographic and laboratory data in the context of their medical condition is felt to place them at decreased risk for further clinical deterioration. Furthermore, it is anticipated that the patient will be medically stable for discharge from the hospital within 2 midnights of admission.   Author: Floydene Flock, MD 09/17/2022 2:27 PM  For on call review [email or web address].

## 2022-09-17 NOTE — Progress Notes (Signed)
OT Cancellation Note  Patient Details Name: Justin Terrell MRN: [id number] DOB: [birth year]   Cancelled Treatment:    Reason Eval/Treat Not Completed: Patient declined, no reason specified. Per PT report, pt with visual deficits but declines OT evaluation in hospital. OT to complete order at this time.  Jackquline Denmark, MS, OTR/L , CBIS ascom [phone]  09/17/22, 3:27 PM

## 2022-09-17 NOTE — Progress Notes (Signed)
SLP Cancellation Note  Patient Details Name: Justin Terrell MRN: [id number] DOB: 1955/08/20   Cancelled treatment:       Reason Eval/Treat Not Completed: SLP screened, no needs identified, will sign off (chart reviewed; consulted NSG then met w/ pt and Wife in room b/f transferring to floor)  Pt denied any difficulty swallowing and is currently on a regular diet; tolerates swallowing pills w/ water per NSG. Pt passed the [facility] swallow screen w/ NSG this morning.  Pt conversed in conversation w/out overt expressive/receptive communication deficits noted; pt denied any speech-language deficits. Speech intelligible, clear.Pt sill c/o min visual deficits in his L eye.  No further skilled ST services indicated as pt appears at his communication baseline. Pt agreed. NSG to reconsult if any change in status while admitted.       Jerilynn Som, MS, CCC-SLP Speech Language Pathologist Rehab Services; [facility] [phone] (ascom) [name] 09/17/2022, 4:28 PM

## 2022-09-17 NOTE — Assessment & Plan Note (Signed)
High-dose statin ?

## 2022-09-17 NOTE — Consult Note (Signed)
NEURO HOSPITALIST CONSULT NOTE   Requestig physician: Dr. Vicente Males  Reason for Consult: Headache with left sided blurred vision   History obtained from:  Patient and Chart     HPI:                                                                                                                                          Justin Terrell is a 67 y.o. male with a PMHx of AVM of the ascending colon, prostate cancer, CAD, GERD, HLD, HTN, smoking, lumbar disc disease s/p lumbar disc surgery and colonic adenoma who presented to [facility] with a c/c of headache and left sided visual blurring with scintillating scotoma involving the temporal visual field of his left eye that started on Monday. He also endorsed a loss of vision in the same region of the scintillating scotoma. His headache is right sided and involves the forehead. He also endorsed some weakness on initial evaluation in Triage, but later denied this during Neurology evaluation. He denies any facial droop, facial numbness, limb numbness, or dizziness. Also with no aphasia, ataxia or difficulty ambulating.   He endorses as well several months of intermittent burning chest pain that radiates outwards, lasting for about 5 minutes at a time and not triggered by any movements or physical activity, usually occurring at rest. He states that these symptoms were diagnosed as GERD, but that medications for this have had no effect on his symptoms.   He has had intermittent headaches over the past month or so. He also endorses having had left sided scintillating scotoma transiently for about 2 hours about a month ago without any residual visual deficit, until the reoccurrence precipitating this admission.   Home medications include ASA and atorvastatin.   CT head revealed a subacute right occipital lobe ischemic infarction.    Past Medical History:  Diagnosis Date   AVM (arteriovenous malformation) of ascending colon 12/25/2012   Cancer  (HCC)    Prostate   Coronary atherosclerosis of native coronary artery 05/26/2015   GERD (gastroesophageal reflux disease)    aspirin sensitive, prn meds only   Hyperlipidemia    Hypertension    Lumbar disc disease    Myocardial infarction [facility])    Nephrolithiasis    Personal history of colonic adenoma 12/30/2012   Ureteral duplication, right     Past Surgical History:  Procedure Laterality Date   ANGIOPLASTY     thigh   CARDIAC CATHETERIZATION N/A 07/05/2014   Procedure: Left Heart Cath and Coronary Angiography;  Surgeon: Marcina Millard, MD;  Location: [facility] INVASIVE CV LAB;  Service: Cardiovascular;  Laterality: N/A;   DOPPLER ECHOCARDIOGRAPHY  06/24/07   stress echo normal. Nl baseline EF   HAND NERVE REPAIR  1970's   both hands  KNEE ARTHROSCOPY  1990's   right    LUMBAR DISC SURGERY  1993   PROSTATE SURGERY  05/2017   robotic prostatectomy   Right leg compound fracture repair  1976    Family History  Problem Relation Age of Onset   Cancer Mother        colon cancer   Colon cancer Mother 76   Throat cancer Mother    Esophageal cancer Mother    Dementia Mother    Heart disease Father    Hypertension Father    Coronary artery disease Father    Valvular heart disease Father    Diabetes Neg Hx    Prostate cancer Neg Hx    Kidney cancer Neg Hx    Rectal cancer Neg Hx    Stomach cancer Neg Hx    Lung disease Neg Hx              Social History:  reports that he has been smoking cigarettes. He started smoking about 63 years ago. He has a 25 pack-year smoking history. He has been exposed to tobacco smoke. He has never used smokeless tobacco. He reports that he does not drink alcohol and does not use drugs.  Allergies  Allergen Reactions   Codeine Nausea And Vomiting   Pravastatin Other (See Comments)    Reaction:  Chest pain     MEDICATIONS:                                                                                                                      Prior to Admission:  Medications Prior to Admission  Medication Sig Dispense Refill Last Dose   aspirin EC 81 MG tablet Take 81 mg by mouth daily.   09/17/2022   fluticasone (FLONASE) 50 MCG/ACT nasal spray Place 1 spray into both nostrils daily. 16 g 3 09/16/2022   metoprolol succinate (TOPROL-XL) 25 MG 24 hr tablet TAKE 1 TABLET(25 MG) BY MOUTH DAILY 90 tablet 0 09/17/2022   pantoprazole (PROTONIX) 40 MG tablet Take 1 tablet (40 mg total) by mouth 2 (two) times daily before a meal. 60 tablet 3 09/17/2022   valsartan (DIOVAN) 160 MG tablet TAKE 1 TABLET(160 MG) BY MOUTH DAILY 90 tablet 3 09/17/2022   atorvastatin (LIPITOR) 40 MG tablet Take 1 tablet (40 mg total) by mouth daily. (Patient not taking: Reported on 09/17/2022) 90 tablet 3 Not Taking   cyclobenzaprine (FLEXERIL) 10 MG tablet Take 0.5-1 tablets (5-10 mg total) by mouth at bedtime as needed for muscle spasms. (Patient not taking: Reported on 09/17/2022) 30 tablet 0 Completed Course   HYDROcodone bit-homatropine (HYCODAN) 5-1.5 MG/5ML syrup Take 5 mLs by mouth at bedtime as needed for cough. (Patient not taking: Reported on 09/17/2022) 120 mL 0 Completed Course   Scheduled:  [START ON 09/18/2022]  stroke: early stages of recovery book   Does not apply Once   aspirin EC  81 mg Oral Daily   atorvastatin  80 mg Oral QPM   [START  ON 09/18/2022] clopidogrel  75 mg Oral Daily   enoxaparin (LOVENOX) injection  40 mg Subcutaneous Q24H   [START ON 09/18/2022] fluticasone  1 spray Each Nare Daily   nicotine  21 mg Transdermal Daily   pantoprazole  40 mg Oral BID AC   Continuous:  sodium chloride 50 mL/hr at 09/17/22 1226     ROS:                                                                                                                                       As per HPI. Does not endorse any additional symptoms at the time of Neurology evaluation.    Blood pressure (!) 148/68, pulse 71, temperature 97.6 F (36.4 C), temperature source Oral,  resp. rate 16, height 6' (1.829 m), weight 82.6 kg, SpO2 100%.   General Examination:                                                                                                       Physical Exam HEENT- [place]/AT   Lungs- Respirations unlabored Extremities- Warm and well-perfused  Neurological Examination Mental Status: Awake and alert. Fully oriented. Thought content appropriate.  Speech fluent without evidence of aphasia.  Able to follow all commands without difficulty. Cranial Nerves: II: Crescentic visual field cut along the periphery of the temporal visual fields of his left eye and nasal visual fields of his right eye. PERRL. III,IV, VI: No ptosis. EOMI. No nystagmus. V: Temp sensation equal bilaterally VII: Smile symmetric VIII: Hearing intact to voice IX,X: No hypophonia or hoarseness XI: Symmetric XII: Midline tongue extension Motor: RUE: 5/5 LUE: 5/5 RLE: 5/5 LLE: 5/5 Sensory: Temp and FT intact x 4. No extinction to DSS. Deep Tendon Reflexes: 2+ and symmetric bilateral biceps, brachioradialis and patellae Plantars: Right: downgoingLeft: Equivocal Cerebellar: No ataxia with FNF bilaterally Gait: Deferred    Lab Results: Basic Metabolic Panel: Recent Labs  Lab 09/17/22 0922  NA 138  K 4.1  CL 104  CO2 27  GLUCOSE 107*  BUN 16  CREATININE 1.06  CALCIUM 8.8*    CBC: Recent Labs  Lab 09/17/22 0922  WBC 9.5  HGB 15.8  HCT 47.0  MCV 92.7  PLT 260    Cardiac Enzymes: No results for input(s): "CKTOTAL", "CKMB", "CKMBINDEX", "TROPONINI" in the last 168 hours.  Lipid Panel: No results for input(s): "CHOL", "TRIG", "HDL", "CHOLHDL", "VLDL", "LDLCALC" in the last 168 hours.  Imaging: CT ANGIO HEAD NECK W WO CM  Result Date: 09/17/2022 CLINICAL DATA:  Transient ischemic attack (TIA) EXAM: CT ANGIOGRAPHY HEAD AND NECK WITH AND WITHOUT CONTRAST TECHNIQUE: Multidetector CT imaging of the head and neck was performed using the standard protocol during  bolus administration of intravenous contrast. Multiplanar CT image reconstructions and MIPs were obtained to evaluate the vascular anatomy. Carotid stenosis measurements (when applicable) are obtained utilizing NASCET criteria, using the distal internal carotid diameter as the denominator. RADIATION DOSE REDUCTION: This exam was performed according to the departmental dose-optimization program which includes automated exposure control, adjustment of the mA and/or kV according to patient size and/or use of iterative reconstruction technique. CONTRAST:  80mL OMNIPAQUE IOHEXOL 350 MG/ML SOLN COMPARISON:  Same day CT head FINDINGS: CT HEAD FINDINGS See same day CT head for intracranial findings CTA NECK FINDINGS Aortic arch: Standard branching. Imaged portion shows no evidence of aneurysm or dissection. No significant stenosis of the major arch vessel origins. Right carotid system: No evidence of dissection, stenosis (50% or greater), or occlusion. Left carotid system: No evidence of dissection, stenosis (50% or greater), or occlusion. Vertebral arteries: Codominant. No evidence of dissection, stenosis (50% or greater), or occlusion. Skeleton: Negative. Other neck: Negative. Upper chest: Negative. Review of the MIP images confirms the above findings CTA HEAD FINDINGS Anterior circulation: No significant stenosis, proximal occlusion, aneurysm, or vascular malformation. Moderate narrowing in the cavernous (series 6, image 129). Segment of the right ICA Posterior circulation: No significant stenosis, proximal occlusion, aneurysm, or vascular malformation. Venous sinuses: As permitted by contrast timing, patent. Anatomic variants: Fetal PCA on the left Review of the MIP images confirms the above findings IMPRESSION: 1. No intracranial large vessel occlusion. 2. Moderate narrowing in the cavernous segment of the right ICA. 3. No hemodynamically significant stenosis in the neck. 4. See same day CT head for intracranial  findings. Electronically Signed   By: Lorenza Cambridge M.D.   On: 09/17/2022 13:20   CT HEAD WO CONTRAST ( )  Result Date: 09/17/2022 CLINICAL DATA:  Neuro deficit, acute, stroke suspected EXAM: CT HEAD WITHOUT CONTRAST TECHNIQUE: Contiguous axial images were obtained from the base of the skull through the vertex without intravenous contrast. RADIATION DOSE REDUCTION: This exam was performed according to the departmental dose-optimization program which includes automated exposure control, adjustment of the mA and/or kV according to patient size and/or use of iterative reconstruction technique. COMPARISON:  CT head 10/04/20 FINDINGS: Brain: There is an acute infarct in the right occipital lobe (series 3, image 15). No evidence of hemorrhage, hydrocephalus, extra-axial collection or mass lesion/mass effect. Vascular: No hyperdense vessel or unexpected calcification. Skull: Normal. Negative for fracture or focal lesion. Sinuses/Orbits: No middle ear or mastoid effusion. Paranasal sinuses are clear. Orbits are unremarkable. Other: None. IMPRESSION: Acute infarct in the right occipital lobe.  No hemorrhage. Electronically Signed   By: Lorenza Cambridge M.D.   On: 09/17/2022 10:36   DG Chest 2 View  Result Date: 09/17/2022 CLINICAL DATA:  Chest pain EXAM: CHEST - 2 VIEW COMPARISON:  X-ray 07/01/2022 FINDINGS: Hyperinflation. No consolidation, pneumothorax or effusion. No edema. Normal cardiopericardial silhouette. Mild degenerative changes of the spine on lateral view. IMPRESSION: No acute cardiopulmonary disease. Electronically Signed   By: Karen Kays M.D.   On: 09/17/2022 10:18     Assessment:  67 y.o. male with a PMHx of AVM of the ascending colon, prostate cancer, CAD, GERD, HLD, HTN, smoking, lumbar disc disease s/p lumbar disc surgery and colonic adenoma who presented to [facility] with a c/c of headache  and left sided visual blurring with scintillating scotoma involving the temporal visual field of his left eye  that started on Monday. He also endorsed a loss of vision in the same region of the scintillating scotoma. His headache is right sided and involves the forehead. He also endorsed some weakness on initial evaluation in Triage, but later denied this during Neurology evaluation. He denies any facial droop, facial numbness, limb numbness, or dizziness. Also with no aphasia, ataxia or difficulty ambulating.  - Exam reveals a crescentic visual field cut along the periphery of the temporal visual fields of his left eye and nasal visual fields of his right eye. No focal sensory loss, weakness or ataxia.  - CT head: An early subacute infarct is seen in the right occipital lobe. No evidence of hemorrhage, hydrocephalus, extra-axial collection or mass lesion/mass effect. - CTA of head and neck: No intracranial large vessel occlusion. Moderate narrowing in the cavernous segment of the right ICA. No hemodynamically significant stenosis in the neck. - Overall impression: Subacute right occipital lobe ischemic stroke. Possible etiologies include vasospasm, cardioembolic stroke and atherosclerotic disease.   Recommendations: - Smoking cessation.  - HgbA1c, fasting lipid panel - MRI of the brain without contrast - PT consult, OT consult, Speech consult - TTE - Cardiology consult for intermittent chest pain symptoms in the context of a history of CAD - Agree with adding Plavix to ASA. - Continue atorvastatin - Telemetry monitoring - Frequent neuro checks - NPO until passes stroke swallow screen  Electronically signed: Dr. Caryl Pina 09/17/2022, 1:26 PM

## 2022-09-17 NOTE — Assessment & Plan Note (Addendum)
Recurrent pleuritic chest pain for several months per patient  Hx of prior NSTEMI, occluded mid PDA 07/05/2014.  Pt notes character of this pain is different. Troponins trended and negative. Some mild ST depressions in anterior lateral leads potentially related to stroke.  --Cardiology consulted - see recs --on DAPT given stroke while on ASA monotherapy --On high-intensity statin --Stat EKG and troponin for acute chest pain

## 2022-09-17 NOTE — Evaluation (Signed)
Physical Therapy Evaluation Patient Details Name: Justin Terrell MRN: [id number] DOB: 10-02-1955 Today's Date: 09/17/2022  History of Present Illness  Pt is a 67 y.o. male with medical history significant of coronary artery disease status post stenting, GERD, hypertension, hyperlipidemia presenting with CVA and chest pain   Clinical Impression  Pt was pleasant and motivated to participate during the session and put forth good effort throughout. Pt presented with no notable deficits in BUE or BLE strength, sensation to light touch, proprioception, or coordiantion other than chronic sensation deficits in L foot that per pt presented after a back surgery.  Pt was Ind wth all functional tasks and reported feeling at his baseline except for a HA and some visual deficits in his L eye that seemed central in nature.  Peripheral vision WNL and equal L/R, VOR WNL, tracking WNL, but pt had some difficulty identifying objects in central visual field of his L eye only, MD notified.  No skilled PT needs identified at this time.  Will complete PT orders at this time but will reassess pt pending a change in status upon receipt of new PT orders.            Assistance Recommended at Discharge None  If plan is discharge home, recommend the following:  Can travel by private vehicle  Assist for transportation        Equipment Recommendations None recommended by PT  Recommendations for Other Services       Functional Status Assessment Patient has not had a recent decline in their functional status     Precautions / Restrictions Precautions Precautions: None Restrictions Weight Bearing Restrictions: No      Mobility  Bed Mobility Overal bed mobility: Independent                  Transfers Overall transfer level: Independent                 General transfer comment: Good control and stability without the need of UE assist    Ambulation/Gait Ambulation/Gait assistance:  Independent Gait Distance (Feet): 200 Feet Assistive device: None Gait Pattern/deviations: WFL(Within Functional Limits) Gait velocity: WNL     General Gait Details: Good stability including during start/stops and change of direction  Stairs            Wheelchair Mobility     Tilt Bed    Modified Rankin (Stroke Patients Only)       Balance Overall balance assessment: Independent                                           Pertinent Vitals/Pain Pain Assessment Pain Assessment: 0-10 Pain Score: 5  Pain Location: HA Pain Descriptors / Indicators: Headache Pain Intervention(s): Premedicated before session, Monitored during session    Home Living Family/patient expects to be discharged to:: Private residence Living Arrangements: Spouse/significant other Available Help at Discharge: Family;Available 24 hours/day Type of Home: House Home Access: Stairs to enter Entrance Stairs-Rails: Left Entrance Stairs-Number of Steps: 4   Home Layout: Two level;Able to live on main level with bedroom/bathroom Home Equipment: None      Prior Function Prior Level of Function : Independent/Modified Independent             Mobility Comments: Ind amb community distances without an AD, no fall history ADLs Comments: Ind with ADLs  Hand Dominance   Dominant Hand: Right    Extremity/Trunk Assessment   Upper Extremity Assessment Upper Extremity Assessment: Overall WFL for tasks assessed (BUE strength, sensation to light touch, proprioception, and coordiantion WNL)    Lower Extremity Assessment Lower Extremity Assessment: Overall WFL for tasks assessed (BLE strength, sensation to light touch, proprioception, and coordiantion WNL except for chronic deficit to light touch in the L foot)       Communication   Communication: No difficulties  Cognition Arousal/Alertness: Awake/alert Behavior During Therapy: WFL for tasks assessed/performed Overall  Cognitive Status: Within Functional Limits for tasks assessed                                          General Comments General comments (skin integrity, edema, etc.): Pt steady with feet together with eyes open and closed, able to perform BLE SLS 10 sec on RLE and 4 sec on LLE (pt reported chronic loss of sensation to light touch on bottom of L foot)    Exercises     Assessment/Plan    PT Assessment Patient does not need any further PT services  PT Problem List         PT Treatment Interventions      PT Goals (Current goals can be found in the Care Plan section)  Acute Rehab PT Goals PT Goal Formulation: All assessment and education complete, DC therapy    Frequency       Co-evaluation               AM-PAC PT "6 Clicks" Mobility  Outcome Measure Help needed turning from your back to your side while in a flat bed without using bedrails?: None Help needed moving from lying on your back to sitting on the side of a flat bed without using bedrails?: None Help needed moving to and from a bed to a chair (including a wheelchair)?: None Help needed standing up from a chair using your arms (e.g., wheelchair or bedside chair)?: None Help needed to walk in hospital room?: None Help needed climbing 3-5 steps with a railing? : None 6 Click Score: 24    End of Session Equipment Utilized During Treatment: Gait belt Activity Tolerance: Patient tolerated treatment well Patient left: in bed;with family/visitor present;with call bell/phone within reach Nurse Communication: Mobility status PT Visit Diagnosis: Muscle weakness (generalized) (M62.81)    Time: 0981-1914 PT Time Calculation (min) (ACUTE ONLY): 20 min   Charges:   PT Evaluation $PT Eval Low Complexity: 1 Low   PT General Charges $$ ACUTE PT VISIT: 1 Visit       D. Elly Modena PT, DPT 09/17/22, 3:42 PM

## 2022-09-17 NOTE — Plan of Care (Signed)
  Problem: Education: Goal: Knowledge of disease or condition will improve Outcome: Progressing Goal: Knowledge of secondary prevention will improve (MUST DOCUMENT ALL) Outcome: Progressing Goal: Knowledge of patient specific risk factors will improve (Mark N/A or DELETE if not current risk factor) Outcome: Progressing   

## 2022-09-17 NOTE — Assessment & Plan Note (Signed)
PPI ?

## 2022-09-17 NOTE — Assessment & Plan Note (Signed)
1 PPD smoker  Discussed cessation at length  Start nicotine patch

## 2022-09-17 NOTE — Assessment & Plan Note (Signed)
Pt presented with headache and vision changes x 2 days with noted acute infarct in the right occipital lobe on MRI brain. --Neurology consulted --Remainder of stroke evaluation completed and unremarkable --Continue DAPT with ASA/Plavix indefinitely --High dose Lipitor --Resume antihypertensives, now out of permissive HTN window --Smoking cessation --Evaluation with DMV OT prior to resume driving

## 2022-09-17 NOTE — Assessment & Plan Note (Addendum)
Resume metoprolol ARB resume tomorrow PRN hydralazine if SBP > 160 Now outside permissive HTN window.

## 2022-09-17 NOTE — Assessment & Plan Note (Signed)
2D echo November 2023 with EF 45 to 50%.  Echo this admission normal EF with grade I diastolic dysfunction. Appears euvolemic, compensated. Monitor volume status

## 2022-09-17 NOTE — ED Triage Notes (Signed)
Pt here with cp since April but states it is getting worse in nature. Pt also having a headache and left side blurred vision that started yesterday. Pt states he is missing a part of his vision, headache is right sided. Pt also endorsing weakness.

## 2022-09-17 NOTE — ED Provider Notes (Signed)
[facility] Provider Note   Event Date/Time   First MD Initiated Contact with Patient 09/17/22 1038     (approximate) History  Chest Pain  HPI Justin Terrell is a 67 y.o. male with a stated past medical history of hypertension, hyperlipidemia, and tobacco abuse sounds complaining of intermittent chest pain as well as left-sided vision loss since yesterday.  Patient states that he had 1 episode similar to this in which his vision was blurry a few weeks ago that resolved spontaneously and he was never seen or evaluated for.  Patient states that yesterday evening he began having worsening vision on the left side of his visual fields that have become increasingly blurry since that time.  Patient also endorses burning, epigastric and substernal chest pain that occasionally radiates to both arms.  Patient denies any exacerbating or relieving factors for either complaint ROS: Patient currently denies any tinnitus, difficulty speaking, facial droop, sore throat, shortness of breath, abdominal pain, nausea/vomiting/diarrhea, dysuria, or weakness/numbness/paresthesias in any extremity   Physical Exam  Triage Vital Signs: ED Triage Vitals  Encounter Vitals Group     BP 09/17/22 0919 (!) 184/99     Systolic BP Percentile --      Diastolic BP Percentile --      Pulse Rate 09/17/22 0919 81     Resp 09/17/22 0919 18     Temp 09/17/22 0919 97.6 F (36.4 C)     Temp Source 09/17/22 0919 Oral     SpO2 09/17/22 0919 96 %     Weight 09/17/22 0920 182 lb 1.6 oz (82.6 kg)     Height 09/17/22 0920 6' (1.829 m)     Head Circumference --      Peak Flow --      Pain Score 09/17/22 0920 8     Pain Loc --      Pain Education --      Exclude from Growth Chart --    Most recent vital signs: Vitals:   09/17/22 1500 09/17/22 1505  BP:    Pulse: 72 75  Resp: 13 12  Temp:    SpO2: 100% 98%   General: Awake, oriented x4. CV:  Good peripheral perfusion.  Resp:  Normal effort.   Abd:  No distention.  Other:  Elderly well-developed, well-nourished Caucasian male laying in bed in no acute distress.  Temporal visual fields intact ED Results / Procedures / Treatments  Labs (all labs ordered are listed, but only abnormal results are displayed) Labs Reviewed  BASIC METABOLIC PANEL - Abnormal; Notable for the following components:      Result Value   Glucose, Bld 107 (*)    Calcium 8.8 (*)    All other components within normal limits  CBC  PROTIME-INR  HIV ANTIBODY (ROUTINE TESTING W REFLEX)  HEMOGLOBIN A1C  TROPONIN I (HIGH SENSITIVITY)  TROPONIN I (HIGH SENSITIVITY)   EKG ED ECG REPORT I, Merwyn Katos, the attending physician, personally viewed and interpreted this ECG. Date: 09/17/2022 EKG Time: 0925 Rate: 85 Rhythm: normal sinus rhythm QRS Axis: normal Intervals: normal ST/T Wave abnormalities: normal Narrative Interpretation: no evidence of acute ischemia RADIOLOGY ED MD interpretation: Noncontrast CT of the head interpreted independently by me and shows an acute infarct in the right occipital lobe  2 view chest x-ray interpreted by me shows no evidence of acute abnormalities including no pneumonia, pneumothorax, or widened mediastinum -Agree with radiology assessment Official radiology report(s): MR BRAIN WO CONTRAST  Result  Date: 09/17/2022 CLINICAL DATA:  Provided history: Neuro deficit, acute, stroke suspected. Additional history provided: Headache. Blurred vision. Weakness. EXAM: MRI HEAD WITHOUT CONTRAST TECHNIQUE: Multiplanar, multiecho pulse sequences of the brain and surrounding structures were obtained without intravenous contrast. COMPARISON:  Non-contrast head CT and CT angiogram head/neck 09/17/2022. FINDINGS: Brain: No age advanced or lobar predominant parenchymal atrophy. Moderate-sized acute cortical/subcortical right PCA territory infarct within the right occipital lobe and posteromedial right temporal lobe. No significant mass effect.  No evidence of hemorrhagic conversion. There are a few small nonspecific T2 FLAIR hyperintense chronic insults scattered elsewhere in the cerebral white matter. No evidence of an intracranial mass. No chronic intracranial blood products. No extra-axial fluid collection. No midline shift. Vascular: Maintained flow voids within the proximal large arterial vessels. Skull and upper cervical spine: No focal suspicious marrow lesion. Sinuses/Orbits: No mass or acute finding within the imaged orbits. Mild mucosal thickening within the bilateral ethmoid sinuses. Other: Small-volume fluid within the right mastoid air cells. IMPRESSION: 1. Moderate-sized acute cortical/subcortical right PCA territory infarct within the right occipital lobe and posteromedial right temporal lobe. No significant mass effect. No evidence of hemorrhagic conversion. 2. There are a few small nonspecific T2 FLAIR hyperintense chronic insults scattered elsewhere within the cerebral white matter. 3. Mild mucosal thickening within the bilateral ethmoid sinuses. 4. Small-volume fluid within the right mastoid air cells. Electronically Signed   By: Jackey Loge D.O.   On: 09/17/2022 14:11   CT ANGIO HEAD NECK W WO CM  Result Date: 09/17/2022 CLINICAL DATA:  Transient ischemic attack (TIA) EXAM: CT ANGIOGRAPHY HEAD AND NECK WITH AND WITHOUT CONTRAST TECHNIQUE: Multidetector CT imaging of the head and neck was performed using the standard protocol during bolus administration of intravenous contrast. Multiplanar CT image reconstructions and MIPs were obtained to evaluate the vascular anatomy. Carotid stenosis measurements (when applicable) are obtained utilizing NASCET criteria, using the distal internal carotid diameter as the denominator. RADIATION DOSE REDUCTION: This exam was performed according to the departmental dose-optimization program which includes automated exposure control, adjustment of the mA and/or kV according to patient size and/or use  of iterative reconstruction technique. CONTRAST:  80mL OMNIPAQUE IOHEXOL 350 MG/ML SOLN COMPARISON:  Same day CT head FINDINGS: CT HEAD FINDINGS See same day CT head for intracranial findings CTA NECK FINDINGS Aortic arch: Standard branching. Imaged portion shows no evidence of aneurysm or dissection. No significant stenosis of the major arch vessel origins. Right carotid system: No evidence of dissection, stenosis (50% or greater), or occlusion. Left carotid system: No evidence of dissection, stenosis (50% or greater), or occlusion. Vertebral arteries: Codominant. No evidence of dissection, stenosis (50% or greater), or occlusion. Skeleton: Negative. Other neck: Negative. Upper chest: Negative. Review of the MIP images confirms the above findings CTA HEAD FINDINGS Anterior circulation: No significant stenosis, proximal occlusion, aneurysm, or vascular malformation. Moderate narrowing in the cavernous (series 6, image 129). Segment of the right ICA Posterior circulation: No significant stenosis, proximal occlusion, aneurysm, or vascular malformation. Venous sinuses: As permitted by contrast timing, patent. Anatomic variants: Fetal PCA on the left Review of the MIP images confirms the above findings IMPRESSION: 1. No intracranial large vessel occlusion. 2. Moderate narrowing in the cavernous segment of the right ICA. 3. No hemodynamically significant stenosis in the neck. 4. See same day CT head for intracranial findings. Electronically Signed   By: Lorenza Cambridge M.D.   On: 09/17/2022 13:20   CT HEAD WO CONTRAST ( )  Result Date: 09/17/2022 CLINICAL DATA:  Neuro deficit, acute, stroke suspected EXAM: CT HEAD WITHOUT CONTRAST TECHNIQUE: Contiguous axial images were obtained from the base of the skull through the vertex without intravenous contrast. RADIATION DOSE REDUCTION: This exam was performed according to the departmental dose-optimization program which includes automated exposure control, adjustment of the  mA and/or kV according to patient size and/or use of iterative reconstruction technique. COMPARISON:  CT head 10/04/20 FINDINGS: Brain: There is an acute infarct in the right occipital lobe (series 3, image 15). No evidence of hemorrhage, hydrocephalus, extra-axial collection or mass lesion/mass effect. Vascular: No hyperdense vessel or unexpected calcification. Skull: Normal. Negative for fracture or focal lesion. Sinuses/Orbits: No middle ear or mastoid effusion. Paranasal sinuses are clear. Orbits are unremarkable. Other: None. IMPRESSION: Acute infarct in the right occipital lobe.  No hemorrhage. Electronically Signed   By: Lorenza Cambridge M.D.   On: 09/17/2022 10:36   DG Chest 2 View  Result Date: 09/17/2022 CLINICAL DATA:  Chest pain EXAM: CHEST - 2 VIEW COMPARISON:  X-ray 07/01/2022 FINDINGS: Hyperinflation. No consolidation, pneumothorax or effusion. No edema. Normal cardiopericardial silhouette. Mild degenerative changes of the spine on lateral view. IMPRESSION: No acute cardiopulmonary disease. Electronically Signed   By: Karen Kays M.D.   On: 09/17/2022 10:18   PROCEDURES: Critical Care performed: No .1-3 Lead EKG Interpretation  Performed by: Merwyn Katos, MD Authorized by: Merwyn Katos, MD     Interpretation: normal     ECG rate:  71   ECG rate assessment: normal     Rhythm: sinus rhythm     Ectopy: none     Conduction: normal    MEDICATIONS ORDERED IN ED: Medications   stroke: early stages of recovery book (has no administration in time range)  0.9 %  sodium chloride infusion ( Intravenous New Bag/Given 09/17/22 1226)  acetaminophen (TYLENOL) tablet 650 mg (has no administration in time range)    Or  acetaminophen (TYLENOL) 160 MG/5ML solution 650 mg (has no administration in time range)    Or  acetaminophen (TYLENOL) suppository 650 mg (has no administration in time range)  senna-docusate (Senokot-S) tablet 1 tablet (has no administration in time range)  enoxaparin  (LOVENOX) injection 40 mg (has no administration in time range)  nicotine (NICODERM CQ - dosed in mg/24 hours) patch 21 mg (0 mg Transdermal Hold 09/17/22 1459)  pantoprazole (PROTONIX) EC tablet 40 mg (has no administration in time range)  fluticasone (FLONASE) 50 MCG/ACT nasal spray 1 spray (has no administration in time range)  clopidogrel (PLAVIX) tablet 300 mg (300 mg Oral Given 09/17/22 1051)  ipratropium-albuterol (DUONEB) 0.5-2.5 (3) MG/3ML nebulizer solution 3 mL (3 mLs Nebulization Given 09/17/22 1148)  acetaminophen (TYLENOL) tablet 650 mg (650 mg Oral Given 09/17/22 1148)  hydrALAZINE (APRESOLINE) injection 20 mg (20 mg Intravenous Given 09/17/22 1151)  iohexol (OMNIPAQUE) 350 MG/ML injection 80 mL (80 mLs Intravenous Contrast Given 09/17/22 1229)   IMPRESSION / MDM / ASSESSMENT AND PLAN / ED COURSE  I reviewed the triage vital signs and the nursing notes.                             The patient is on the cardiac monitor to evaluate for evidence of arrhythmia and/or significant heart rate changes. Patient's presentation is most consistent with acute presentation with potential threat to life or bodily function. 67 year old male presents for chest pain and left-sided vision loss PMH risk factors: CAD, hypertension, hyperlipidemia, tobacco abuse  Neurologic Deficits: Left-sided vision loss Last known Well Time: 09/16/22 NIH Stroke Score: 1 Given History and Exam I have lower suspicion for infectious etiology, neurologic changes secondary to toxicologic ingestion, seizure, complex migraine. Presentation concerning for possible stroke requiring workup.  Workup: Labs: POC glucose, CBC, BMP, LFTs, Troponin, PT/INR, PTT, Type and Screen Other Diagnostics: ECG, CXR, non-contrast head CT followed by CTA brain and neck (to r/o large vessel occlusion amenable to thrombectomy) Interventions: Patient low on NIH scale and out of the window for tpa.  Consult: Neurology. Discussed with Dr. Otelia Limes  regarding patients neurological symptoms and last well-known time and eligibility for TPA criteria. Disposition: Admission to medicine   FINAL CLINICAL IMPRESSION(S) / ED DIAGNOSES   Final diagnoses:  Peripheral vision loss, left  Cerebrovascular accident (CVA), unspecified mechanism (HCC)   Rx / DC Orders   ED Discharge Orders     None      Note:  This document was prepared using Dragon voice recognition software and may include unintentional dictation errors.   Merwyn Katos, MD 09/17/22 [phone]

## 2022-09-18 ENCOUNTER — Inpatient Hospital Stay
Admit: 2022-09-18 | Discharge: 2022-09-18 | Disposition: A | Discharge status: Home / Self Care | Payer: Medicare HMO | Attending: Internal Medicine | Admitting: Internal Medicine

## 2022-09-18 DIAGNOSIS — R519 Headache, unspecified: Secondary | ICD-10-CM | POA: Diagnosis not present

## 2022-09-18 DIAGNOSIS — R002 Palpitations: Secondary | ICD-10-CM | POA: Diagnosis not present

## 2022-09-18 DIAGNOSIS — R079 Chest pain, unspecified: Secondary | ICD-10-CM

## 2022-09-18 DIAGNOSIS — I639 Cerebral infarction, unspecified: Secondary | ICD-10-CM

## 2022-09-18 LAB — LIPID PANEL
Cholesterol: 194 mg/dL (ref 0–200)
HDL: 43 mg/dL (ref >40)
LDL Cholesterol: 134 mg/dL — ABNORMAL HIGH (ref 0–99)
Total CHOL/HDL Ratio: 4.5 RATIO
Triglycerides: 83 mg/dL (ref <150)
VLDL: 17 mg/dL (ref 0–40)

## 2022-09-18 LAB — ECHOCARDIOGRAM COMPLETE
AR max vel: 2.42 cm2
AV Area VTI: 2.54 cm2
AV Area mean vel: 2.2 cm2
AV Mean grad: 3 mmHg
AV Peak grad: 7.2 mmHg
Ao pk vel: 1.34 m/s
Area-P 1/2: 2.22 cm2
Calc EF: 56.4 %
Height: 72 in
MV VTI: 2.17 cm2
S' Lateral: 3.3 cm
Single Plane A2C EF: 50.6 %
Single Plane A4C EF: 62 %
Weight: 2913.6 oz

## 2022-09-18 LAB — BASIC METABOLIC PANEL
Anion gap: 6 (ref 5–15)
BUN: 15 mg/dL (ref 8–23)
CO2: 22 mmol/L (ref 22–32)
Calcium: 8.4 mg/dL — ABNORMAL LOW (ref 8.9–10.3)
Chloride: 107 mmol/L (ref 98–111)
Creatinine, Ser: 0.91 mg/dL (ref 0.61–1.24)
GFR, Estimated: >60 mL/min (ref >60)
Glucose, Bld: 119 mg/dL — ABNORMAL HIGH (ref 70–99)
Potassium: 3.8 mmol/L (ref 3.5–5.1)
Sodium: 135 mmol/L (ref 135–145)

## 2022-09-18 LAB — GLUCOSE, CAPILLARY: Glucose-Capillary: 92 mg/dL (ref 70–99)

## 2022-09-18 LAB — TROPONIN I (HIGH SENSITIVITY)
Troponin I (High Sensitivity): 9 ng/L (ref <18)
Troponin I (High Sensitivity): 10 ng/L (ref <18)

## 2022-09-18 LAB — MAGNESIUM: Magnesium: 2.1 mg/dL (ref 1.7–2.4)

## 2022-09-18 MED ORDER — MAGNESIUM SULFATE 2 GM/50ML IV SOLN
2.0000 g | Freq: Once | INTRAVENOUS | Status: AC
  Administered 2022-09-18: 2 g via INTRAVENOUS
  Filled 2022-09-18: qty 50

## 2022-09-18 MED ORDER — METOPROLOL TARTRATE 25 MG PO TABS: 25.0000 mg | ORAL_TABLET | Freq: Two times a day (BID) | ORAL | 1 refills | Status: AC

## 2022-09-18 MED ORDER — MAGNESIUM GLYCINATE 100 MG PO CAPS: 100.0000 mg | ORAL_CAPSULE | Freq: Every day | ORAL | Status: DC

## 2022-09-18 MED ORDER — METOPROLOL SUCCINATE ER 25 MG PO TB24: 25.0000 mg | ORAL_TABLET | Freq: Every day | ORAL | Status: DC

## 2022-09-18 MED ORDER — BUTALBITAL-APAP-CAFFEINE 50-325-40 MG PO TABS: 1.0000 | ORAL_TABLET | Freq: Four times a day (QID) | ORAL | 0 refills | Status: DC | PRN

## 2022-09-18 MED ORDER — BUTALBITAL-APAP-CAFFEINE 50-325-40 MG PO TABS
1.0000 | ORAL_TABLET | Freq: Four times a day (QID) | ORAL | Status: DC | PRN
  Administered 2022-09-18: 1 via ORAL
  Filled 2022-09-18: qty 1

## 2022-09-18 MED ORDER — HYDRALAZINE HCL 50 MG PO TABS: 25.0000 mg | ORAL_TABLET | Freq: Four times a day (QID) | ORAL | Status: DC | PRN

## 2022-09-18 MED ORDER — CLOPIDOGREL BISULFATE 75 MG PO TABS: 75.0000 mg | ORAL_TABLET | Freq: Every day | ORAL | 2 refills | Status: AC

## 2022-09-18 MED ORDER — METOPROLOL TARTRATE 25 MG PO TABS
25.0000 mg | ORAL_TABLET | Freq: Two times a day (BID) | ORAL | Status: DC
  Administered 2022-09-18 – 2022-09-19 (×3): 25 mg via ORAL
  Filled 2022-09-18 (×3): qty 1

## 2022-09-18 MED ORDER — ATORVASTATIN CALCIUM 80 MG PO TABS: 80.0000 mg | ORAL_TABLET | Freq: Every evening | ORAL | 2 refills | Status: DC
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
## 2022-09-18 NOTE — Plan of Care (Signed)
  Problem: Education: Goal: Knowledge of disease or condition will improve Outcome: Progressing Goal: Knowledge of secondary prevention will improve (MUST DOCUMENT ALL) Outcome: Progressing Goal: Knowledge of patient specific risk factors will improve Elta [place] N/A or DELETE if not current risk factor) Outcome: Progressing   Problem: Ischemic Stroke/TIA Tissue Perfusion: Goal: Complications of ischemic stroke/TIA will be minimized Outcome: Progressing   Problem: Coping: Goal: Will verbalize positive feelings about self Outcome: Progressing

## 2022-09-18 NOTE — TOC CM/SW Note (Signed)
Transition of Care [facility]) - Inpatient Brief Assessment   Patient Details  Name: TERRI MALERBA MRN: [id number] Date of Birth: 26-Jan-1956  [facility]) CM/SW Contact:    Allena Katz, LCSW Phone Number: 09/18/2022, 8:40 AM   Clinical Narrative:    Transition of Care Asessment: Insurance and Status: Insurance coverage has been reviewed Patient has primary care physician: Yes Home environment has been reviewed: [address] [place] [address]- Prior level of function:: no PT/OT needs. Prior/Current Home Services: No current home services Social Determinants of Health Reivew: SDOH reviewed no interventions necessary Readmission risk has been reviewed: Yes Transition of care needs: no transition of care needs at this time

## 2022-09-18 NOTE — Consult Note (Signed)
[facility] CLINIC CARDIOLOGY CONSULT NOTE       Patient ID: Justin Terrell MRN: [id number] DOB/AGE: 67-Jun-1957 67 y.o.  Admit date: 09/17/2022 Referring Physician Dr. Doree Albee Primary Physician Dr. Tillman Abide Primary Cardiologist Dr. Darrold Junker Reason for Consultation chest pain   HPI: Justin Terrell is a 67yoM with a PMH of CAD (NSTEMI 07/05/14 - occluded mid PDA, medically managed), HFpEF (50%, g1DD 12/2021), HTN, HLD, ascending colon AVM, hx prostate cancer, ongoing tobacco abuse, who presented to [facility] ED 09/17/2022 with left-sided vision loss and blurriness, and with intermittent chest pain.  MRI brain revealed moderate acute cortical/subcortical right PCA infarct. Cardiology is consulted for further evaluation of his chest discomfort.  History is obtained from the patient and his wife at bedside.  On Monday evening the patient complained of a headache with blurry vision of his left eye.  Presented to [facility] where head CT revealed a subacute right occipital lobe ischemic infarction, confirmed by MRI as a moderate acute cortical/subcortical right PCA infarct without hemorrhagic conversion.  He was started on DAPT with aspirin and Plavix, and high intensity statin.    He also notes intermittent chest pain ongoing for the past several months which has been attributed to GERD/pleuritic discomfort historically.  We discussed extensively his intermittent chest discomfort which he notably states is different in character than when he had his prior NSTEMI and LHC (substernal pressure).  He describes his more recent chest discomfort as "burning" and sometimes takes his breath away.  Moves throughout his chest, pointing to his right thorax, and left chest with radiation to the center and down his arm.  Sometimes occurring at rest and worsened with exertion but never with nausea or diaphoresis. He also notes increased "phlegm" production that he has difficulty expectorating unless he is laying on his  right side.  This forceful coughing also provokes this burning/pleuritic chest discomfort.  earlier this morning he reported chest pain to nursing staff and telemetry central monitoring noted multiple beats of "ventricular tachycardia" at the time of the patient's chest discomfort.  Upon further review, I do not see any evidence of ventricular tachycardia, but there are multiple atrial runs and occasional PAC couplets with predominant sinus rhythm.  Fortunately, troponins x 2 were negative at 9, 10, and were also negative yesterday morning at 11, 11.  His EKG today does show new inferior, and anterolateral T wave inversions compared to prior studies.  At my time of evaluation this morning when he is sitting upright in bed, conversing with his wife and his chest pain-free.   Review of systems complete and found to be negative unless listed above     Past Medical History:  Diagnosis Date   AVM (arteriovenous malformation) of ascending colon 12/25/2012   Cancer (HCC)    Prostate   Coronary atherosclerosis of native coronary artery 05/26/2015   GERD (gastroesophageal reflux disease)    aspirin sensitive, prn meds only   Hyperlipidemia    Hypertension    Lumbar disc disease    Myocardial infarction [facility])    Nephrolithiasis    Personal history of colonic adenoma 12/30/2012   Ureteral duplication, right     Past Surgical History:  Procedure Laterality Date   ANGIOPLASTY     thigh   CARDIAC CATHETERIZATION N/A 07/05/2014   Procedure: Left Heart Cath and Coronary Angiography;  Surgeon: Marcina Millard, MD;  Location: [facility] INVASIVE CV LAB;  Service: Cardiovascular;  Laterality: N/A;   DOPPLER ECHOCARDIOGRAPHY  06/24/07   stress  echo normal. Nl baseline EF   HAND NERVE REPAIR  1970's   both hands   KNEE ARTHROSCOPY  1990's   right    LUMBAR DISC SURGERY  1993   PROSTATE SURGERY  05/2017   robotic prostatectomy   Right leg compound fracture repair  1976    Medications Prior to Admission   Medication Sig Dispense Refill Last Dose   aspirin EC 81 MG tablet Take 81 mg by mouth daily.   09/17/2022   fluticasone (FLONASE) 50 MCG/ACT nasal spray Place 1 spray into both nostrils daily. 16 g 3 09/16/2022   metoprolol succinate (TOPROL-XL) 25 MG 24 hr tablet TAKE 1 TABLET(25 MG) BY MOUTH DAILY 90 tablet 0 09/17/2022   pantoprazole (PROTONIX) 40 MG tablet Take 1 tablet (40 mg total) by mouth 2 (two) times daily before a meal. 60 tablet 3 09/17/2022   valsartan (DIOVAN) 160 MG tablet TAKE 1 TABLET(160 MG) BY MOUTH DAILY 90 tablet 3 09/17/2022   atorvastatin (LIPITOR) 40 MG tablet Take 1 tablet (40 mg total) by mouth daily. (Patient not taking: Reported on 09/17/2022) 90 tablet 3 Not Taking   cyclobenzaprine (FLEXERIL) 10 MG tablet Take 0.5-1 tablets (5-10 mg total) by mouth at bedtime as needed for muscle spasms. (Patient not taking: Reported on 09/17/2022) 30 tablet 0 Completed Course   HYDROcodone bit-homatropine (HYCODAN) 5-1.5 MG/5ML syrup Take 5 mLs by mouth at bedtime as needed for cough. (Patient not taking: Reported on 09/17/2022) 120 mL 0 Completed Course   Social History   Socioeconomic History   Marital status: Married    Spouse name: Not on file   Number of children: 0   Years of education: Not on file   Highest education level: Not on file  Occupational History   Occupation: Information systems    Employer: [employer]  Tobacco Use   Smoking status: Some Days    Current packs/day: 0.00    Average packs/day: 0.5 packs/day for 50.0 years (25.0 ttl pk-yrs)    Types: Cigarettes    Start date: 02/26/1959    Last attempt to quit: 02/25/2009    Years since quitting: 13.5    Passive exposure: Past (as a child)   Smokeless tobacco: Never   Tobacco comments:    Less then 1 pack a day [facility] 08/06/22  Vaping Use   Vaping status: Never Used  Substance and Sexual Activity   Alcohol use: No    Alcohol/week: 0.0 standard drinks of alcohol   Drug use: No   Sexual activity: Not on file  Other  Topics Concern   Not on file  Social History Narrative   2nd marriage   Wife has 2 children (1 died)   Works in IT      Has living will   [employer], have health care POA   Would accept resuscitation   No feeding tube if cognitively unaware   Social Determinants of Health   Financial Resource Strain: Not on file  Food Insecurity: Patient Declined (09/17/2022)   Hunger Vital Sign    Worried About Running Out of Food in the Last Year: Patient declined    Ran Out of Food in the Last Year: Patient declined  Transportation Needs: Patient Declined (09/17/2022)   PRAPARE - Administrator, Civil Service (Medical): Patient declined    Lack of Transportation (Non-Medical): Patient declined  Physical Activity: Not on file  Stress: Not on file  Social Connections: Not on file  Intimate Partner  Violence: Patient Declined (09/17/2022)   Humiliation, Afraid, Rape, and Kick questionnaire    Fear of Current or Ex-Partner: Patient declined    Emotionally Abused: Patient declined    Physically Abused: Patient declined    Sexually Abused: Patient declined    Family History  Problem Relation Age of Onset   Cancer Mother        colon cancer   Colon cancer Mother 66   Throat cancer Mother    Esophageal cancer Mother    Dementia Mother    Heart disease Father    Hypertension Father    Coronary artery disease Father    Valvular heart disease Father    Diabetes Neg Hx    Prostate cancer Neg Hx    Kidney cancer Neg Hx    Rectal cancer Neg Hx    Stomach cancer Neg Hx    Lung disease Neg Hx       Intake/Output Summary (Last 24 hours) at 09/18/2022 1011 Last data filed at 09/18/2022 0454 Gross per 24 hour  Intake 1063.33 ml  Output --  Net 1063.33 ml    Vitals:   09/17/22 2324 09/18/22 0435 09/18/22 0726 09/18/22 0818  BP: (!) 131/58 (!) 140/73 (!) 157/77 (!) 172/71  Pulse: 64 64 62 69  Resp: 18 18 20 16   Temp: 98 F (36.7 C) 97.7 F (36.5 C) 98.1 F  (36.7 C) 98.3 F (36.8 C)  TempSrc: Oral Oral Oral   SpO2: 98% 96% 96% 96%  Weight:      Height:        PHYSICAL EXAM General: Pleasant elderly male, well nourished, in no acute distress.  Sitting upright in bed with wife at bedside HEENT:  Normocephalic and atraumatic. Neck:  No JVD.  Lungs: Normal respiratory effort on room air.  Decreased breath sounds without appreciable crackles or wheezes  heart: HRRR with occasional early beats. Normal S1 and S2 without gallops or murmurs.  Abdomen: Non-distended appearing.  Msk: Normal strength and tone for age. Extremities: Warm and well perfused. No clubbing, cyanosis.  No peripheral edema.  Neuro: Alert and oriented X 3. Psych:  Answers questions appropriately.   Labs: Basic Metabolic Panel: Recent Labs    09/17/22 0922 09/18/22 0906  NA 138 135  K 4.1 3.8  CL 104 107  CO2 27 22  GLUCOSE 107* 119*  BUN 16 15  CREATININE 1.06 0.91  CALCIUM 8.8* 8.4*  MG  --  2.1   Liver Function Tests: No results for input(s): "AST", "ALT", "ALKPHOS", "BILITOT", "PROT", "ALBUMIN" in the last 72 hours. No results for input(s): "LIPASE", "AMYLASE" in the last 72 hours. CBC: Recent Labs    09/17/22 0922  WBC 9.5  HGB 15.8  HCT 47.0  MCV 92.7  PLT 260   Cardiac Enzymes: Recent Labs    09/17/22 0922 09/17/22 1122 09/18/22 0906  TROPONINIHS 11 11 9    BNP: No results for input(s): "BNP" in the last 72 hours. D-Dimer: No results for input(s): "DDIMER" in the last 72 hours. Hemoglobin A1C: Recent Labs    09/17/22 0922  HGBA1C 5.6   Fasting Lipid Panel: Recent Labs    09/18/22 0532  CHOL 194  HDL 43  LDLCALC 134*  TRIG 83  CHOLHDL 4.5   Thyroid Function Tests: No results for input(s): "TSH", "T4TOTAL", "T3FREE", "THYROIDAB" in the last 72 hours.  Invalid input(s): "FREET3" Anemia Panel: No results for input(s): "VITAMINB12", "FOLATE", "FERRITIN", "TIBC", "IRON", "RETICCTPCT" in the last 72 hours.  Radiology: MR  BRAIN WO CONTRAST  Result Date: 09/17/2022 CLINICAL DATA:  Provided history: Neuro deficit, acute, stroke suspected. Additional history provided: Headache. Blurred vision. Weakness. EXAM: MRI HEAD WITHOUT CONTRAST TECHNIQUE: Multiplanar, multiecho pulse sequences of the brain and surrounding structures were obtained without intravenous contrast. COMPARISON:  Non-contrast head CT and CT angiogram head/neck 09/17/2022. FINDINGS: Brain: No age advanced or lobar predominant parenchymal atrophy. Moderate-sized acute cortical/subcortical right PCA territory infarct within the right occipital lobe and posteromedial right temporal lobe. No significant mass effect. No evidence of hemorrhagic conversion. There are a few small nonspecific T2 FLAIR hyperintense chronic insults scattered elsewhere in the cerebral white matter. No evidence of an intracranial mass. No chronic intracranial blood products. No extra-axial fluid collection. No midline shift. Vascular: Maintained flow voids within the proximal large arterial vessels. Skull and upper cervical spine: No focal suspicious marrow lesion. Sinuses/Orbits: No mass or acute finding within the imaged orbits. Mild mucosal thickening within the bilateral ethmoid sinuses. Other: Small-volume fluid within the right mastoid air cells. IMPRESSION: 1. Moderate-sized acute cortical/subcortical right PCA territory infarct within the right occipital lobe and posteromedial right temporal lobe. No significant mass effect. No evidence of hemorrhagic conversion. 2. There are a few small nonspecific T2 FLAIR hyperintense chronic insults scattered elsewhere within the cerebral white matter. 3. Mild mucosal thickening within the bilateral ethmoid sinuses. 4. Small-volume fluid within the right mastoid air cells. Electronically Signed   By: Jackey Loge D.O.   On: 09/17/2022 14:11   CT ANGIO HEAD NECK W WO CM  Result Date: 09/17/2022 CLINICAL DATA:  Transient ischemic attack (TIA) EXAM: CT  ANGIOGRAPHY HEAD AND NECK WITH AND WITHOUT CONTRAST TECHNIQUE: Multidetector CT imaging of the head and neck was performed using the standard protocol during bolus administration of intravenous contrast. Multiplanar CT image reconstructions and MIPs were obtained to evaluate the vascular anatomy. Carotid stenosis measurements (when applicable) are obtained utilizing NASCET criteria, using the distal internal carotid diameter as the denominator. RADIATION DOSE REDUCTION: This exam was performed according to the departmental dose-optimization program which includes automated exposure control, adjustment of the mA and/or kV according to patient size and/or use of iterative reconstruction technique. CONTRAST:  80mL OMNIPAQUE IOHEXOL 350 MG/ML SOLN COMPARISON:  Same day CT head FINDINGS: CT HEAD FINDINGS See same day CT head for intracranial findings CTA NECK FINDINGS Aortic arch: Standard branching. Imaged portion shows no evidence of aneurysm or dissection. No significant stenosis of the major arch vessel origins. Right carotid system: No evidence of dissection, stenosis (50% or greater), or occlusion. Left carotid system: No evidence of dissection, stenosis (50% or greater), or occlusion. Vertebral arteries: Codominant. No evidence of dissection, stenosis (50% or greater), or occlusion. Skeleton: Negative. Other neck: Negative. Upper chest: Negative. Review of the MIP images confirms the above findings CTA HEAD FINDINGS Anterior circulation: No significant stenosis, proximal occlusion, aneurysm, or vascular malformation. Moderate narrowing in the cavernous (series 6, image 129). Segment of the right ICA Posterior circulation: No significant stenosis, proximal occlusion, aneurysm, or vascular malformation. Venous sinuses: As permitted by contrast timing, patent. Anatomic variants: Fetal PCA on the left Review of the MIP images confirms the above findings IMPRESSION: 1. No intracranial large vessel occlusion. 2.  Moderate narrowing in the cavernous segment of the right ICA. 3. No hemodynamically significant stenosis in the neck. 4. See same day CT head for intracranial findings. Electronically Signed   By: Lorenza Cambridge M.D.   On: 09/17/2022 13:20   CT HEAD WO CONTRAST (  )  Result Date: 09/17/2022 CLINICAL DATA:  Neuro deficit, acute, stroke suspected EXAM: CT HEAD WITHOUT CONTRAST TECHNIQUE: Contiguous axial images were obtained from the base of the skull through the vertex without intravenous contrast. RADIATION DOSE REDUCTION: This exam was performed according to the departmental dose-optimization program which includes automated exposure control, adjustment of the mA and/or kV according to patient size and/or use of iterative reconstruction technique. COMPARISON:  CT head 10/04/20 FINDINGS: Brain: There is an acute infarct in the right occipital lobe (series 3, image 15). No evidence of hemorrhage, hydrocephalus, extra-axial collection or mass lesion/mass effect. Vascular: No hyperdense vessel or unexpected calcification. Skull: Normal. Negative for fracture or focal lesion. Sinuses/Orbits: No middle ear or mastoid effusion. Paranasal sinuses are clear. Orbits are unremarkable. Other: None. IMPRESSION: Acute infarct in the right occipital lobe.  No hemorrhage. Electronically Signed   By: Lorenza Cambridge M.D.   On: 09/17/2022 10:36   DG Chest 2 View  Result Date: 09/17/2022 CLINICAL DATA:  Chest pain EXAM: CHEST - 2 VIEW COMPARISON:  X-ray 07/01/2022 FINDINGS: Hyperinflation. No consolidation, pneumothorax or effusion. No edema. Normal cardiopericardial silhouette. Mild degenerative changes of the spine on lateral view. IMPRESSION: No acute cardiopulmonary disease. Electronically Signed   By: Karen Kays M.D.   On: 09/17/2022 10:18    ECHO 12/2021 DOPPLER ECHO and OTHER SPECIAL PROCEDURES                 Aortic: No AR                      No AS                         118.0 cm/sec peak vel      5.6 mmHg peak  grad                 Mitral: TRIVIAL MR                 No MS                         MV Inflow E Vel = 51.9 cm/sec     MV Annulus E'Vel = 5.9 cm/sec                         E/E'Ratio = 8.8              Tricuspid: TRIVIAL TR                 No TS              Pulmonary: TRIVIAL PR                 No PS                         86.6 cm/sec peak vel       3.0 mmHg peak grad  _________________________________________________________________________________________  INTERPRETATION  NORMAL LEFT VENTRICULAR SYSTOLIC FUNCTION  NORMAL RIGHT VENTRICULAR SYSTOLIC FUNCTION  TRIVIAL REGURGITATION NOTED (See above)  NO VALVULAR STENOSIS  TRIVIAL MR, TR, PR  EF 50'%   Lexiscan myoview 01/07/2022 1.  Mildly reduced left ventricular function  2.  No regional wall motion abnormalities  3.  Mild to moderate mixed inferior wall defect consistent with scar with  ischemia   LHC 07/05/2014 - Dr.  Paraschos Mid Cx to Dist Cx lesion, 30% stenosed. Mid LAD lesion, 40% stenosed. Mid LAD to Dist LAD lesion, 30% stenosed. Dist LAD lesion, 50% stenosed. Prox RCA lesion, 30% stenosed. Dist RCA-1 lesion, 30% stenosed. Dist RCA-2 lesion, 50% stenosed. RPDA lesion, 100% stenosed.   Occluded mid PDA likely culprit vessel.     REC   Medical therapy. Cont aspirin, metoprolol, Imdur. Stop heparin.  TELEMETRY reviewed by me (LT) 09/18/2022 : Predominant sinus rhythm with rate 70s, multiple short atrial runs and premature atrial contractions without obvious evidence of atrial fibrillation, and no evidence of ventricular tachycardia  EKG reviewed by me: Sinus rhythm with rate 67bpm, new inferior, and anterolateral T wave inversions   Data reviewed by me (LT) 09/18/2022: Last outpatient cardiology note, last PCP note, ED note, neurology consult note, admission H&P last 24h vitals tele labs imaging I/O   Principal Problem:   CVA (cerebral vascular accident) (HCC) Active Problems:   Hypertension   Hyperlipidemia    GERD (gastroesophageal reflux disease)   Chest pain   Tobacco abuse counseling   HFrEF (heart failure with reduced ejection fraction) (HCC)    ASSESSMENT AND PLAN:  Justin Terrell is a 19yoM with a PMH of CAD (NSTEMI 07/05/14 - occluded mid PDA, medically managed), HFpEF (50%, g1DD 12/2021), HTN, HLD, ascending colon AVM, hx prostate cancer, ongoing tobacco abuse, who presented to [facility] ED 09/17/2022 with left-sided vision loss and blurriness, and with intermittent chest pain.  MRI brain revealed moderate acute cortical/subcortical right PCA infarct. Cardiology is consulted for further evaluation of his chest discomfort.  # Acute CVA -Agree with current therapy per neurology, on DAPT, high intensity statin  # Palpitations # Abnormal EKG # CAD w/ hx occluded mid PDA As described in HPI, symptoms of burning and migrating chest pain a/w breathlessness correlates with multiple short atrial runs on telemetry, withough obvious evidence of AF. Fortunately troponins are negative on multiple repeats, EKG does show new T wave inversions that could be secondary to his acute CVA, without current clinical concern for ACS.  He is conversational and comfortable, hemodynamically stable without active chest pain at my time of evaluation -Agree with current therapy as above -Change home metoprolol succinate 25 mg daily to metoprolol tartrate 25 mg twice daily -Continue home valsartan 160 mg daily - nightly magnesium glycinate for symptomatic palpitations -Aggressive risk factor modification, LDL is above goal.  A1c 5.6%  -Echo complete -Ambulatory Holter monitoring for further characterization/burden of arrhythmia.  Noted clinic staff to place before discharge.  # Tobacco abuse Currently smokes 1 pack/day, motivated to quit at discharge.  Wife has discarded remaining tobacco products and ashtrays at their home.  Ok for discharge today from a cardiac perspective. Will arrange for follow up in clinic with Dr.  Darrold Junker following return of Holter monitor, or sooner if needed.  This patient's plan of care was discussed and created with Dr. Juliann Pares and he is in agreement.  Signed: Rebeca Allegra , PA-C 09/18/2022, 10:11 AM [facility] Cardiology

## 2022-09-18 NOTE — Progress Notes (Signed)
Progress Note   Patient: Justin Terrell:[id number] DOB: 05/08/1955 DOA: 09/17/2022     1 DOS: the patient was seen and examined on 09/18/2022   Brief hospital course:  HPI on admission 09/17/22 by Dr. Alvester Morin:  "Justin Terrell is a 67 y.o. male with medical history significant of coronary artery disease status post stenting, GERD, hypertension, hyperlipidemia presenting with CVA and chest pain.  Patient reports approximately 2 days of headache and vision changes.  No focal hemiparesis or confusion.  No dysarthria.  Has had intermittent headaches over the past month or so.  Noted baseline 1 pack/day smoker.  Also noted history of NSTEMI with noted occluded PDA 06/27/18/2016.  Reports intermittent episodes of chest pain over multiple months.  Chest pain abdominally with deep breathing.  Sometimes with movement.  Mild nausea.  No shortness of breath. Presented to the ER afebrile, blood pressures 140s to 200s over 60s to 90s.  White count 9.5, hemoglobin 15.8, platelets 260, creatinine 1.06.  Troponin negative x 2.  EKG normal sinus rhythm with some?  ST depressions in anterior lateral leads.  CT head with noted right occipital infarct.  Neurology consulted for formal evaluation."   7/24 -- stroke evaluation complete.  See Neurology recommendations. Patient continues to have headaches today. Cardiology saw for chest pain that is felt non-ischemia in nature. They have placed a heart monitor and will follow up with patient on results.     Assessment and Plan: * CVA (cerebral vascular accident) (HCC) Pt presented with headache and vision changes x 2 days with noted acute infarct in the right occipital lobe on MRI brain. --Neurology consulted --Remainder of stroke evaluation completed and unremarkable --Continue DAPT with ASA/Plavix indefinitely --High dose Lipitor --Resume antihypertensives, now out of permissive HTN window --Smoking cessation --Evaluation with [employer] OT prior to resume  driving  Chest pain Recurrent pleuritic chest pain for several months per patient  Hx of prior NSTEMI, occluded mid PDA 07/05/2014.  Pt notes character of this pain is different. Troponins trended and negative. Some mild ST depressions in anterior lateral leads potentially related to stroke.  --Cardiology consulted - see recs --on DAPT given stroke while on ASA monotherapy --On high-intensity statin --Stat EKG and troponin for acute chest pain  Headache Possibly related to stroke. --Tylenol or Fioricet PRN for now --?Repeat neuroimaging if headaches or escalating or intractable.  HFrEF (heart failure with reduced ejection fraction) (HCC) 2D echo November 2023 with EF 45 to 50%.  Echo this admission normal / recovered EF with grade I diastolic dysfunction Appears euvolemic, compensated. Monitor volume status  Tobacco abuse counseling 1 PPD smoker  Discussed cessation at length  Start nicotine patch    GERD (gastroesophageal reflux disease) PPI  Hyperlipidemia High dose statin    Hypertension Resume metoprolol ARB resume tomorrow PRN hydralazine if SBP > 160 Now outside permissive HTN window.        Subjective: Pt seen with wife at bedside.  He has headache again today, right-sided.  Vision changes had improved but little bit worse again at time of my encounter.  Vision changes fluctuate.  He describes chest pain as non-central, burning in nature.  Was sent to pulmonology without an answer.     Physical Exam: Vitals:   09/18/22 0726 09/18/22 0818 09/18/22 1120 09/18/22 1559  BP: (!) 157/77 (!) 172/71 138/72 (!) 172/77  Pulse: 62 69 64 63  Resp: 20 16 18 20   Temp: 98.1 F (36.7 C) 98.3 F (36.8 C) 97.9  F (36.6 C) 97.8 F (36.6 C)  TempSrc: Oral  Oral   SpO2: 96% 96% 97% 98%  Weight:      Height:       General exam: awake, alert, no acute distress HEENT: atraumatic, clear conjunctiva, anicteric sclera, moist mucus membranes, hearing grossly normal   Respiratory system: CTAB, no wheezes, rales or rhonchi, normal respiratory effort. Cardiovascular system: normal S1/S2, RRR, no JVD, murmurs, rubs, gallops, no pedal edema.   Gastrointestinal system: soft, NT, ND, no HSM felt, +bowel sounds. Central nervous system: A&O x4. no gross focal neurologic deficits, normal speech Extremities: moves all, no edema, normal tone Skin: dry, intact, normal temperature Psychiatry: normal mood, congruent affect, judgement and insight appear normal   Data Reviewed:  Notable labs ---   Glucose 119, ca 8.4 otherwise normal BMP LDL 134 otherwise normal lipid profile  Family Communication: wife at bedside  Disposition: Status is: Inpatient Remains inpatient appropriate because: ongoing headaches that warrant close monitoring.    Planned Discharge Destination: Home    Time spent: 46 minutes  Author: Pennie Banter, DO 09/18/2022 5:47 PM  For on call review [email or web address].

## 2022-09-18 NOTE — Assessment & Plan Note (Signed)
Possibly related to stroke. --Tylenol or Fioricet PRN for now --?Repeat neuroimaging if headaches or escalating or intractable.

## 2022-09-18 NOTE — Progress Notes (Signed)
*  PRELIMINARY RESULTS* Echocardiogram 2D Echocardiogram has been performed.  Justin Terrell 09/18/2022, 4:00 PM

## 2022-09-19 DIAGNOSIS — R519 Headache, unspecified: Secondary | ICD-10-CM | POA: Diagnosis not present

## 2022-09-19 DIAGNOSIS — I10 Essential (primary) hypertension: Secondary | ICD-10-CM | POA: Diagnosis not present

## 2022-09-19 DIAGNOSIS — I639 Cerebral infarction, unspecified: Secondary | ICD-10-CM | POA: Diagnosis not present

## 2022-09-19 LAB — GLUCOSE, CAPILLARY: Glucose-Capillary: 96 mg/dL (ref 70–99)

## 2022-09-19 MED ORDER — IRBESARTAN 150 MG PO TABS
150.0000 mg | ORAL_TABLET | Freq: Every day | ORAL | Status: DC
  Administered 2022-09-19: 150 mg via ORAL
  Filled 2022-09-19: qty 1

## 2022-09-19 NOTE — Discharge Summary (Addendum)
Physician Discharge Summary   Patient: Justin Terrell MRN: [id number] DOB: Feb 24, 1956  Admit date:     09/17/2022  Discharge date: 09/19/22  Discharge Physician: Pennie Banter   PCP: Karie Schwalbe, MD   Recommendations at discharge:   Follow up with Primary Care Follow with [facility]-aff[facility] Occupational Therapy to assess for return to driving  Follow up patient's efforts at smoking cessation  Discharge Diagnoses: Principal Problem:   CVA (cerebral vascular accident) [facility]) Active Problems:   Chest pain   Hypertension   Hyperlipidemia   GERD (gastroesophageal reflux disease)   Tobacco abuse counseling   Chronic HFrEF (heart failure with reduced ejection fraction) (HCC)   Headache  Resolved Problems:   * No resolved hospital problems. [facility] Course:  HPI on admission 09/17/22 by Dr. Alvester Morin:  "Justin Terrell is a 67 y.o. male with medical history significant of coronary artery disease status post stenting, GERD, hypertension, hyperlipidemia presenting with CVA and chest pain.  Patient reports approximately 2 days of headache and vision changes.  No focal hemiparesis or confusion.  No dysarthria.  Has had intermittent headaches over the past month or so.  Noted baseline 1 pack/day smoker.  Also noted history of NSTEMI with noted occluded PDA 06/27/18/2016.  Reports intermittent episodes of chest pain over multiple months.  Chest pain abdominally with deep breathing.  Sometimes with movement.  Mild nausea.  No shortness of breath. Presented to the ER afebrile, blood pressures 140s to 200s over 60s to 90s.  White count 9.5, hemoglobin 15.8, platelets 260, creatinine 1.06.  Troponin negative x 2.  EKG normal sinus rhythm with some?  ST depressions in anterior lateral leads.  CT head with noted right occipital infarct.  Neurology consulted for formal evaluation."     7/24 -- stroke evaluation complete.  See Neurology recommendations. Patient continues to have headaches  today. Cardiology saw for chest pain that is felt non-ischemia in nature. They have placed a heart monitor and will follow up with patient on results.    7/25 -- patient feels better this AM.  No headache. Had one episode chest pain overnight with onset after stress related to roommate in shared room.  He does get them at rest unrelated to stress at home however.  He and wife feel reassured that cardiac etiology is ruled out.   Pt medically stable and agreeable to discharge home today.    Assessment and Plan: * CVA (cerebral vascular accident) (HCC) Pt presented with headache and vision changes x 2 days with noted acute infarct in the right occipital lobe on MRI brain. --Neurology consulted --Remainder of stroke evaluation completed and unremarkable --Continue DAPT with ASA/Plavix indefinitely --High dose Lipitor -- pt reported prior myalgias on statin; will discuss tolerance with PCP at follow up --Resume antihypertensives, now out of permissive HTN window --Smoking cessation --Evaluation with [facility] OT prior to resume driving  Chest pain Recurrent pleuritic chest pain for several months per patient  Hx of prior NSTEMI, occluded mid PDA 07/05/2014.  Pt notes character of this pain is different. Troponins trended and negative. Some mild ST depressions in anterior lateral leads potentially related to stroke.  --Cardiology consulted - see recs --on DAPT given stroke while on ASA monotherapy --On high-intensity statin --Stat EKG and troponin for acute chest pain  Headache Possibly related to stroke but more likely migraine. --Tylenol or Fioricet PRN  --Consider referral to neurology for headaches if persistent and frequent  Chronic HFrEF (heart failure with reduced  ejection fraction) (HCC) 2D echo November 2023 with EF 45 to 50%.  Echo this admission normal EF with grade I diastolic dysfunction. Appears euvolemic, compensated. Monitor volume status  Tobacco abuse counseling 1 PPD  smoker  Discussed cessation at length  Start nicotine patch    GERD (gastroesophageal reflux disease) PPI  Hyperlipidemia High dose statin    Hypertension Continue metoprolol and valsartan         Consultants: Neurology, Cardiology Procedures performed: Echocardiogram   Disposition: Home  Diet recommendation:  Discharge Diet Orders (From admission, onward)     Start     Ordered   09/18/22 0000  Diet - low sodium heart healthy        09/18/22 1649            DISCHARGE MEDICATION: Allergies as of 09/19/2022       Reactions   Codeine Nausea And Vomiting   Pravastatin Other (See Comments)   Reaction:  Chest pain         Medication List     STOP taking these medications    cyclobenzaprine 10 MG tablet Commonly known as: FLEXERIL   HYDROcodone bit-homatropine 5-1.5 MG/5ML syrup Commonly known as: HYCODAN   metoprolol succinate 25 MG 24 hr tablet Commonly known as: TOPROL-XL       TAKE these medications    aspirin EC 81 MG tablet Take 81 mg by mouth daily.   atorvastatin 80 MG tablet Commonly known as: LIPITOR Take 1 tablet (80 mg total) by mouth every evening. What changed:  medication strength how much to take when to take this   butalbital-acetaminophen-caffeine 50-325-40 MG tablet Commonly known as: FIORICET Take 1 tablet by mouth every 6 (six) hours as needed for headache.   clopidogrel 75 MG tablet Commonly known as: PLAVIX Take 1 tablet (75 mg total) by mouth daily.   fluticasone 50 MCG/ACT nasal spray Commonly known as: FLONASE Place 1 spray into both nostrils daily.   Magnesium Glycinate 100 MG Caps Take 100 mg by mouth at bedtime.   metoprolol tartrate 25 MG tablet Commonly known as: LOPRESSOR Take 1 tablet (25 mg total) by mouth 2 (two) times daily.   pantoprazole 40 MG tablet Commonly known as: Protonix Take 1 tablet (40 mg total) by mouth 2 (two) times daily before a meal.   valsartan 160 MG tablet Commonly  known as: DIOVAN TAKE 1 TABLET(160 MG) BY MOUTH DAILY        Follow-up Information     Marcina Millard, MD. Go on 10/09/2022.   Specialty: Cardiology Why: @9 :00am w/Rebecca Dyann Ruddle information: [address] [facility] [place] [address] [phone]                Discharge Exam: Ceasar Mons Weights   09/17/22 0920  Weight: 82.6 kg   General exam: awake, alert, no acute distress HEENT: atraumatic, clear conjunctiva, anicteric sclera, moist mucus membranes, hearing grossly normal  Respiratory system: CTA, no wheezes, rales or rhonchi, normal respiratory effort. Cardiovascular system: normal S1/S2,  RRR, no JVD, murmurs, rubs, gallops, no pedal edema.   Gastrointestinal system: soft, NT, ND, no HSM felt, +bowel sounds. Central nervous system: A&O x4. no gross focal neurologic deficits, normal speech Extremities: moves all, no edema, normal tone Skin: dry, intact, normal temperature, normal color, No rashes, lesions or ulcers Psychiatry: normal mood, congruent affect, judgement and insight appear normal   Condition at discharge: stable  The results of significant diagnostics from this hospitalization (including imaging,  microbiology, ancillary and laboratory) are listed below for reference.   Imaging Studies: ECHOCARDIOGRAM COMPLETE  Result Date: 09/18/2022    ECHOCARDIOGRAM REPORT   Patient Name:   PELHAM HENNICK Date of Exam: 09/18/2022 Medical Rec #:  [id number]      Height:       72.0 in Accession #:    [id number]     Weight:       182.1 lb Date of Birth:  [birth year]       BSA:          2.047 m Patient Age:    67 years       BP:           138/72 mmHg Patient Gender: M              HR:           63 bpm. Exam Location:  [facility] Procedure: 2D Echo, Cardiac Doppler, Color Doppler and Strain Analysis Indications:     TIA  History:         Patient has prior history of Echocardiogram examinations, most                  recent 04/04/2010. CAD, TIA  and Stroke, Signs/Symptoms:Chest                  Pain; Risk Factors:Hypertension, Dyslipidemia and Current                  Smoker.  Sonographer:     Mikki Harbor Referring Phys:  [id number] Raegen Tarpley A Lashonda Sonneborn Diagnosing Phys: Alwyn Pea MD  Sonographer Comments: Technically difficult study due to poor echo windows and suboptimal parasternal window. Image acquisition challenging due to respiratory motion. Global longitudinal strain was attempted. IMPRESSIONS  1. Left ventricular ejection fraction, by estimation, is 60 to 65%. The left ventricle has normal function. The left ventricle has no regional wall motion abnormalities. Left ventricular diastolic parameters are consistent with Grade I diastolic dysfunction (impaired relaxation).  2. Right ventricular systolic function is normal. The right ventricular size is normal. Mildly increased right ventricular wall thickness.  3. The mitral valve is normal in structure. Mild mitral valve regurgitation.  4. The aortic valve is grossly normal. Aortic valve regurgitation is not visualized. FINDINGS  Left Ventricle: Left ventricular ejection fraction, by estimation, is 60 to 65%. The left ventricle has normal function. The left ventricle has no regional wall motion abnormalities. The left ventricular internal cavity size was normal in size. There is  borderline left ventricular hypertrophy. Left ventricular diastolic parameters are consistent with Grade I diastolic dysfunction (impaired relaxation). Right Ventricle: The right ventricular size is normal. Mildly increased right ventricular wall thickness. Right ventricular systolic function is normal. Left Atrium: Left atrial size was normal in size. Right Atrium: Right atrial size was normal in size. Pericardium: There is no evidence of pericardial effusion. Mitral Valve: The mitral valve is normal in structure. Mild mitral valve regurgitation. MV peak gradient, 2.9 mmHg. The mean mitral valve gradient is 1.0 mmHg.  Tricuspid Valve: The tricuspid valve is normal in structure. Tricuspid valve regurgitation is trivial. Aortic Valve: The aortic valve is grossly normal. Aortic valve regurgitation is not visualized. Aortic valve mean gradient measures 3.0 mmHg. Aortic valve peak gradient measures 7.2 mmHg. Aortic valve area, by VTI measures 2.54 cm. Pulmonic Valve: The pulmonic valve was normal in structure. Pulmonic valve regurgitation is not visualized. Aorta: The ascending aorta was not well visualized. IAS/Shunts: No  atrial level shunt detected by color flow Doppler.  LEFT VENTRICLE PLAX 2D LVIDd:         4.90 cm     Diastology LVIDs:         3.30 cm     LV e' medial:    7.51 cm/s LV PW:         0.90 cm     LV E/e' medial:  6.5 LV IVS:        1.20 cm     LV e' lateral:   9.03 cm/s LVOT diam:     2.10 cm     LV E/e' lateral: 5.4 LV SV:         68 LV SV Index:   33 LVOT Area:     3.46 cm  LV Volumes (MOD) LV vol d, MOD A2C: 79.4 ml LV vol d, MOD A4C: 83.3 ml LV vol s, MOD A2C: 39.2 ml LV vol s, MOD A4C: 31.6 ml LV SV MOD A2C:     40.2 ml LV SV MOD A4C:     83.3 ml LV SV MOD BP:      47.0 ml RIGHT VENTRICLE RV Basal diam:  3.35 cm RV Mid diam:    2.60 cm RV S prime:     13.60 cm/s TAPSE (M-mode): 2.4 cm LEFT ATRIUM             Index        RIGHT ATRIUM           Index LA diam:        3.50 cm 1.71 cm/m   RA Area:     16.20 cm LA Vol (A2C):   29.7 ml 14.51 ml/m  RA Volume:   42.40 ml  20.71 ml/m LA Vol (A4C):   35.1 ml 17.14 ml/m LA Biplane Vol: 34.4 ml 16.80 ml/m  AORTIC VALVE AV Area (Vmax):    2.42 cm AV Area (Vmean):   2.20 cm AV Area (VTI):     2.54 cm AV Vmax:           134.00 cm/s AV Vmean:          85.000 cm/s AV VTI:            0.269 m AV Peak Grad:      7.2 mmHg AV Mean Grad:      3.0 mmHg LVOT Vmax:         93.50 cm/s LVOT Vmean:        54.100 cm/s LVOT VTI:          0.197 m LVOT/AV VTI ratio: 0.73  AORTA Ao Root diam: 3.30 cm MITRAL VALVE MV Area (PHT): 2.22 cm    SHUNTS MV Area VTI:   2.17 cm    Systemic  VTI:  0.20 m MV Peak grad:  2.9 mmHg    Systemic Diam: 2.10 cm MV Mean grad:  1.0 mmHg MV Vmax:       0.85 m/s MV Vmean:      46.7 cm/s MV Decel Time: 341 msec MV E velocity: 48.60 cm/s MV A velocity: 59.10 cm/s MV E/A ratio:  0.82 Dwayne D Callwood MD Electronically signed by Alwyn Pea MD Signature Date/Time: 09/18/2022/4:44:22 PM    Final    MR BRAIN WO CONTRAST  Result Date: 09/17/2022 CLINICAL DATA:  Provided history: Neuro deficit, acute, stroke suspected. Additional history provided: Headache. Blurred vision. Weakness. EXAM: MRI HEAD WITHOUT CONTRAST TECHNIQUE: Multiplanar, multiecho pulse sequences of  the brain and surrounding structures were obtained without intravenous contrast. COMPARISON:  Non-contrast head CT and CT angiogram head/neck 09/17/2022. FINDINGS: Brain: No age advanced or lobar predominant parenchymal atrophy. Moderate-sized acute cortical/subcortical right PCA territory infarct within the right occipital lobe and posteromedial right temporal lobe. No significant mass effect. No evidence of hemorrhagic conversion. There are a few small nonspecific T2 FLAIR hyperintense chronic insults scattered elsewhere in the cerebral white matter. No evidence of an intracranial mass. No chronic intracranial blood products. No extra-axial fluid collection. No midline shift. Vascular: Maintained flow voids within the proximal large arterial vessels. Skull and upper cervical spine: No focal suspicious marrow lesion. Sinuses/Orbits: No mass or acute finding within the imaged orbits. Mild mucosal thickening within the bilateral ethmoid sinuses. Other: Small-volume fluid within the right mastoid air cells. IMPRESSION: 1. Moderate-sized acute cortical/subcortical right PCA territory infarct within the right occipital lobe and posteromedial right temporal lobe. No significant mass effect. No evidence of hemorrhagic conversion. 2. There are a few small nonspecific T2 FLAIR hyperintense chronic insults  scattered elsewhere within the cerebral white matter. 3. Mild mucosal thickening within the bilateral ethmoid sinuses. 4. Small-volume fluid within the right mastoid air cells. Electronically Signed   By: Jackey Loge D.O.   On: 09/17/2022 14:11   CT ANGIO HEAD NECK W WO CM  Result Date: 09/17/2022 CLINICAL DATA:  Transient ischemic attack (TIA) EXAM: CT ANGIOGRAPHY HEAD AND NECK WITH AND WITHOUT CONTRAST TECHNIQUE: Multidetector CT imaging of the head and neck was performed using the standard protocol during bolus administration of intravenous contrast. Multiplanar CT image reconstructions and MIPs were obtained to evaluate the vascular anatomy. Carotid stenosis measurements (when applicable) are obtained utilizing NASCET criteria, using the distal internal carotid diameter as the denominator. RADIATION DOSE REDUCTION: This exam was performed according to the departmental dose-optimization program which includes automated exposure control, adjustment of the mA and/or kV according to patient size and/or use of iterative reconstruction technique. CONTRAST:  80mL OMNIPAQUE IOHEXOL 350 MG/ML SOLN COMPARISON:  Same day CT head FINDINGS: CT HEAD FINDINGS See same day CT head for intracranial findings CTA NECK FINDINGS Aortic arch: Standard branching. Imaged portion shows no evidence of aneurysm or dissection. No significant stenosis of the major arch vessel origins. Right carotid system: No evidence of dissection, stenosis (50% or greater), or occlusion. Left carotid system: No evidence of dissection, stenosis (50% or greater), or occlusion. Vertebral arteries: Codominant. No evidence of dissection, stenosis (50% or greater), or occlusion. Skeleton: Negative. Other neck: Negative. Upper chest: Negative. Review of the MIP images confirms the above findings CTA HEAD FINDINGS Anterior circulation: No significant stenosis, proximal occlusion, aneurysm, or vascular malformation. Moderate narrowing in the cavernous (series  6, image 129). Segment of the right ICA Posterior circulation: No significant stenosis, proximal occlusion, aneurysm, or vascular malformation. Venous sinuses: As permitted by contrast timing, patent. Anatomic variants: Fetal PCA on the left Review of the MIP images confirms the above findings IMPRESSION: 1. No intracranial large vessel occlusion. 2. Moderate narrowing in the cavernous segment of the right ICA. 3. No hemodynamically significant stenosis in the neck. 4. See same day CT head for intracranial findings. Electronically Signed   By: Lorenza Cambridge M.D.   On: 09/17/2022 13:20   CT HEAD WO CONTRAST ( )  Result Date: 09/17/2022 CLINICAL DATA:  Neuro deficit, acute, stroke suspected EXAM: CT HEAD WITHOUT CONTRAST TECHNIQUE: Contiguous axial images were obtained from the base of the skull through the vertex without intravenous contrast. RADIATION DOSE  REDUCTION: This exam was performed according to the departmental dose-optimization program which includes automated exposure control, adjustment of the mA and/or kV according to patient size and/or use of iterative reconstruction technique. COMPARISON:  CT head 10/04/20 FINDINGS: Brain: There is an acute infarct in the right occipital lobe (series 3, image 15). No evidence of hemorrhage, hydrocephalus, extra-axial collection or mass lesion/mass effect. Vascular: No hyperdense vessel or unexpected calcification. Skull: Normal. Negative for fracture or focal lesion. Sinuses/Orbits: No middle ear or mastoid effusion. Paranasal sinuses are clear. Orbits are unremarkable. Other: None. IMPRESSION: Acute infarct in the right occipital lobe.  No hemorrhage. Electronically Signed   By: Lorenza Cambridge M.D.   On: 09/17/2022 10:36   DG Chest 2 View  Result Date: 09/17/2022 CLINICAL DATA:  Chest pain EXAM: CHEST - 2 VIEW COMPARISON:  X-ray 07/01/2022 FINDINGS: Hyperinflation. No consolidation, pneumothorax or effusion. No edema. Normal cardiopericardial silhouette.  Mild degenerative changes of the spine on lateral view. IMPRESSION: No acute cardiopulmonary disease. Electronically Signed   By: Karen Kays M.D.   On: 09/17/2022 10:18    Microbiology: Results for orders placed or performed in visit on 10/15/16  Microscopic Examination     Status: Abnormal   Collection Time: 10/15/16  9:41 AM   URINE  Result Value Ref Range Status   WBC, UA None seen 0 - 5 /hpf Final   RBC, UA 3-10 (A) 0 - 2 /hpf Final   Epithelial Cells (non renal) None seen 0 - 10 /hpf Final   Mucus, UA Present (A) Not Estab. Final   Bacteria, UA Few (A) None seen/Few Final  CULTURE, URINE COMPREHENSIVE     Status: None   Collection Time: 10/15/16  9:54 AM   Specimen: Urine   URINE  Result Value Ref Range Status   Urine Culture, Comprehensive Final report  Final   Organism ID, Bacteria Comment  Final    Comment: No growth in 36 - 48 hours.    Labs: CBC: Recent Labs  Lab 09/17/22 0922  WBC 9.5  HGB 15.8  HCT 47.0  MCV 92.7  PLT 260   Basic Metabolic Panel: Recent Labs  Lab 09/17/22 0922 09/18/22 0906  NA 138 135  K 4.1 3.8  CL 104 107  CO2 27 22  GLUCOSE 107* 119*  BUN 16 15  CREATININE 1.06 0.91  CALCIUM 8.8* 8.4*  MG  --  2.1   Liver Function Tests: No results for input(s): "AST", "ALT", "ALKPHOS", "BILITOT", "PROT", "ALBUMIN" in the last 168 hours. CBG: Recent Labs  Lab 09/18/22 0435 09/19/22 0429  GLUCAP 92 96    Discharge time spent: less than 30 minutes.  Signed: Pennie Banter, DO [facility] 09/19/2022

## 2022-09-19 NOTE — Plan of Care (Signed)
  Problem: Ischemic Stroke/TIA Tissue Perfusion: Goal: Complications of ischemic stroke/TIA will be minimized Outcome: Progressing   Problem: Coping: Goal: Will verbalize positive feelings about self Outcome: Progressing Goal: Will identify appropriate support needs Outcome: Progressing   Problem: Activity: Goal: Risk for activity intolerance will decrease Outcome: Progressing   Problem: Nutrition: Goal: Adequate nutrition will be maintained Outcome: Progressing   Problem: Pain Managment: Goal: General experience of comfort will improve Outcome: Progressing   Problem: Safety: Goal: Ability to remain free from injury will improve Outcome: Progressing

## 2022-09-20 ENCOUNTER — Telehealth: Payer: Self-pay | Admitting: *Deleted

## 2022-09-20 NOTE — Transitions of Care (Post Inpatient/ED Visit) (Signed)
09/20/2022  Name: Justin Terrell MRN: [id number] DOB: 12/05/1955  Today's TOC FU Call Status: Today's TOC FU Call Status:: Successful TOC FU Call Competed TOC FU Call Complete Date: 09/20/22  Transition Care Management Follow-up Telephone Call Date of Discharge: 09/19/22 Discharge Facility: [facility]) Type of Discharge: Inpatient Admission Primary Inpatient Discharge Diagnosis:: Rt Occipital CVA How have you been since you were released from the hospital?: Better Any questions or concerns?: Yes Patient Questions/Concerns:: Why do I have to go to the [facility] to be evaluated to drive? Because the location of your stroke in the right occipital lobe stroke significantly increases the risk of  MVA due to collisions with less easily perceived oncoming traffic in the left field of vision Patient Questions/Concerns Addressed: Other:  Items Reviewed: Did you receive and understand the discharge instructions provided?: Yes Medications obtained,verified, and reconciled?: Yes (Medications Reviewed) Any new allergies since your discharge?: No Dietary orders reviewed?: No Do you have support at home?: Yes People in Home: spouse Name of Support/Comfort Primary Source: Rosey Bath  Medications Reviewed Today: Medications Reviewed Today     Reviewed by Luella Cook, RN (Case Manager) on 09/20/22 at 1230  Med List Status: <None>   Medication Order Taking? Sig Documenting Provider Last Dose Status Informant  aspirin EC 81 MG tablet [id number] Yes Take 81 mg by mouth daily. [provider] Taking Active Self  atorvastatin (LIPITOR) 80 MG tablet [id number] Yes Take 1 tablet (80 mg total) by mouth every evening. Pennie Banter, DO Taking Active   butalbital-acetaminophen-caffeine (FIORICET) 50-325-40 MG tablet [id number] Yes Take 1 tablet by mouth every 6 (six) hours as needed for headache. Pennie Banter, DO Taking Active   clopidogrel (PLAVIX) 75 MG tablet  [id number] Yes Take 1 tablet (75 mg total) by mouth daily. Esaw Grandchild A, DO Taking Active   fluticasone (FLONASE) 50 MCG/ACT nasal spray [id number] Yes Place 1 spray into both nostrils daily. Charlott Holler, MD Taking Active Self  Magnesium Glycinate 100 MG CAPS [id number] Yes Take 100 mg by mouth at bedtime. Pennie Banter, DO Taking Active   metoprolol tartrate (LOPRESSOR) 25 MG tablet [id number] Yes Take 1 tablet (25 mg total) by mouth 2 (two) times daily. Pennie Banter, DO Taking Active   pantoprazole (PROTONIX) 40 MG tablet [id number] Yes Take 1 tablet (40 mg total) by mouth 2 (two) times daily before a meal. Charlott Holler, MD Taking Active Self  valsartan (DIOVAN) 160 MG tablet [id number] Yes TAKE 1 TABLET(160 MG) BY MOUTH DAILY Karie Schwalbe, MD Taking Active Self            Home Care and Equipment/Supplies: Were Home Health Services Ordered?: NA Any new equipment or medical supplies ordered?: NA  Functional Questionnaire: Do you need assistance with bathing/showering or dressing?: No Do you need assistance with meal preparation?: No Do you need assistance with eating?: No Do you have difficulty maintaining continence: No Do you need assistance with getting out of bed/getting out of a chair/moving?: No Do you have difficulty managing or taking your medications?: No  Follow up appointments reviewed: PCP Follow-up appointment confirmed?: Yes Date of PCP follow-up appointment?: 10/03/22 Follow-up Provider: Dr Alphonsus Sias [facility] Follow-up appointment confirmed?: Yes Date of Specialist follow-up appointment?: 10/07/22 Follow-Up Specialty Provider:: [id number] Felix Ahmadi Pulmonary, [id number] Willodean Rosenthal Cardiology Do you need transportation to your follow-up appointment?: No Do you understand care options if your condition(s) worsen?: Yes-patient verbalized understanding  SDOH Interventions Today  Flowsheet Row Most Recent Value  SDOH Interventions    Food Insecurity Interventions Intervention Not Indicated  Housing Interventions Intervention Not Indicated  Transportation Interventions Intervention Not Indicated, Patient Resources (Friends/Family)  [patient understands needsto go to [facility]  to be evaluated by an occupational therapist affiliated with DMV  to resume  driving. location of stroke right occipital lobe stroke  increasese risk of  MVA due to  less easily perceived  traffic in  left field]      Interventions Today    Flowsheet Row Most Recent Value  General Interventions   General Interventions Discussed/Reviewed General Interventions Discussed, General Interventions Reviewed, Doctor Visits  Doctor Visits Discussed/Reviewed Doctor Visits Discussed, Doctor Visits Reviewed  Pharmacy Interventions   Pharmacy Dicussed/Reviewed Pharmacy Topics Discussed      TOC Interventions Today    Flowsheet Row Most Recent Value  TOC Interventions   TOC Interventions Discussed/Reviewed TOC Interventions Discussed, TOC Interventions Reviewed  [Discussed driving]       Gean Maidens BSN RN [facility] [phone]

## 2022-09-24 ENCOUNTER — Telehealth: Payer: Self-pay | Admitting: Internal Medicine

## 2022-09-24 NOTE — Telephone Encounter (Signed)
Pt called stating when he was recently in the hosp for a stroke, Dr. Alphonsus Sias gave him a call. Pt states he needs another call with Dr. Alphonsus Sias to discuss advice on what to do as far as him driving? Call back # [phone]

## 2022-09-25 NOTE — Telephone Encounter (Signed)
Reviewed the discharge summary--which recommended formal evaluation Told him to call [facility] to set this up

## 2022-09-30 DIAGNOSIS — I6389 Other cerebral infarction: Secondary | ICD-10-CM | POA: Diagnosis not present

## 2022-10-03 ENCOUNTER — Encounter: Payer: Self-pay | Admitting: Internal Medicine

## 2022-10-03 ENCOUNTER — Ambulatory Visit (INDEPENDENT_AMBULATORY_CARE_PROVIDER_SITE_OTHER): Payer: Medicare HMO | Admitting: Internal Medicine

## 2022-10-03 VITALS — BP 122/84 | HR 70 | Temp 97.9°F | Ht 71.25 in | Wt 183.0 lb

## 2022-10-03 DIAGNOSIS — I2510 Atherosclerotic heart disease of native coronary artery without angina pectoris: Secondary | ICD-10-CM

## 2022-10-03 DIAGNOSIS — Z0000 Encounter for general adult medical examination without abnormal findings: Secondary | ICD-10-CM | POA: Diagnosis not present

## 2022-10-03 DIAGNOSIS — K219 Gastro-esophageal reflux disease without esophagitis: Secondary | ICD-10-CM

## 2022-10-03 DIAGNOSIS — Z8673 Personal history of transient ischemic attack (TIA), and cerebral infarction without residual deficits: Secondary | ICD-10-CM

## 2022-10-03 DIAGNOSIS — I10 Essential (primary) hypertension: Secondary | ICD-10-CM | POA: Diagnosis not present

## 2022-10-03 MED ORDER — PANTOPRAZOLE SODIUM 40 MG PO TBEC: 40.0000 mg | DELAYED_RELEASE_TABLET | Freq: Every day | ORAL | 0 refills | Status: DC

## 2022-10-03 NOTE — Assessment & Plan Note (Signed)
Not clear cut Will reduce pantoprazole to daily---and then consider going to every other day

## 2022-10-03 NOTE — Progress Notes (Signed)
Subjective:    Patient ID: Justin Terrell, male    DOB: 01-13-1956, 67 y.o.   MRN: [id number]  HPI Here for Medicare wellness visit and hospital follow up Reviewed advanced directives Reviewed other doctors--Dr Paraschos--cardiology, Dr Winn Jock, Dr Alexandria Lodge, Dr Robyne Askew, Ginette Otto derm Was just admitted with a stroke--no surgery in the past year Still smokes occasionally. Tried hypnosis--didn't help. Didn't like gum Occasional alcohol Had been walking regularly--not as much lately.  Vision basically okay Hearing is fine No falls No depression or anhedonia Independent with instrumental ADLs No memory problems  Recent stroke with visual field problems (occipital) Did see opto and was cleared to drive--has small loss of upper outer visual fields Dr Alvester Morin thinks he will get this back completely over time No other manifestations Is on high dose atorvastatin--has diarrhea   Had chest pain also Not felt to be ischemic Zio monitor is pending due to mild ectopy Echo repeated---EF was back to normal Rare SOB--with exertion. Resolves quickly Still gets sporadic brief pain--since home also Doesn't have palpitations---but has the pain and he sits up and it goes away No edema  He doesn't have GERD that he knows of--though he has had past chest pain attributed to this Was put on twice a day for now  Current Outpatient Medications on File Prior to Visit  Medication Sig Dispense Refill   aspirin EC 81 MG tablet Take 81 mg by mouth daily.     atorvastatin (LIPITOR) 80 MG tablet Take 1 tablet (80 mg total) by mouth every evening. 30 tablet 2   clopidogrel (PLAVIX) 75 MG tablet Take 1 tablet (75 mg total) by mouth daily. 30 tablet 2   metoprolol tartrate (LOPRESSOR) 25 MG tablet Take 1 tablet (25 mg total) by mouth 2 (two) times daily. 60 tablet 1   pantoprazole (PROTONIX) 40 MG tablet Take 1 tablet (40 mg total) by mouth 2 (two) times daily before a meal. 60 tablet 3    valsartan (DIOVAN) 160 MG tablet TAKE 1 TABLET(160 MG) BY MOUTH DAILY 90 tablet 3   No current facility-administered medications on file prior to visit.    Allergies  Allergen Reactions   Codeine Nausea And Vomiting   Pravastatin Other (See Comments)    Reaction:  Chest pain     Past Medical History:  Diagnosis Date   AVM (arteriovenous malformation) of ascending colon 12/25/2012   Cancer (HCC)    Prostate   Coronary atherosclerosis of native coronary artery 05/26/2015   GERD (gastroesophageal reflux disease)    aspirin sensitive, prn meds only   Hyperlipidemia    Hypertension    Lumbar disc disease    Myocardial infarction [facility])    Nephrolithiasis    Personal history of colonic adenoma 12/30/2012   Ureteral duplication, right     Past Surgical History:  Procedure Laterality Date   ANGIOPLASTY     thigh   CARDIAC CATHETERIZATION N/A 07/05/2014   Procedure: Left Heart Cath and Coronary Angiography;  Surgeon: Marcina Millard, MD;  Location: [facility] INVASIVE CV LAB;  Service: Cardiovascular;  Laterality: N/A;   DOPPLER ECHOCARDIOGRAPHY  06/24/07   stress echo normal. Nl baseline EF   HAND NERVE REPAIR  1970's   both hands   KNEE ARTHROSCOPY  1990's   right    LUMBAR DISC SURGERY  1993   PROSTATE SURGERY  05/2017   robotic prostatectomy   Right leg compound fracture repair  1976    Family History  Problem Relation Age of Onset  Cancer Mother        colon cancer   Colon cancer Mother 14   Throat cancer Mother    Esophageal cancer Mother    Dementia Mother    Heart disease Father    Hypertension Father    Coronary artery disease Father    Valvular heart disease Father    Diabetes Neg Hx    Prostate cancer Neg Hx    Kidney cancer Neg Hx    Rectal cancer Neg Hx    Stomach cancer Neg Hx    Lung disease Neg Hx     Social History   Socioeconomic History   Marital status: Married    Spouse name: Not on file   Number of children: 0   Years of education: Not  on file   Highest education level: Not on file  Occupational History   Occupation: Information systems    Employer: [employer]    Comment: Retired  Tobacco Use   Smoking status: Some Days    Current packs/day: 0.00    Average packs/day: 0.5 packs/day for 50.0 years (25.0 ttl pk-yrs)    Types: Cigarettes    Start date: 02/26/1959    Last attempt to quit: 02/25/2009    Years since quitting: 13.6    Passive exposure: Past (as a child)   Smokeless tobacco: Never   Tobacco comments:    Less then 1 pack a day [facility] 08/06/22  Vaping Use   Vaping status: Never Used  Substance and Sexual Activity   Alcohol use: No    Alcohol/week: 0.0 standard drinks of alcohol   Drug use: No   Sexual activity: Not on file  Other Topics Concern   Not on file  Social History Narrative   2nd marriage   Wife has 2 children (1 died)   Works in IT      Has living will   [employer], have health care POA   Would accept resuscitation   No feeding tube if cognitively unaware   Social Determinants of Health   Financial Resource Strain: Not on file  Food Insecurity: No Food Insecurity (09/20/2022)   Hunger Vital Sign    Worried About Running Out of Food in the Last Year: Never true    Ran Out of Food in the Last Year: Never true  Transportation Needs: No Transportation Needs (09/20/2022)   PRAPARE - Administrator, Civil Service (Medical): No    Lack of Transportation (Non-Medical): No  Physical Activity: Not on file  Stress: Not on file  Social Connections: Not on file  Intimate Partner Violence: Patient Declined (09/17/2022)   Humiliation, Afraid, Rape, and Kick questionnaire    Fear of Current or Ex-Partner: Patient declined    Emotionally Abused: Patient declined    Physically Abused: Patient declined    Sexually Abused: Patient declined   Review of Systems Appetite is good Weight is stable Sleeps okay Wears seat belt Teeth are okay--hasn't seen dentist  (discussed) Has some spots on skin to be removed---did have cryotherapy as well Bowels are liquidy---multiple times a day. No blood Voids okay--stream okay. Nocturia x 1 Sore back and arms recently     Objective:   Physical Exam Constitutional:      Appearance: Normal appearance.  HENT:     Mouth/Throat:     Pharynx: No oropharyngeal exudate or posterior oropharyngeal erythema.  Eyes:     Conjunctiva/sclera: Conjunctivae normal.     Pupils: Pupils are equal,  round, and reactive to light.  Cardiovascular:     Rate and Rhythm: Normal rate and regular rhythm.     Heart sounds: No murmur heard.    No gallop.     Comments: Faint pedal pulses Pulmonary:     Effort: Pulmonary effort is normal.     Breath sounds: Normal breath sounds. No wheezing or rales.  Abdominal:     Palpations: Abdomen is soft.     Tenderness: There is no abdominal tenderness.  Musculoskeletal:     Cervical back: Neck supple.     Right lower leg: No edema.     Left lower leg: No edema.  Lymphadenopathy:     Cervical: No cervical adenopathy.  Skin:    Findings: No lesion or rash.  Neurological:     General: No focal deficit present.     Mental Status: He is alert and oriented to person, place, and time.     Comments: Word naming --- 13/less than 1 minute Recall 2/3  Psychiatric:        Mood and Affect: Mood normal.        Behavior: Behavior normal.            Assessment & Plan:

## 2022-10-03 NOTE — Progress Notes (Signed)
Hearing Screening - Comments:: Passed whisper test Vision Screening - Comments:: August 2024

## 2022-10-03 NOTE — Assessment & Plan Note (Signed)
I have personally reviewed the Medicare Annual Wellness questionnaire and have noted 1. The patient's medical and social history 2. Their use of alcohol, tobacco or illicit drugs 3. Their current medications and supplements 4. The patient's functional ability including ADL's, fall risks, home safety risks and hearing or visual             impairment. 5. Diet and physical activities 6. Evidence for depression or mood disorders  The patients weight, height, BMI and visual acuity have been recorded in the chart I have made referrals, counseling and provided education to the patient based review of the above and I have provided the pt with a written personalized care plan for preventive services.  I have provided you with a copy of your personalized plan for preventive services. Please take the time to review along with your updated medication list.  Needs to stop all cigarettes Has to get back to regular exercise Due for colon at the end of the year Needs updated COVID/flu in the fall---also one time RSV

## 2022-10-03 NOTE — Patient Instructions (Signed)
Please stop all cigarettes!! Reduce the pantoprazole to daily--and if nothing changes in a month, you can cut it to every other day. Try reducing the atorvastatin to 1/2 tab daily--to see if that helps the diarrhea

## 2022-10-03 NOTE — Assessment & Plan Note (Signed)
Has vague--mostly positional symptoms On statin, DAPT, valsartan 160mg  and metoprolol 25 bid

## 2022-10-03 NOTE — Assessment & Plan Note (Signed)
BP Readings from Last 3 Encounters:  10/03/22 122/84  09/19/22 (!) 172/79  08/06/22 128/82   Controlled on heart meds

## 2022-10-03 NOTE — Assessment & Plan Note (Signed)
Minimal vision residual Cleared by opto to drive On asa/plavix and statin (may need to reduce dose due to diarrhea and muscle aching)

## 2022-10-07 ENCOUNTER — Encounter: Payer: Self-pay | Admitting: Internal Medicine

## 2022-10-07 ENCOUNTER — Ambulatory Visit (INDEPENDENT_AMBULATORY_CARE_PROVIDER_SITE_OTHER): Payer: Medicare HMO | Admitting: Internal Medicine

## 2022-10-07 VITALS — BP 146/90 | HR 75 | Temp 97.4°F | Ht 71.0 in | Wt 185.2 lb

## 2022-10-07 DIAGNOSIS — F17200 Nicotine dependence, unspecified, uncomplicated: Secondary | ICD-10-CM

## 2022-10-07 DIAGNOSIS — R0789 Other chest pain: Secondary | ICD-10-CM | POA: Diagnosis not present

## 2022-10-07 DIAGNOSIS — R0782 Intercostal pain: Secondary | ICD-10-CM

## 2022-10-07 DIAGNOSIS — R0602 Shortness of breath: Secondary | ICD-10-CM | POA: Diagnosis not present

## 2022-10-07 NOTE — Progress Notes (Signed)
Justin Terrell    [id number]    March 03, 1955  Primary Care Physician:Letvak, Berneda Rose, MD Date of Appointment: 10/07/2022 Established Patient Visit  Chief complaint:   Chief Complaint  Patient presents with   Follow-up     HPI: Justin Terrell is a 67 y.o. man with tobacco use disorder and chronic cough.   Interval Updates: Here for follow up. Was started on flonase and PPI for chronic cough related to post nasal drainage and GERD (especially with chest pressure after eating.)  Was hospitalized Last month for acute PCA infarct, started on DAPT. Had chest pain for which cardiology recommended medical management. Having some residual vision deficits, no motor weakness.   Still having chest pain. Has been told by PCP that that his chest pain is not related to GERD and is now being weaned off.   Saw cardiology in the hospital and he is being place on a zio monitor due to having some ectopic beats.   Has been more short of breath with exertion as well.   He says he has cut back on smoking - has cut back to less than 1/2 ppd.    Has tried his wife's albuterol but it hasn't helped the chest pain.  I have reviewed the patient's family social and past medical history and updated as appropriate.   Past Medical History:  Diagnosis Date   AVM (arteriovenous malformation) of ascending colon 12/25/2012   Cancer (HCC)    Prostate   Coronary atherosclerosis of native coronary artery 05/26/2015   GERD (gastroesophageal reflux disease)    aspirin sensitive, prn meds only   Hyperlipidemia    Hypertension    Lumbar disc disease    Myocardial infarction [facility])    Nephrolithiasis    Personal history of colonic adenoma 12/30/2012   Ureteral duplication, right     Past Surgical History:  Procedure Laterality Date   ANGIOPLASTY     thigh   CARDIAC CATHETERIZATION N/A 07/05/2014   Procedure: Left Heart Cath and Coronary Angiography;  Surgeon: Marcina Millard, MD;  Location:  [facility] INVASIVE CV LAB;  Service: Cardiovascular;  Laterality: N/A;   DOPPLER ECHOCARDIOGRAPHY  06/24/07   stress echo normal. Nl baseline EF   HAND NERVE REPAIR  1970's   both hands   KNEE ARTHROSCOPY  1990's   right    LUMBAR DISC SURGERY  1993   PROSTATE SURGERY  05/2017   robotic prostatectomy   Right leg compound fracture repair  1976    Family History  Problem Relation Age of Onset   Cancer Mother        colon cancer   Colon cancer Mother 39   Throat cancer Mother    Esophageal cancer Mother    Dementia Mother    Heart disease Father    Hypertension Father    Coronary artery disease Father    Valvular heart disease Father    Diabetes Neg Hx    Prostate cancer Neg Hx    Kidney cancer Neg Hx    Rectal cancer Neg Hx    Stomach cancer Neg Hx    Lung disease Neg Hx     Social History   Occupational History   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  10/07/2022 hfb  Vaping Use   Vaping status: Never Used  Substance and Sexual Activity   Alcohol use: No    Alcohol/week: 0.0 standard drinks of alcohol   Drug use: No   Sexual activity: Not on file     Physical Exam: Blood pressure (!) 146/90, pulse 75, temperature (!) 97.4 F (36.3 C), temperature source Oral, height 5\' 11"  (1.803 m), weight 185 lb 3.2 oz (84 kg), SpO2 98%.  Gen:      No acute distress ENT:  poor dentition, no nasal polyps, mucus membranes moist Lungs:    No increased respiratory effort, symmetric chest wall excursion, clear to auscultation bilaterally, no wheezes or crackles CV:         Regular rate and rhythm; no murmurs, rubs, or gallops.  No pedal edema   Data  Reviewed: Imaging: I have personally reviewed the chest xray July 2024 - shows hyperinflation  PFTs:      No data to display         I have personally reviewed the patient's PFTs and   Labs:  Immunization status: Immunization History  Administered Date(s) Administered   Fluad Quad(high Dose 65+) 11/07/2020   Influenza, Seasonal, Injecte, Preservative Fre 12/18/2014   Influenza,inj,Quad PF,6+ Mos 11/07/2018   Influenza-Unspecified 12/14/2017, 11/26/2019   PFIZER(Purple Top)SARS-COV-2 Vaccination 04/04/2019, 04/28/2019, 10/12/2019, 05/14/2020   Pneumococcal Conjugate-13 01/10/2018   Pneumococcal Polysaccharide-23 07/24/2021   Td 07/24/2021   Tdap 10/10/2011   Zoster Recombinant(Shingrix) 01/15/2020, 03/16/2020    External Records Personally Reviewed: cardiology, hospital stay.   Assessment:  Shortness of breath 50 pack year smoking history Chest pain, h/o CAD Recent ischemic stroke  Plan/Recommendations:  Full set of PFTs - to be done at [facility] Referral for lung cancer screening - they will call you to schedule this.   Good luck quitting smoking! I agree with nicotine patches. Let me know if you want to try something else like chantix or wellbutrin.  Agree with cardiac evaluation for chest pain  Agree with stopping PPI since no improvement in chest pain with this.    Smoking Cessation Counseling:  1. The patient is an everyday smoker and symptomatic due to the following condition copd, stroke, cad 2. The patient is currently contemplative in quitting smoking. 3. I advised patient to quit smoking. 4. We identified patient specific barriers to change.  5. I personally spent 4 minutes counseling the patient regarding tobacco use disorder. 6. We discussed management of stress and anxiety to help with smoking cessation, when applicable. 7. We discussed nicotine replacement therapy, Wellbutrin, Chantix as possible options. 8. I advised setting a quit date. 9.  Follow?up arranged with our office to continue ongoing discussions. 10.Resources given to patient including quit hotline.    Return to Care: Return in about 3 months (around 01/07/2023).   Durel Salts, MD Pulmonary and Critical Care Medicine [facility] Office:[phone]

## 2022-10-07 NOTE — Patient Instructions (Addendum)
Please schedule follow up scheduled with myself in 3 months.  If my schedule is not open yet, we will contact you with a reminder closer to that time. Please call [phone] if you haven't heard from [place] a month before.   Before your next visit I would like you to have: Full set of PFTs - to be done at [facility] Referral for lung cancer screening - they will call you to schedule this.   Good luck quitting smoking! I agree with nicotine patches. Let me know if you want to try something else like chantix or wellbutrin.   What are the benefits of quitting smoking? Quitting smoking can lower your chances of getting or dying from heart disease, lung disease, kidney failure, infection, or cancer. It can also lower your chances of getting osteoporosis, a condition that makes your bones weak. Plus, quitting smoking can help your skin look younger and reduce the chances that you will have problems with sex.  Quitting smoking will improve your health no matter how old you are, and no matter how long or how much you have smoked.  What should I do if I want to quit smoking? The letters in the word "START" can help you remember the steps to take: S = Set a quit date. T = Tell family, friends, and the people around you that you plan to quit. A = Anticipate or plan ahead for the tough times you'll face while quitting. R = Remove cigarettes and other tobacco products from your home, car, and work. T = Talk to your doctor about getting help to quit.  How can my doctor or nurse help? Your doctor or nurse can give you advice on the best way to quit. He or she can also put you in touch with counselors or other people you can call for support. Plus, your doctor or nurse can give you medicines to: ?Reduce your craving for cigarettes ?Reduce the unpleasant symptoms that happen when you stop smoking (called "withdrawal symptoms"). You can also get help from a free phone line (1-800-QUIT-NOW) or go online to  [employer].  What are the symptoms of withdrawal? The symptoms include: ?Trouble sleeping ?Being irritable, anxious or restless ?Getting frustrated or angry ?Having trouble thinking clearly  Some people who stop smoking become temporarily depressed. Some people need treatment for depression, such as counseling or antidepressant medicines. Depressed people might: ?No longer enjoy or care about doing the things they used to like to do ?Feel sad, down, hopeless, nervous, or cranky most of the day, almost every day ?Lose or gain weight ?Sleep too much or too little ?Feel tired or like they have no energy ?Feel guilty or like they are worth nothing ?Forget things or feel confused ?Move and speak more slowly than usual ?Act restless or have trouble staying still ?Think about death or suicide  If you think you might be depressed, see your doctor or nurse. Only someone trained in mental health can tell for sure if you are depressed. If you ever feel like you might hurt yourself, go straight to the nearest emergency department. Or you can call for an ambulance (in the [place] and [place], dial 9-1-1) or call your doctor or nurse right away and tell them it is an emergency. You can also reach the [place] National Suicide Prevention Lifeline at 716-106-7257 or http://hill.com/.  How do medicines help you stop smoking? Different medicines work in different ways: ?Nicotine replacement therapy eases withdrawal and reduces your body's craving  for nicotine, the main drug found in cigarettes. There are different forms of nicotine replacement, including skin patches, lozenges, gum, nasal sprays, and "puffers" or inhalers. Many can be bought without a prescription, while others might require one. ?Bupropion is a prescription medicine that reduces your desire to smoke. This medicine is sold under the brand names Zyban and Wellbutrin. It is also available in a generic version, which is cheaper  than brand name medicines. ?Varenicline (brand names: Chantix, Champix) is a prescription medicine that reduces withdrawal symptoms and cigarette cravings. If you think you'd like to take varenicline and you have a history of depression, anxiety, or heart disease, discuss this with your doctor or nurse before taking the medicine. Varenicline can also increase the effects of alcohol in some people. It's a good idea to limit drinking while you're taking it, at least until you know how it affects you.  How does counseling work? Counseling can happen during formal office visits or just over the phone. A counselor can help you: ?Figure out what triggers your smoking and what to do instead ?Overcome cravings ?Figure out what went wrong when you tried to quit before  What works best? Studies show that people have the best luck at quitting if they take medicines to help them quit and work with a Veterinary surgeon. It might also be helpful to combine nicotine replacement with one of the prescription medicines that help people quit. In some cases, it might even make sense to take bupropion and varenicline together.  What about e-cigarettes? Sometimes people wonder if using electronic cigarettes, or "e-cigarettes," might help them quit smoking. Using e-cigarettes is also called "vaping." Doctors do not recommend e-cigarettes in place of medicines and counseling. That's because e-cigarettes still contain nicotine as well as other substances that might be harmful. It's not clear how they can affect a person's health in the long term.  Will I gain weight if I quit? Yes, you might gain a few pounds. But quitting smoking will have a much more positive effect on your health than weighing a few pounds more. Plus, you can help prevent some weight gain by being more active and eating less. Taking the medicine bupropion might help control weight gain.   What else can I do to improve my chances of quitting? You can: ?Start  exercising. ?Stay away from smokers and places that you associate with smoking. If people close to you smoke, ask them to quit with you. ?Keep gum, hard candy, or something to put in your mouth handy. If you get a craving for a cigarette, try one of these instead. ?Don't give up, even if you start smoking again. It takes most people a few tries before they succeed.  What if I am pregnant and I smoke? If you are pregnant, it's really important for the health of your baby that you quit. Ask your doctor what options you have, and what is safest for your baby

## 2022-10-09 DIAGNOSIS — R002 Palpitations: Secondary | ICD-10-CM | POA: Diagnosis not present

## 2022-10-09 DIAGNOSIS — I25110 Atherosclerotic heart disease of native coronary artery with unstable angina pectoris: Secondary | ICD-10-CM | POA: Diagnosis not present

## 2022-10-09 DIAGNOSIS — I4729 Other ventricular tachycardia: Secondary | ICD-10-CM | POA: Diagnosis not present

## 2022-10-09 DIAGNOSIS — I639 Cerebral infarction, unspecified: Secondary | ICD-10-CM | POA: Diagnosis not present

## 2022-10-09 DIAGNOSIS — E785 Hyperlipidemia, unspecified: Secondary | ICD-10-CM | POA: Diagnosis not present

## 2022-10-09 DIAGNOSIS — I10 Essential (primary) hypertension: Secondary | ICD-10-CM | POA: Diagnosis not present

## 2022-10-16 DIAGNOSIS — D485 Neoplasm of uncertain behavior of skin: Secondary | ICD-10-CM | POA: Diagnosis not present

## 2022-10-16 DIAGNOSIS — L820 Inflamed seborrheic keratosis: Secondary | ICD-10-CM | POA: Diagnosis not present

## 2022-10-16 DIAGNOSIS — C44519 Basal cell carcinoma of skin of other part of trunk: Secondary | ICD-10-CM | POA: Diagnosis not present

## 2022-10-16 DIAGNOSIS — Z85828 Personal history of other malignant neoplasm of skin: Secondary | ICD-10-CM | POA: Diagnosis not present

## 2022-10-17 ENCOUNTER — Other Ambulatory Visit: Payer: Self-pay | Admitting: Internal Medicine

## 2022-10-17 DIAGNOSIS — F17200 Nicotine dependence, unspecified, uncomplicated: Secondary | ICD-10-CM

## 2022-10-18 ENCOUNTER — Other Ambulatory Visit: Payer: Self-pay | Admitting: *Deleted

## 2022-10-18 DIAGNOSIS — F17210 Nicotine dependence, cigarettes, uncomplicated: Secondary | ICD-10-CM

## 2022-10-18 DIAGNOSIS — Z87891 Personal history of nicotine dependence: Secondary | ICD-10-CM

## 2022-10-18 DIAGNOSIS — Z122 Encounter for screening for malignant neoplasm of respiratory organs: Secondary | ICD-10-CM

## 2022-10-23 DIAGNOSIS — I252 Old myocardial infarction: Secondary | ICD-10-CM | POA: Diagnosis not present

## 2022-10-23 DIAGNOSIS — I4729 Other ventricular tachycardia: Secondary | ICD-10-CM | POA: Diagnosis not present

## 2022-10-30 ENCOUNTER — Encounter: Payer: Self-pay | Admitting: Internal Medicine

## 2022-11-13 ENCOUNTER — Other Ambulatory Visit: Payer: Self-pay | Admitting: Internal Medicine

## 2022-11-14 DIAGNOSIS — H0012 Chalazion right lower eyelid: Secondary | ICD-10-CM | POA: Diagnosis not present

## 2022-11-19 DIAGNOSIS — I252 Old myocardial infarction: Secondary | ICD-10-CM | POA: Diagnosis not present

## 2022-11-19 DIAGNOSIS — I25110 Atherosclerotic heart disease of native coronary artery with unstable angina pectoris: Secondary | ICD-10-CM | POA: Diagnosis not present

## 2022-11-19 DIAGNOSIS — Z9889 Other specified postprocedural states: Secondary | ICD-10-CM | POA: Diagnosis not present

## 2022-11-19 DIAGNOSIS — R079 Chest pain, unspecified: Secondary | ICD-10-CM | POA: Diagnosis not present

## 2022-11-19 DIAGNOSIS — I10 Essential (primary) hypertension: Secondary | ICD-10-CM | POA: Diagnosis not present

## 2022-11-19 DIAGNOSIS — E785 Hyperlipidemia, unspecified: Secondary | ICD-10-CM | POA: Diagnosis not present

## 2022-11-19 DIAGNOSIS — R002 Palpitations: Secondary | ICD-10-CM | POA: Diagnosis not present

## 2022-11-20 ENCOUNTER — Ambulatory Visit: Payer: Medicare HMO | Admitting: Acute Care

## 2022-11-20 ENCOUNTER — Encounter: Payer: Self-pay | Admitting: Acute Care

## 2022-11-20 ENCOUNTER — Ambulatory Visit
Admission: RE | Admit: 2022-11-20 | Discharge: 2022-11-20 | Disposition: A | Discharge status: Home / Self Care | Payer: Medicare HMO | Source: Ambulatory Visit | Attending: Acute Care | Admitting: Acute Care

## 2022-11-20 DIAGNOSIS — Z87891 Personal history of nicotine dependence: Secondary | ICD-10-CM | POA: Diagnosis not present

## 2022-11-20 DIAGNOSIS — F17210 Nicotine dependence, cigarettes, uncomplicated: Secondary | ICD-10-CM

## 2022-11-20 DIAGNOSIS — Z122 Encounter for screening for malignant neoplasm of respiratory organs: Secondary | ICD-10-CM | POA: Insufficient documentation

## 2022-11-20 NOTE — Patient Instructions (Signed)

## 2022-11-20 NOTE — Progress Notes (Signed)
Virtual Visit via Telephone Note  I connected with Justin Terrell on 11/20/22 at  9:00 AM EDT by telephone and verified that I am speaking with the correct person using two identifiers.  Location: Patient:  At home Provider: [address] [place], [place], [address]    I discussed the limitations, risks, security and privacy concerns of performing an evaluation and management service by telephone and the availability of in person appointments. I also discussed with the patient that there may be a patient responsible charge related to this service. The patient expressed understanding and agreed to proceed.   Shared Decision Making Visit Lung Cancer Screening Program [phone])   Eligibility: Age 67 y.o. Pack Years Smoking History Calculation 63.7 pack year smoking history (# packs/per year x # years smoked) Recent History of coughing up blood  no Unexplained weight loss? no ( >Than 15 pounds within the last 6 months ) Prior History Lung / other cancer no (Diagnosis within the last 5 years already requiring surveillance chest CT Scans). Smoking Status Current Smoker Former Smokers: Years since quit:  NA  Quit Date:  NA  Visit Components: Discussion included one or more decision making aids. yes Discussion included risk/benefits of screening. yes Discussion included potential follow up diagnostic testing for abnormal scans. yes Discussion included meaning and risk of over diagnosis. yes Discussion included meaning and risk of False Positives. yes Discussion included meaning of total radiation exposure. yes  Counseling Included: Importance of adherence to annual lung cancer LDCT screening. yes Impact of comorbidities on ability to participate in the program. yes Ability and willingness to under diagnostic treatment. yes  Smoking Cessation Counseling: Current Smokers:  Discussed importance of smoking cessation. yes Information about tobacco cessation classes and  interventions provided to patient. yes Patient provided with "ticket" for LDCT Scan. yes Symptomatic Patient. no  Counseling NA Diagnosis Code: Tobacco Use Z72.0 Asymptomatic Patient yes  Counseling (Intermediate counseling: > three minutes counseling) [id number] Former Smokers:  Discussed the importance of maintaining cigarette abstinence. yes Diagnosis Code: Personal History of Nicotine Dependence. B28.413 Information about tobacco cessation classes and interventions provided to patient. Yes Patient provided with "ticket" for LDCT Scan. yes Written Order for Lung Cancer Screening with LDCT placed in Epic. Yes (CT Chest Lung Cancer Screening Low Dose W/O CM) [id number] Z12.2-Screening of respiratory organs Z87.891-Personal history of nicotine dependence  I have spent 25 minutes of face to face/ virtual visit   time with  Justin Terrell discussing the risks and benefits of lung cancer screening. We viewed / discussed a power point together that explained in detail the above noted topics. We paused at intervals to allow for questions to be asked and answered to ensure understanding.We discussed that the single most powerful action that he can take to decrease his risk of developing lung cancer is to quit smoking. We discussed whether or not he is ready to commit to setting a quit date. We discussed options for tools to aid in quitting smoking including nicotine replacement therapy, non-nicotine medications, support groups, Quit Smart classes, and behavior modification. We discussed that often times setting smaller, more achievable goals, such as eliminating 1 cigarette a day for a week and then 2 cigarettes a day for a week can be helpful in slowly decreasing the number of cigarettes smoked. This allows for a sense of accomplishment as well as providing a clinical benefit. I provided  him  with smoking cessation  information  with contact information for community resources, classes,  free nicotine replacement  therapy, and access to mobile apps, text messaging, and on-line smoking cessation help. I have also provided  him  the office contact information in the event he needs to contact me, or the screening staff. We discussed the time and location of the scan, and that either Abigail Miyamoto RN, Karlton Lemon, RN  or I will call / send a letter with the results within 24-72 hours of receiving them. The patient verbalized understanding of all of  the above and had no further questions upon leaving the office. They have my contact information in the event they have any further questions.  I spent 3-4 minutes counseling on smoking cessation and the health risks of continued tobacco abuse.  I explained to the patient that there has been a high incidence of coronary artery disease noted on these exams. I explained that this is a non-gated exam therefore degree or severity cannot be determined. This patient is on statin therapy. I have asked the patient to follow-up with their PCP regarding any incidental finding of coronary artery disease and management with diet or medication as their PCP  feels is clinically indicated. The patient verbalized understanding of the above and had no further questions upon completion of the visit.      Bevelyn Ngo, NP 11/20/2022

## 2022-11-21 ENCOUNTER — Ambulatory Visit: Payer: Medicare HMO | Attending: Internal Medicine

## 2022-11-21 DIAGNOSIS — J984 Other disorders of lung: Secondary | ICD-10-CM | POA: Diagnosis not present

## 2022-11-21 DIAGNOSIS — R059 Cough, unspecified: Secondary | ICD-10-CM | POA: Diagnosis present

## 2022-11-21 DIAGNOSIS — F17200 Nicotine dependence, unspecified, uncomplicated: Secondary | ICD-10-CM | POA: Diagnosis not present

## 2022-11-21 DIAGNOSIS — F17210 Nicotine dependence, cigarettes, uncomplicated: Secondary | ICD-10-CM | POA: Diagnosis not present

## 2022-11-21 LAB — PULMONARY FUNCTION TEST ARMC ONLY
DL/VA % pred: 53 %
DL/VA: 2.16 ml/min/mmHg/L
DLCO unc % pred: 45 %
DLCO unc: 12.68 ml/min/mmHg
FEF 25-75 Post: 0.9 L/sec
FEF 25-75 Pre: 0.79 L/sec
FEF2575-%Change-Post: 14 %
FEF2575-%Pred-Post: 31 %
FEF2575-%Pred-Pre: 27 %
FEV1-%Change-Post: 9 %
FEV1-%Pred-Post: 46 %
FEV1-%Pred-Pre: 42 %
FEV1-Post: 1.7 L
FEV1-Pre: 1.56 L
FEV1FVC-%Change-Post: 0 %
FEV1FVC-%Pred-Pre: 73 %
FEV6-%Change-Post: 9 %
FEV6-%Pred-Post: 65 %
FEV6-%Pred-Pre: 60 %
FEV6-Post: 3.04 L
FEV6-Pre: 2.78 L
FEV6FVC-%Change-Post: 0 %
FEV6FVC-%Pred-Post: 102 %
FEV6FVC-%Pred-Pre: 103 %
FVC-%Change-Post: 10 %
FVC-%Pred-Post: 64 %
FVC-%Pred-Pre: 58 %
FVC-Post: 3.14 L
FVC-Pre: 2.85 L
Post FEV1/FVC ratio: 54 %
Post FEV6/FVC ratio: 97 %
Pre FEV1/FVC ratio: 55 %
Pre FEV6/FVC Ratio: 98 %
RV % pred: 212 %
RV: 5.29 L
TLC % pred: 114 %
TLC: 8.49 L

## 2022-11-21 MED ORDER — ALBUTEROL SULFATE (2.5 MG/3ML) 0.083% IN NEBU
2.5000 mg | INHALATION_SOLUTION | Freq: Once | RESPIRATORY_TRACT | Status: AC
  Administered 2022-11-21: 2.5 mg via RESPIRATORY_TRACT
  Filled 2022-11-21: qty 3

## 2022-12-03 DIAGNOSIS — I6389 Other cerebral infarction: Secondary | ICD-10-CM | POA: Diagnosis not present

## 2022-12-04 ENCOUNTER — Other Ambulatory Visit: Payer: Self-pay | Admitting: Acute Care

## 2022-12-04 DIAGNOSIS — Z87891 Personal history of nicotine dependence: Secondary | ICD-10-CM

## 2022-12-04 DIAGNOSIS — F17210 Nicotine dependence, cigarettes, uncomplicated: Secondary | ICD-10-CM

## 2022-12-04 DIAGNOSIS — Z122 Encounter for screening for malignant neoplasm of respiratory organs: Secondary | ICD-10-CM

## 2023-01-13 ENCOUNTER — Ambulatory Visit: Payer: Medicare HMO | Admitting: Internal Medicine

## 2023-01-13 DIAGNOSIS — Z9889 Other specified postprocedural states: Secondary | ICD-10-CM | POA: Diagnosis not present

## 2023-01-13 DIAGNOSIS — R002 Palpitations: Secondary | ICD-10-CM | POA: Diagnosis not present

## 2023-01-13 DIAGNOSIS — E785 Hyperlipidemia, unspecified: Secondary | ICD-10-CM | POA: Diagnosis not present

## 2023-01-13 DIAGNOSIS — R079 Chest pain, unspecified: Secondary | ICD-10-CM | POA: Diagnosis not present

## 2023-01-13 DIAGNOSIS — I252 Old myocardial infarction: Secondary | ICD-10-CM | POA: Diagnosis not present

## 2023-01-13 DIAGNOSIS — I25110 Atherosclerotic heart disease of native coronary artery with unstable angina pectoris: Secondary | ICD-10-CM | POA: Diagnosis not present

## 2023-01-13 DIAGNOSIS — I10 Essential (primary) hypertension: Secondary | ICD-10-CM | POA: Diagnosis not present

## 2023-01-14 ENCOUNTER — Encounter: Payer: Self-pay | Admitting: Internal Medicine

## 2023-01-15 ENCOUNTER — Ambulatory Visit: Payer: Medicare HMO | Admitting: Internal Medicine

## 2023-01-15 ENCOUNTER — Encounter: Payer: Self-pay | Admitting: Internal Medicine

## 2023-01-15 VITALS — BP 142/84 | HR 75 | Temp 98.3°F | Ht 72.0 in | Wt 184.8 lb

## 2023-01-15 DIAGNOSIS — J4489 Other specified chronic obstructive pulmonary disease: Secondary | ICD-10-CM | POA: Diagnosis not present

## 2023-01-15 DIAGNOSIS — F17200 Nicotine dependence, unspecified, uncomplicated: Secondary | ICD-10-CM

## 2023-01-15 DIAGNOSIS — J439 Emphysema, unspecified: Secondary | ICD-10-CM

## 2023-01-15 DIAGNOSIS — F17210 Nicotine dependence, cigarettes, uncomplicated: Secondary | ICD-10-CM

## 2023-01-15 DIAGNOSIS — Z122 Encounter for screening for malignant neoplasm of respiratory organs: Secondary | ICD-10-CM

## 2023-01-15 MED ORDER — ALBUTEROL SULFATE HFA 108 (90 BASE) MCG/ACT IN AERS: 2.0000 | INHALATION_SPRAY | Freq: Four times a day (QID) | RESPIRATORY_TRACT | 5 refills | Status: DC | PRN

## 2023-01-15 MED ORDER — BEVESPI AEROSPHERE 9-4.8 MCG/ACT IN AERO
2.0000 | INHALATION_SPRAY | Freq: Two times a day (BID) | RESPIRATORY_TRACT | 5 refills | Status: DC
Start: 2023-01-15 — End: 2023-12-15

## 2023-01-15 NOTE — Progress Notes (Signed)
Justin Terrell    [id number]    1955-07-08  Primary Care Physician:Letvak, Berneda Rose, MD Date of Appointment: 01/15/2023 Established Patient Visit  Chief complaint:   Chief Complaint  Patient presents with   Follow-up    Review CT from 12/04/2022 and PFT from 11/21/2022.  Improved chest pain.     HPI: Justin Terrell is a 67 y.o. man with tobacco use disorder and COPD.   Interval Updates: Here for follow up after LDCT for lung cancer screening and pfts. Pfts show severe copd and his CT Chest shows no signs of lung cancer. His chest pain feels improved from a couple of months ago. He does have some chest pain at rest occasionally.  Wife notes continues to have early morning smokers cough with thick mucus production.   I have reviewed the patient's family social and past medical history and updated as appropriate.   Past Medical History:  Diagnosis Date   AVM (arteriovenous malformation) of ascending colon 12/25/2012   Cancer (HCC)    Prostate   Coronary atherosclerosis of native coronary artery 05/26/2015   GERD (gastroesophageal reflux disease)    aspirin sensitive, prn meds only   Hyperlipidemia    Hypertension    Lumbar disc disease    Myocardial infarction [facility])    Nephrolithiasis    Personal history of colonic adenoma 12/30/2012   Ureteral duplication, right     Past Surgical History:  Procedure Laterality Date   ANGIOPLASTY     thigh   CARDIAC CATHETERIZATION N/A 07/05/2014   Procedure: Left Heart Cath and Coronary Angiography;  Surgeon: Marcina Millard, MD;  Location: [facility] INVASIVE CV LAB;  Service: Cardiovascular;  Laterality: N/A;   DOPPLER ECHOCARDIOGRAPHY  06/24/07   stress echo normal. Nl baseline EF   HAND NERVE REPAIR  1970's   both hands   KNEE ARTHROSCOPY  1990's   right    LUMBAR DISC SURGERY  1993   PROSTATE SURGERY  05/2017   robotic prostatectomy   Right leg compound fracture repair  1976    Family History  Problem Relation Age  of Onset   Cancer Mother        colon cancer   Colon cancer Mother 42   Throat cancer Mother    Esophageal cancer Mother    Dementia Mother    Heart disease Father    Hypertension Father    Coronary artery disease Father    Valvular heart disease Father    Diabetes Neg Hx    Prostate cancer Neg Hx    Kidney cancer Neg Hx    Rectal cancer Neg Hx    Stomach cancer Neg Hx    Lung disease Neg Hx     Social History   Occupational History   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  10/07/2022 hfb  Vaping Use   Vaping status: Never Used  Substance and Sexual Activity   Alcohol use: No    Alcohol/week: 0.0 standard drinks of alcohol   Drug use: No   Sexual  activity: Not on file     Physical Exam: Blood pressure (!) 142/84, pulse 75, temperature 98.3 F (36.8 C), temperature source Oral, height 6' (1.829 m), weight 184 lb 12.8 oz (83.8 kg), SpO2 96%.  Gen:     No distress Lungs:   ctab no wheeze CV:         RRR   Data Reviewed: Imaging: I have personally reviewed the chest xray July 2024 - shows hyperinflation  PFTs:     Latest Ref Rng & Units 11/21/2022    9:30 AM  PFT Results  FVC-Pre L 2.85   FVC-Predicted Pre % 58   FVC-Post L 3.14   FVC-Predicted Post % 64   Pre FEV1/FVC % % 55   Post FEV1/FCV % % 54   FEV1-Pre L 1.56   FEV1-Predicted Pre % 42   FEV1-Post L 1.70   DLCO uncorrected ml/min/mmHg 12.68   DLCO UNC% % 45   DLVA Predicted % 53   TLC L 8.49   TLC % Predicted % 114   RV % Predicted % 212    I have personally reviewed the patient's PFTs which shows severe airflow limitation with +BD response and reduced dlco.    Labs:  Immunization status: Immunization History  Administered Date(s) Administered   Fluad Quad(high Dose 65+) 11/07/2020   Influenza, Seasonal, Injecte, Preservative Fre 12/18/2014   Influenza,inj,Quad PF,6+ Mos 11/07/2018   Influenza-Unspecified 12/14/2017, 11/26/2019, 10/29/2022   Moderna Sars-Covid-2 Vaccination 10/29/2022   PFIZER(Purple Top)SARS-COV-2 Vaccination 04/04/2019, 04/28/2019, 10/12/2019, 05/14/2020   Pneumococcal Conjugate-13 01/10/2018   Pneumococcal Polysaccharide-23 07/24/2021   RSV,unspecified 10/29/2022   Td 07/24/2021   Tdap 10/10/2011   Zoster Recombinant(Shingrix) 01/15/2020, 03/16/2020    External Records Personally Reviewed: cardiology, hospital stay.   Assessment:  COPD, Severe FEV1 42% of predicted 50 pack year smoking history, every day use S/p CVA in July 2024  Plan/Recommendations: You are next due for a CT scan for lung cancer screening in October 2025.   Start bevespi inhaler 2 puffs twice daily, gargle after use.   Take the albuterol rescue inhaler every 4 to 6 hours as needed for wheezing or shortness of breath.  Smoking Cessation Counseling:  1. The patient is an everyday smoker and symptomatic due to the following condition copd 2. The patient is currently pre-contemplative in quitting smoking. 3. I advised patient to quit smoking. 4. We identified patient specific barriers to change.  5. I personally spent 3  minutes counseling the patient regarding tobacco use disorder. 6. We discussed management of stress and anxiety to help with smoking cessation, when applicable. 7. We discussed nicotine replacement therapy, Wellbutrin, Chantix as possible options. 8. I advised setting a quit date. 9. Follow?up arranged with our office to continue ongoing discussions. 10.Resources given to patient including quit hotline.     Return to Care: Return in about 6 months (around 07/15/2023).   Durel Salts, MD Pulmonary and Critical Care  [facility] Office:[phone]

## 2023-01-15 NOTE — Progress Notes (Signed)
The patient has been prescribed the inhaler bevespi, albuterol. Inhaler technique was demonstrated to patient. The patient subsequently demonstrated correct technique.

## 2023-01-15 NOTE — Patient Instructions (Addendum)
It was a pleasure to see you today!  Please schedule follow up scheduled with myself in 6 months.  If my schedule is not open yet, we will contact you with a reminder closer to that time. Please call [phone] if you haven't heard from Justin Terrell a month before, and always call Justin Terrell sooner if issues or concerns arise. You can also send Justin Terrell a message through MyChart, but but aware that this is not to be used for urgent issues and it may take up to 5-7 days to receive a reply. Please be aware that you will likely be able to view your results before I have a chance to respond to them. Please give Justin Terrell 5 business days to respond to any non-urgent results.    You are next due for a CT scan for lung cancer screening in October 2025.   Start bevespi inhaler 2 puffs twice daily, gargle after use.   Take the albuterol rescue inhaler every 4 to 6 hours as needed for wheezing or shortness of breath. You can also take it 15 minutes before exercise or exertional activity. Side effects include heart racing or pounding, jitters or anxiety. If you have a history of an irregular heart rhythm, it can make this worse. Can also give some patients a hard time sleeping.  To inhale the aerosol using an inhaler, follow these steps:  Remove the protective dust cap from the end of the mouthpiece. If the dust cap was not placed on the mouthpiece, check the mouthpiece for dirt or other objects. Be sure that the canister is fully and firmly inserted in the mouthpiece. 2. If you are using the inhaler for the first time or if you have not used the inhaler in more than 14 days, you will need to prime it. You may also need to prime the inhaler if it has been dropped. Ask your pharmacist or check the manufacturer's information if this happens. To prime the inhaler, shake it well and then press down on the canister 4 times to release 4 sprays into the air, away from your face. Be careful not to get albuterol in your eyes. 3. Shake the  inhaler well. 4. Breathe out as completely as possible through your mouth. 4. Hold the canister with the mouthpiece on the bottom, facing you and the canister pointing upward. Place the open end of the mouthpiece into your mouth. Close your lips tightly around the mouthpiece. 6. Breathe in slowly and deeply through the mouthpiece.At the same time, press down once on the container to spray the medication into your mouth. 7. Try to hold your breath for 10 seconds. remove the inhaler, and breathe out slowly. 8. If you were told to use 2 puffs, wait 1 minute and then repeat steps 3-7. 9. Replace the protective cap on the inhaler. 10. Clean your inhaler regularly. Follow the manufacturer's directions carefully and ask your doctor or pharmacist if you have any questions about cleaning your inhaler.  Check the back of the inhaler to keep track of the total number of doses left on the inhaler.     Understanding COPD   What is COPD? COPD stands for chronic obstructive pulmonary (lung) disease. COPD is a general term used for several lung diseases.  COPD is an umbrella term and encompasses other  common diseases in this group like chronic bronchitis and emphysema. Chronic asthma may also be included in this group. While some patients with COPD have only chronic bronchitis or emphysema, most  patients have a combination of both.  You might hear these terms used in exchange for one another.   COPD adds to the work of the heart. Diseased lungs may reduce the amount of oxygen that goes to the blood. High blood pressure in blood vessels from the heart to the lungs makes it difficult for the heart to pump. Lung disease can also cause the body to produce too many red blood cells which may make the blood thicker and harder to pump.   Patients who have COPD with low oxygen levels may develop an enlarged heart (cor pulmonale). This condition weakens the heart and causes increased shortness of breath and swelling in  the legs and feet.   Chronic bronchitis Chronic bronchitis is irritation and inflammation (swelling) of the lining in the bronchial tubes (air passages). The irritation causes coughing and an excess amount of mucus in the airways. The swelling makes it difficult to get air in and out of the lungs. The small, hair-like structures on the inside of the airways (called cilia) may be damaged by the irritation. The cilia are then unable to help clean mucus from the airways.  Bronchitis is generally considered to be chronic when you have: a productive cough (cough up mucus) and shortness of breath that lasts about 3 months or more each year for 2 or more years in a row. Your doctor may define chronic bronchitis differently.   Emphysema Emphysema is the destruction, or breakdown, of the walls of the alveoli (air sacs) located at the end of the bronchial tubes. The damaged alveoli are not able to exchange oxygen and carbon dioxide between the lungs and the blood. The bronchioles lose their elasticity and collapse when you exhale, trapping air in the lungs. The trapped air keeps fresh air and oxygen from entering the lungs.   Who is affected by COPD? Emphysema and chronic bronchitis affect approximately 16 million people in the [place], or close to 11 percent of the population.   Symptoms of COPD  Shortness of breath  Shortness of breath with mild exercise (walking, using the stairs, etc.)  Chronic, productive cough (with mucus)  A feeling of "tightness" in the chest  Wheezing   What causes COPD? The two primary causes of COPD are cigarette smoking and alpha1-antitrypsin (AAT) deficiency. Air pollution and occupational dusts may also contribute to COPD, especially when the person exposed to these substances is a cigarette smoker.  Cigarette smoke causes COPD by irritating the airways and creating inflammation that narrows the airways, making it more difficult to breathe. Cigarette smoke also causes  the cilia to stop working properly so mucus and trapped particles are not cleaned from the airways. As a result, chronic cough and excess mucus production develop, leading to chronic bronchitis.  In some people, chronic bronchitis and infections can lead to destruction of the small airways, or emphysema.  AAT deficiency, an inherited disorder, can also lead to emphysema. Alpha antitrypsin (AAT) is a protective material produced in the liver and transported to the lungs to help combat inflammation. When there is not enough of the chemical AAT, the body is no longer protected from an enzyme in the white blood cells.   How is COPD diagnosed?  To diagnose COPD, the physician needs to know: Do you smoke?  Have you had chronic exposure to dust or air pollutants?  Do other members of your family have lung disease?  Are you short of breath?  Do you get short of breath  with exercise?  Do you have chronic cough and/or wheezing?  Do you cough up excess mucus?  To help with the diagnosis, the physician will conduct a thorough physical exam which includes:  Listening to your lungs and heart  Checking your blood pressure and pulse  Examining your nose and throat  Checking your feet and ankles for swelling   Laboratory and other tests Several laboratory and other tests are needed to confirm a diagnosis of COPD. These tests may include:  Chest X-ray to look for lung changes that could be caused by COPD   Spirometry and pulmonary function tests (PFTs) to determine lung volume and air flow  Pulse oximetry to measure the saturation of oxygen in the blood  Arterial blood gases (ABGs) to determine the amount of oxygen and carbon dioxide in the blood  Exercise testing to determine if the oxygen level in the blood drops during exercise   Treatment In the beginning stages of COPD, there is minimal shortness of breath that may be noticed only during exercise. As the disease progresses, shortness of breath may  worsen and you may need to wear an oxygen device.   To help control other symptoms of COPD, the following treatments and lifestyle changes may be prescribed.  Quitting smoking  Avoiding cigarette smoke and other irritants  Taking medications including: a. bronchodilators b. anti-inflammatory agents c. oxygen d. antibiotics  Maintaining a healthy diet  Following a structured exercise program such as pulmonary rehabilitation Preventing respiratory infections  Controlling stress   If your COPD progresses, you may be eligible to be evaluated for lung volume reduction surgery or lung transplantation. You may also be eligible to participate in certain clinical trials (research studies). Ask your health care providers about studies being conducted in your hospital.   What is the outlook? Although COPD can not be cured, its symptoms can be treated and your quality of life can be improved. Your prognosis or outlook for the future will depend on how well your lungs are functioning, your symptoms, and how well you respond to and follow your treatment plan.

## 2023-01-21 ENCOUNTER — Telehealth: Payer: Self-pay | Admitting: Internal Medicine

## 2023-01-21 MED ORDER — ATORVASTATIN CALCIUM 80 MG PO TABS: 80.0000 mg | ORAL_TABLET | Freq: Every evening | ORAL | 2 refills | Status: DC

## 2023-01-21 NOTE — Telephone Encounter (Signed)
Prescription Request  01/21/2023  LOV: 10/03/2022  What is the name of the medication or equipment?  atorvastatin (LIPITOR) 80 MG tablet  Have you contacted your pharmacy to request a refill? Yes   [id number] - [place], [place] - [address] AT [facility] OF [address] [place] [address] Phone: [phone] Fax: [phone]    Patient notified that their request is being sent to the clinical staff for review and that they should receive a response within 2 business days.   Please advise at Mobile [phone] (mobile)

## 2023-01-21 NOTE — Telephone Encounter (Signed)
Rx sent electronically 

## 2023-01-25 ENCOUNTER — Other Ambulatory Visit: Payer: Self-pay | Admitting: Internal Medicine

## 2023-02-11 ENCOUNTER — Other Ambulatory Visit: Payer: Self-pay | Admitting: Internal Medicine

## 2023-02-11 DIAGNOSIS — K219 Gastro-esophageal reflux disease without esophagitis: Secondary | ICD-10-CM

## 2023-03-17 DIAGNOSIS — H524 Presbyopia: Secondary | ICD-10-CM | POA: Diagnosis not present

## 2023-03-17 DIAGNOSIS — H25813 Combined forms of age-related cataract, bilateral: Secondary | ICD-10-CM | POA: Diagnosis not present

## 2023-04-21 DIAGNOSIS — Z9889 Other specified postprocedural states: Secondary | ICD-10-CM | POA: Diagnosis not present

## 2023-04-21 DIAGNOSIS — R002 Palpitations: Secondary | ICD-10-CM | POA: Diagnosis not present

## 2023-04-21 DIAGNOSIS — I25110 Atherosclerotic heart disease of native coronary artery with unstable angina pectoris: Secondary | ICD-10-CM | POA: Diagnosis not present

## 2023-04-21 DIAGNOSIS — I10 Essential (primary) hypertension: Secondary | ICD-10-CM | POA: Diagnosis not present

## 2023-04-21 DIAGNOSIS — E785 Hyperlipidemia, unspecified: Secondary | ICD-10-CM | POA: Diagnosis not present

## 2023-04-21 DIAGNOSIS — I252 Old myocardial infarction: Secondary | ICD-10-CM | POA: Diagnosis not present

## 2023-04-23 DIAGNOSIS — U071 COVID-19: Secondary | ICD-10-CM | POA: Diagnosis not present

## 2023-04-24 ENCOUNTER — Encounter: Payer: Self-pay | Admitting: Internal Medicine

## 2023-04-24 ENCOUNTER — Telehealth (INDEPENDENT_AMBULATORY_CARE_PROVIDER_SITE_OTHER): Payer: Medicare Other | Admitting: Internal Medicine

## 2023-04-24 VITALS — Temp 100.5°F

## 2023-04-24 DIAGNOSIS — U071 COVID-19: Secondary | ICD-10-CM | POA: Insufficient documentation

## 2023-04-24 MED ORDER — NIRMATRELVIR/RITONAVIR (PAXLOVID)TABLET: 3.0000 | ORAL_TABLET | Freq: Two times a day (BID) | ORAL | 0 refills | Status: AC

## 2023-04-24 MED ORDER — PREDNISONE 20 MG PO TABS: 40.0000 mg | ORAL_TABLET | Freq: Every day | ORAL | 0 refills | Status: DC

## 2023-04-24 NOTE — Progress Notes (Signed)
 Subjective:    Patient ID: Justin Terrell, male    DOB: [birth year], 68 y.o.   MRN: [id number]  HPI Video virtual visit due to COVID infection Identification done Reviewed limitations and billing and he gave consent Participants--patient in his home and I am in my office  Started feeling throat symptoms yesterday morning Then runny nose and cough Some fever Aches and pains Did COVID test last night--positive Called telehealth with [facility]---suggested paxlovid and prednisone but concerned about his other meds  Slight SOB--on breztri Hasn't used the albuterol Some wheezing  Took aspirin and cough med (it kept him up)--delsym  Current Outpatient Medications on File Prior to Visit  Medication Sig Dispense Refill   albuterol (VENTOLIN HFA) 108 (90 Base) MCG/ACT inhaler Inhale 2 puffs into the lungs every 6 (six) hours as needed. 18 g 5   aspirin EC 81 MG tablet Take 81 mg by mouth daily.     atorvastatin (LIPITOR) 80 MG tablet Take 1 tablet (80 mg total) by mouth every evening. 90 tablet 2   clopidogrel (PLAVIX) 75 MG tablet Take 1 tablet (75 mg total) by mouth daily. 30 tablet 2   Glycopyrrolate-Formoterol (BEVESPI AEROSPHERE) 9-4.8 MCG/ACT AERO Inhale 2 puffs into the lungs 2 (two) times daily. 10.7 g 5   metoprolol tartrate (LOPRESSOR) 25 MG tablet Take 1 tablet (25 mg total) by mouth 2 (two) times daily. 60 tablet 1   pantoprazole (PROTONIX) 40 MG tablet TAKE 1 TABLET(40 MG) BY MOUTH TWICE DAILY BEFORE A MEAL 180 tablet 2   valsartan (DIOVAN) 160 MG tablet TAKE 1 TABLET(160 MG) BY MOUTH DAILY 90 tablet 3   No current facility-administered medications on file prior to visit.    Allergies  Allergen Reactions   Codeine Nausea And Vomiting   Pravastatin Other (See Comments)    Reaction:  Chest pain     Past Medical History:  Diagnosis Date   AVM (arteriovenous malformation) of ascending colon 12/25/2012   Cancer (HCC)    Prostate   Coronary atherosclerosis of native coronary  artery 05/26/2015   GERD (gastroesophageal reflux disease)    aspirin sensitive, prn meds only   Hyperlipidemia    Hypertension    Lumbar disc disease    Myocardial infarction [facility])    Nephrolithiasis    Personal history of colonic adenoma 12/30/2012   Ureteral duplication, right     Past Surgical History:  Procedure Laterality Date   ANGIOPLASTY     thigh   CARDIAC CATHETERIZATION N/A 07/05/2014   Procedure: Left Heart Cath and Coronary Angiography;  Surgeon: Marcina Millard, MD;  Location: [facility] INVASIVE CV LAB;  Service: Cardiovascular;  Laterality: N/A;   DOPPLER ECHOCARDIOGRAPHY  06/24/07   stress echo normal. Nl baseline EF   HAND NERVE REPAIR  1970's   both hands   KNEE ARTHROSCOPY  1990's   right    LUMBAR DISC SURGERY  1993   PROSTATE SURGERY  05/2017   robotic prostatectomy   Right leg compound fracture repair  1976    Family History  Problem Relation Age of Onset   Cancer Mother        colon cancer   Colon cancer Mother 55   Throat cancer Mother    Esophageal cancer Mother    Dementia Mother    Heart disease Father    Hypertension Father    Coronary artery disease Father    Valvular heart disease Father    Diabetes Neg Hx    Prostate cancer  Neg Hx    Kidney cancer Neg Hx    Rectal cancer Neg Hx    Stomach cancer Neg Hx    Lung disease Neg Hx     Social History   Socioeconomic History   Marital status: Married    Spouse name: Not on file   Number of children: 0   Years of education: Not on file   Highest education level: Not on file  Occupational History   Occupation: Information systems    Employer: [employer]    Comment: Retired  Tobacco Use   Smoking status: Every Day    Current packs/day: 1.00    Average packs/day: 1 pack/day for 64.2 years (64.2 ttl pk-yrs)    Types: Cigarettes    Start date: 02/26/1959    Passive exposure: Past (as a child)   Smokeless tobacco: Never   Tobacco comments:    Less than 1/2 ppd.  10/07/2022 hfb  Vaping Use    Vaping status: Never Used  Substance and Sexual Activity   Alcohol use: No    Alcohol/week: 0.0 standard drinks of alcohol   Drug use: No   Sexual activity: Not on file  Other Topics Concern   Not on file  Social History Narrative   2nd marriage   Wife has 2 children (1 died)   Works in IT      Has living will   [employer], have health care POA   Would accept resuscitation   No feeding tube if cognitively unaware   Social Drivers of Corporate investment banker Strain: Not on file  Food Insecurity: No Food Insecurity (09/20/2022)   Hunger Vital Sign    Worried About Running Out of Food in the Last Year: Never true    Ran Out of Food in the Last Year: Never true  Transportation Needs: No Transportation Needs (09/20/2022)   PRAPARE - Administrator, Civil Service (Medical): No    Lack of Transportation (Non-Medical): No  Physical Activity: Not on file  Stress: Not on file  Social Connections: Not on file  Intimate Partner Violence: Patient Declined (09/17/2022)   Humiliation, Afraid, Rape, and Kick questionnaire    Fear of Current or Ex-Partner: Patient declined    Emotionally Abused: Patient declined    Physically Abused: Patient declined    Sexually Abused: Patient declined   Review of Systems No loss of smell or taste No N/V--other than with bad cough Able to eat Did have the vaccine this fall     Objective:   Physical Exam Constitutional:      Appearance: Normal appearance.  Pulmonary:     Effort: Pulmonary effort is normal. No respiratory distress.  Neurological:     Mental Status: He is alert.            Assessment & Plan:

## 2023-04-24 NOTE — Assessment & Plan Note (Signed)
 Looks okay but high risk with his lung disease Discussed using the albuterol and tylenol/ASA Paxlovid --normal GFR. Hold atorvastatin If worsens, to ER Will give prednisone 40mg  x 5, then 20mg  x 5 days

## 2023-05-06 ENCOUNTER — Encounter: Payer: Self-pay | Admitting: Family Medicine

## 2023-05-06 ENCOUNTER — Ambulatory Visit (INDEPENDENT_AMBULATORY_CARE_PROVIDER_SITE_OTHER): Admitting: Family Medicine

## 2023-05-06 VITALS — BP 130/82 | HR 87 | Temp 98.9°F | Ht 72.0 in | Wt 187.0 lb

## 2023-05-06 DIAGNOSIS — U071 COVID-19: Secondary | ICD-10-CM

## 2023-05-06 DIAGNOSIS — J449 Chronic obstructive pulmonary disease, unspecified: Secondary | ICD-10-CM | POA: Insufficient documentation

## 2023-05-06 DIAGNOSIS — J42 Unspecified chronic bronchitis: Secondary | ICD-10-CM | POA: Diagnosis not present

## 2023-05-06 MED ORDER — DOXYCYCLINE HYCLATE 100 MG PO TABS: 100.0000 mg | ORAL_TABLET | Freq: Two times a day (BID) | ORAL | 0 refills | Status: DC

## 2023-05-06 NOTE — Assessment & Plan Note (Signed)
 H/o this, on bevespi + albuterol inhaler.

## 2023-05-06 NOTE — Progress Notes (Signed)
 Ph: [phone] Fax: [phone]   Patient ID: Justin Terrell, male    DOB: 09/12/1955, 68 y.o.   MRN: [id number]  This visit was conducted in person.  BP 130/82   Pulse 87   Temp 98.9 F (37.2 C) (Oral)   Ht 6' (1.829 m)   Wt 187 lb (84.8 kg)   SpO2 98%   BMI 25.36 kg/m    CC: COVID f/u Subjective:   HPI: Justin Terrell is a 68 y.o. male presenting on 05/06/2023 for Covid Positive (2 weeks ago. Has had some improvement but still having cough, fever, chills, runny nose, weakness and SOB )    2 wks ago diagnosed with COVID infection, recent note reviewed. Symptoms started 04/23/2023. Treated with full dose Paxlovid + prednisone 10d taper. Never fully better.   Notes ongoing productive cough of thick white mucous, fever/chills, rhinorrhea with PNdrainage, dyspnea, wheezing and weakness/fatigue. Tmax 100 yesterday. Ongoing diarrhea/constipation. Notes R>L frontal sinus pressure/discomfort. Head > chest congestion. Cough is keeping him up at night   Increased cough productive of increased mucous production as well as increased dyspnea/wheeze.  Throat stays irritated.  Albuterol with limited benefit to dyspnea.   No nausea/vomiting, loss of taste/smell, leg swelling.   Tesslon perls haven't helped. OTC cough remedies paradoxically cause trouble sleeping.   Known COPD on Bevespi 1 puffs twice daily with albuterol PRN.  Known CAD on aspirin/plavix and lipitor      Relevant past medical, surgical, family and social history reviewed and updated as indicated. Interim medical history since our last visit reviewed. Allergies and medications reviewed and updated. Outpatient Medications Prior to Visit  Medication Sig Dispense Refill   albuterol (VENTOLIN HFA) 108 (90 Base) MCG/ACT inhaler Inhale 2 puffs into the lungs every 6 (six) hours as needed. 18 g 5   aspirin EC 81 MG tablet Take 81 mg by mouth daily.     atorvastatin (LIPITOR) 80 MG tablet Take 1 tablet (80 mg total) by  mouth every evening. (Patient taking differently: Take 80 mg by mouth every evening. 1/2 tab daily) 90 tablet 2   clopidogrel (PLAVIX) 75 MG tablet Take 1 tablet (75 mg total) by mouth daily. 30 tablet 2   Glycopyrrolate-Formoterol (BEVESPI AEROSPHERE) 9-4.8 MCG/ACT AERO Inhale 2 puffs into the lungs 2 (two) times daily. 10.7 g 5   metoprolol tartrate (LOPRESSOR) 25 MG tablet Take 1 tablet (25 mg total) by mouth 2 (two) times daily. 60 tablet 1   valsartan (DIOVAN) 160 MG tablet TAKE 1 TABLET(160 MG) BY MOUTH DAILY 90 tablet 3   pantoprazole (PROTONIX) 40 MG tablet TAKE 1 TABLET(40 MG) BY MOUTH TWICE DAILY BEFORE A MEAL (Patient taking differently: Take 40 mg by mouth daily.) 180 tablet 2   predniSONE (DELTASONE) 20 MG tablet Take 2 tablets (40 mg total) by mouth daily. For 5 days, then 1 tab daily for 5 days 15 tablet 0   No facility-administered medications prior to visit.     Per HPI unless specifically indicated in ROS section below Review of Systems  Objective:  BP 130/82   Pulse 87   Temp 98.9 F (37.2 C) (Oral)   Ht 6' (1.829 m)   Wt 187 lb (84.8 kg)   SpO2 98%   BMI 25.36 kg/m   Wt Readings from Last 3 Encounters:  05/06/23 187 lb (84.8 kg)  01/15/23 184 lb 12.8 oz (83.8 kg)  10/07/22 185 lb 3.2 oz (84 kg)      Physical  Exam Vitals and nursing note reviewed.  Constitutional:      Appearance: Normal appearance. He is not ill-appearing.  HENT:     Head: Normocephalic and atraumatic.     Right Ear: Hearing, tympanic membrane, ear canal and external ear normal. There is no impacted cerumen.     Left Ear: Hearing, tympanic membrane, ear canal and external ear normal. There is no impacted cerumen.     Nose: Congestion and rhinorrhea present.     Right Sinus: Frontal sinus tenderness present. No maxillary sinus tenderness.     Left Sinus: Frontal sinus tenderness present. No maxillary sinus tenderness.     Mouth/Throat:     Comments: Wearing mask Eyes:     Extraocular  Movements: Extraocular movements intact.     Conjunctiva/sclera: Conjunctivae normal.     Pupils: Pupils are equal, round, and reactive to light.  Cardiovascular:     Rate and Rhythm: Normal rate and regular rhythm.     Pulses: Normal pulses.     Heart sounds: Normal heart sounds. No murmur heard. Pulmonary:     Effort: Pulmonary effort is normal. No respiratory distress.     Breath sounds: No wheezing, rhonchi or rales.     Comments:  Coarse crackles R>L lower lobes  Musculoskeletal:     Cervical back: Normal range of motion and neck supple. No rigidity.     Right lower leg: No edema.     Left lower leg: No edema.  Lymphadenopathy:     Cervical: No cervical adenopathy.  Skin:    General: Skin is warm and dry.     Findings: No rash.  Neurological:     Mental Status: He is alert.  Psychiatric:        Mood and Affect: Mood normal.        Behavior: Behavior normal.        Assessment & Plan:   Problem List Items Addressed This Visit     COVID-19 virus infection - Primary   Diagnosed ~2wks ago, s/p full dose Paxlovid antiviral treatment and prednisone 10d taper with persistent symptoms. Not consistent with rebound COVID.  Notes increased cough, increased sputum production, increased dyspnea/wheeze.  Lung exam overall reassuring today.  In COPD and CAD hx, will empirically cover for COPD exacerbation component with doxycycline antibiotic.  He declines cough syrup which tends to paradoxically keep him awake.  Update if not improving with this treatment.       COPD (chronic obstructive pulmonary disease) (HCC)   H/o this, on bevespi + albuterol inhaler.         Meds ordered this encounter  Medications   doxycycline (VIBRA-TABS) 100 MG tablet    Sig: Take 1 tablet (100 mg total) by mouth 2 (two) times daily.    Dispense:  20 tablet    Refill:  0    No orders of the defined types were placed in this encounter.   Patient Instructions  Take doxycycline antibiotic 10  day course sent to pharmacy (photosensitivity precautions) Continue bevespi controller inhaler daily, albuterol rescue inhaler as needed, push fluids and rest  Let us know if fever >101, worsening productive cough not improving.   Follow up plan: Return if symptoms worsen or fail to improve.  Eustaquio Boyden, MD

## 2023-05-06 NOTE — Assessment & Plan Note (Addendum)
 Diagnosed ~2wks ago, s/p full dose Paxlovid antiviral treatment and prednisone 10d taper with persistent symptoms. Not consistent with rebound COVID.  Notes increased cough, increased sputum production, increased dyspnea/wheeze.  Lung exam overall reassuring today.  In COPD and CAD hx, will empirically cover for COPD exacerbation component with doxycycline antibiotic.  He declines cough syrup which tends to paradoxically keep him awake.  Update if not improving with this treatment.

## 2023-05-06 NOTE — Patient Instructions (Addendum)
 Take doxycycline antibiotic 10 day course sent to pharmacy (photosensitivity precautions) Continue bevespi controller inhaler daily, albuterol rescue inhaler as needed, push fluids and rest  Let us know if fever >101, worsening productive cough not improving.

## 2023-06-17 DIAGNOSIS — I6389 Other cerebral infarction: Secondary | ICD-10-CM | POA: Diagnosis not present

## 2023-07-07 ENCOUNTER — Encounter (HOSPITAL_COMMUNITY): Payer: Self-pay

## 2023-07-14 ENCOUNTER — Ambulatory Visit

## 2023-07-14 VITALS — BP 130/82 | Ht 72.0 in | Wt 185.0 lb

## 2023-07-14 DIAGNOSIS — Z5320 Procedure and treatment not carried out because of patient's decision for unspecified reasons: Secondary | ICD-10-CM | POA: Diagnosis not present

## 2023-07-14 DIAGNOSIS — Z2821 Immunization not carried out because of patient refusal: Secondary | ICD-10-CM

## 2023-07-14 DIAGNOSIS — Z0000 Encounter for general adult medical examination without abnormal findings: Secondary | ICD-10-CM | POA: Diagnosis not present

## 2023-07-14 NOTE — Patient Instructions (Signed)
 Justin Terrell , Thank you for taking time out of your busy schedule to complete your Annual Wellness Visit with me. I enjoyed our conversation and look forward to speaking with you again next year. I, as well as your care team,  appreciate your ongoing commitment to your health goals. Please review the following plan we discussed and let me know if I can assist you in the future. Your Game plan/ To Do List    Referrals: If you haven't heard from the office you've been referred to, please reach out to them at the phone provided.   Follow up Visits: Next Medicare AWV with our clinical staff: 07/15/2024   Have you seen your provider in the last 6 months (3 months if uncontrolled diabetes)? Yes Next Office Visit with your provider: 10/08/2023  Clinician Recommendations:  Aim for 30 minutes of exercise or brisk walking, 6-8 glasses of water, and 5 servings of fruits and vegetables each day.       This is a list of the screening recommended for you and due dates:  Health Maintenance  Topic Date Due   Hepatitis C Screening  Never done   COVID-19 Vaccine (6 - 2024-25 season) 12/24/2022   Flu Shot  09/26/2023   Screening for Lung Cancer  11/20/2023   Medicare Annual Wellness Visit  07/13/2024   Colon Cancer Screening  02/11/2025   DTaP/Tdap/Td vaccine (3 - Td or Tdap) 07/25/2031   Pneumonia Vaccine  Completed   Zoster (Shingles) Vaccine  Completed   HPV Vaccine  Aged Out   Meningitis B Vaccine  Aged Out    Advanced directives: (Copy Requested) Please bring a copy of your health care power of attorney and living will to the office to be added to your chart at your convenience. You can mail to [facility] [address] [place], [place] [address] or email to [email or web address] Advance Care Planning is important because it:  [x]  Makes sure you receive the medical care that is consistent with your values, goals, and preferences  [x]  It provides guidance to your family and  loved ones and reduces their decisional burden about whether or not they are making the right decisions based on your wishes.  Follow the link provided in your after visit summary or read over the paperwork we have mailed to you to help you started getting your Advance Directives in place. If you need assistance in completing these, please reach out to us  so that we can help you!  See attachments for Preventive Care and Fall Prevention Tips.

## 2023-07-14 NOTE — Progress Notes (Signed)
 Because this visit was a virtual/telehealth visit,  certain criteria was not obtained, such a blood pressure, CBG if applicable, and timed get up and go. Any medications not marked as "taking" were not mentioned during the medication reconciliation part of the visit. Any vitals not documented were not able to be obtained due to this being a telehealth visit or patient was unable to self-report a recent blood pressure reading due to a lack of equipment at home via telehealth. Vitals that have been documented are verbally provided by the patient.   This visit was performed by a medical professional under my direct supervision. I was immediately available for consultation/collaboration. I have reviewed and agree with the Annual Wellness Visit documentation.  Subjective:   Justin Terrell is a 68 y.o. who presents for a Medicare Wellness preventive visit.  As a reminder, Annual Wellness Visits don't include a physical exam, and some assessments may be limited, especially if this visit is performed virtually. We may recommend an in-person follow-up visit with your provider if needed.  Visit Complete: Virtual I connected with  Justin Terrell on 07/14/23 by a audio enabled telemedicine application and verified that I am speaking with the correct person using two identifiers.  Patient Location: Home  Provider Location: Home Office  I discussed the limitations of evaluation and management by telemedicine. The patient expressed understanding and agreed to proceed.  Vital Signs: Because this visit was a virtual/telehealth visit, some criteria may be missing or patient reported. Any vitals not documented were not able to be obtained and vitals that have been documented are patient reported.  VideoDeclined- This patient declined Librarian, academic. Therefore the visit was completed with audio only.  Persons Participating in Visit: Patient.  AWV Questionnaire: No: Patient  Medicare AWV questionnaire was not completed prior to this visit.  Cardiac Risk Factors include: advanced age (>74men, >41 women);male gender;Other (see comment), Risk factor comments: copd     Objective:     Today's Vitals   07/14/23 1503  BP: 130/82  Weight: 185 lb (83.9 kg)  Height: 6' (1.829 m)   Body mass index is 25.09 kg/m.     07/14/2023    3:07 PM 09/17/2022    9:21 AM 10/04/2020   11:28 AM 02/11/2018    1:15 PM 10/06/2017    4:31 PM 07/04/2014    9:40 PM  Advanced Directives  Does Patient Have a Medical Advance Directive? Yes No Yes Yes Yes No  Type of Estate agent of [place];Living will  Healthcare Power of [place];Living will     Does patient want to make changes to medical advance directive? No - Patient declined       Copy of Healthcare Power of Attorney in Chart? No - copy requested       Would patient like information on creating a medical advance directive?  No - Patient declined    No - patient declined information    Current Medications (verified) Outpatient Encounter Medications as of 07/14/2023  Medication Sig   albuterol  (VENTOLIN  HFA) 108 (90 Base) MCG/ACT inhaler Inhale 2 puffs into the lungs every 6 (six) hours as needed.   aspirin  EC 81 MG tablet Take 81 mg by mouth daily.   atorvastatin  (LIPITOR) 80 MG tablet Take 1 tablet (80 mg total) by mouth every evening. (Patient taking differently: Take 80 mg by mouth every evening. 1/2 tab daily)   clopidogrel  (PLAVIX ) 75 MG tablet Take 1 tablet (75 mg total)  by mouth daily.   Glycopyrrolate-Formoterol (BEVESPI  AEROSPHERE) 9-4.8 MCG/ACT AERO Inhale 2 puffs into the lungs 2 (two) times daily.   metoprolol  tartrate (LOPRESSOR ) 25 MG tablet Take 1 tablet (25 mg total) by mouth 2 (two) times daily.   pantoprazole  (PROTONIX ) 40 MG tablet TAKE 1 TABLET(40 MG) BY MOUTH TWICE DAILY BEFORE A MEAL (Patient taking differently: Take 40 mg by mouth daily.)   valsartan  (DIOVAN ) 160 MG tablet TAKE 1  TABLET(160 MG) BY MOUTH DAILY   doxycycline  (VIBRA -TABS) 100 MG tablet Take 1 tablet (100 mg total) by mouth 2 (two) times daily.   No facility-administered encounter medications on file as of 07/14/2023.    Allergies (verified) Codeine and Pravastatin   History: Past Medical History:  Diagnosis Date   AVM (arteriovenous malformation) of ascending colon 12/25/2012   Cancer (HCC)    Prostate   Coronary atherosclerosis of native coronary artery 05/26/2015   GERD (gastroesophageal reflux disease)    aspirin  sensitive, prn meds only   Hyperlipidemia    Hypertension    Lumbar disc disease    Myocardial infarction [facility])    Nephrolithiasis    Personal history of colonic adenoma 12/30/2012   Ureteral duplication, right    Past Surgical History:  Procedure Laterality Date   ANGIOPLASTY     thigh   CARDIAC CATHETERIZATION N/A 07/05/2014   Procedure: Left Heart Cath and Coronary Angiography;  Surgeon: Percival Brace, MD;  Location: [facility] INVASIVE CV LAB;  Service: Cardiovascular;  Laterality: N/A;   DOPPLER ECHOCARDIOGRAPHY  06/24/07   stress echo normal. Nl baseline EF   HAND NERVE REPAIR  1970's   both hands   KNEE ARTHROSCOPY  1990's   right    LUMBAR DISC SURGERY  1993   PROSTATE SURGERY  05/2017   robotic prostatectomy   Right leg compound fracture repair  1976   Family History  Problem Relation Age of Onset   Cancer Mother        colon cancer   Colon cancer Mother 86   Throat cancer Mother    Esophageal cancer Mother    Dementia Mother    Heart disease Father    Hypertension Father    Coronary artery disease Father    Valvular heart disease Father    Diabetes Neg Hx    Prostate cancer Neg Hx    Kidney cancer Neg Hx    Rectal cancer Neg Hx    Stomach cancer Neg Hx    Lung disease Neg Hx    Social History   Socioeconomic History   Marital status: Married    Spouse name: Not on file   Number of children: 0   Years of education: Not on file   Highest  education level: Bachelor's degree (e.g., BA, AB, BS)  Occupational History   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  10/07/2022 hfb  Vaping Use   Vaping status: Never Used  Substance and Sexual Activity   Alcohol use: No    Alcohol/week: 0.0 standard drinks of alcohol   Drug use: No   Sexual activity:  Not on file  Other Topics Concern   Not on file  Social History Narrative   2nd marriage   Wife has 2 children (1 died)   Works in IT      Has living will   [employer], have health care POA   Would accept resuscitation   No feeding tube if cognitively unaware   Social Drivers of Health   Financial Resource Strain: Low Risk  (07/14/2023)   Overall Financial Resource Strain (CARDIA)    Difficulty of Paying Living Expenses: Not hard at all  Food Insecurity: No Food Insecurity (07/14/2023)   Hunger Vital Sign    Worried About Running Out of Food in the Last Year: Never true    Ran Out of Food in the Last Year: Never true  Transportation Needs: No Transportation Needs (07/14/2023)   PRAPARE - Administrator, Civil Service (Medical): No    Lack of Transportation (Non-Medical): No  Physical Activity: Insufficiently Active (07/14/2023)   Exercise Vital Sign    Days of Exercise per Week: 1 day    Minutes of Exercise per Session: 20 min  Stress: No Stress Concern Present (07/14/2023)   [employer] of Occupational Health - Occupational Stress Questionnaire    Feeling of Stress : Not at all  Social Connections: Moderately Isolated (07/14/2023)   Social Connection and Isolation Panel [NHANES]    Frequency of Communication with  Friends and Family: More than three times a week    Frequency of Social Gatherings with Friends and Family: Once a week    Attends Religious Services: Never    Database administrator or Organizations: No    Attends Engineer, structural: Not on file    Marital Status: Married    Tobacco Counseling Ready to quit: Not Answered Counseling given: Not Answered Tobacco comments: Less than 1/2 ppd.  10/07/2022 hfb    Clinical Intake:  Pre-visit preparation completed: Yes  Pain : No/denies pain     BMI - recorded: 25.09 Nutritional Status: BMI 25 -29 Overweight Nutritional Risks: None Diabetes: No  Lab Results  Component Value Date   HGBA1C 5.6 09/17/2022   HGBA1C 6.2 (H) 10/16/2012     How often do you need to have someone help you when you read instructions, pamphlets, or other written materials from your doctor or pharmacy?: 1 - Never What is the last grade level you completed in school?: Degree  Interpreter Needed?: No  Information entered by :: Juliann Ochoa   Activities of Daily Living     07/14/2023    2:17 PM 05/06/2023    2:08 PM  In your present state of health, do you have any difficulty performing the following activities:  Hearing? 0 0  Vision? 0 0  Difficulty concentrating or making decisions? 0 0  Walking or climbing stairs? 0 0  Dressing or bathing? 0 0  Doing errands, shopping? 0 0  Preparing Food and eating ? N   Using the Toilet? N   In the past six months, have you accidently leaked urine? N   Do you have problems with loss of bowel control? N   Managing your Medications? N   Managing your Finances? N   Housekeeping or managing your Housekeeping? N     Patient Care Team: Helaine Llanos, MD as PCP - General (Pediatrics)  Indicate any recent Medical Services you may have received from other than [facility] providers in the past year (date may be  approximate).     Assessment:    This is a routine wellness examination for  Jj.  Hearing/Vision screen Hearing Screening - Comments:: No difficulties hearing Vision Screening - Comments:: Wear glasses   Goals Addressed             This Visit's Progress    Patient Stated       Would like to retire        Depression Screen     07/14/2023    3:08 PM 05/06/2023    2:08 PM 10/03/2022    9:14 AM 10/03/2022    8:49 AM 07/24/2021   10:51 AM 05/30/2020    9:27 AM 05/26/2019   11:39 AM  PHQ 2/9 Scores  PHQ - 2 Score 0 1 0 0 0 0 0  PHQ- 9 Score 0 3         Fall Risk     07/14/2023    2:17 PM 05/06/2023    2:07 PM 10/03/2022    9:14 AM 10/03/2022    8:48 AM  Fall Risk   Falls in the past year? 0 0 0 0  Number falls in past yr: 0 0  0  Injury with Fall? 0 0  0  Risk for fall due to :  No Fall Risks  No Fall Risks  Follow up Falls evaluation completed;Falls prevention discussed Falls evaluation completed  Falls evaluation completed    MEDICARE RISK AT HOME:  Medicare Risk at Home Any stairs in or around the home?: (Patient-Rptd) Yes If so, are there any without handrails?: (Patient-Rptd) No Home free of loose throw rugs in walkways, pet beds, electrical cords, etc?: (Patient-Rptd) No Adequate lighting in your home to reduce risk of falls?: (Patient-Rptd) Yes Life alert?: (Patient-Rptd) No Use of a cane, walker or w/c?: (Patient-Rptd) No Grab bars in the bathroom?: (Patient-Rptd) No Shower chair or bench in shower?: (Patient-Rptd) Yes Elevated toilet seat or a handicapped toilet?: (Patient-Rptd) No  TIMED UP AND GO:  Was the test performed?  No  Cognitive Function: 6CIT completed        07/14/2023    3:05 PM  6CIT Screen  What Year? 0 points  What month? 0 points  What time? 0 points  Count back from 20 0 points  Months in reverse 0 points  Repeat phrase 0 points  Total Score 0 points    Immunizations Immunization History  Administered Date(s) Administered   Fluad Quad(high Dose 65+) 11/07/2020   Influenza, Seasonal, Injecte,  Preservative Fre 12/18/2014   Influenza,inj,Quad PF,6+ Mos 11/07/2018   Influenza-Unspecified 12/14/2017, 11/26/2019, 10/29/2022   Moderna Sars-Covid-2 Vaccination 10/29/2022   PFIZER(Purple Top)SARS-COV-2 Vaccination 04/04/2019, 04/28/2019, 10/12/2019, 05/14/2020   Pneumococcal Conjugate-13 01/10/2018   Pneumococcal Polysaccharide-23 07/24/2021   RSV,unspecified 10/29/2022   Td 07/24/2021   Tdap 10/10/2011   Zoster Recombinant(Shingrix) 01/15/2020, 03/16/2020    Screening Tests Health Maintenance  Topic Date Due   Hepatitis C Screening  Never done   COVID-19 Vaccine (6 - 2024-25 season) 12/24/2022   INFLUENZA VACCINE  09/26/2023   Lung Cancer Screening  11/20/2023   Medicare Annual Wellness (AWV)  07/13/2024   Colonoscopy  02/11/2025   DTaP/Tdap/Td (3 - Td or Tdap) 07/25/2031   Pneumonia Vaccine 19+ Years old  Completed   Zoster Vaccines- Shingrix  Completed   HPV VACCINES  Aged Out   Meningococcal B Vaccine  Aged Out    Health Maintenance  Health Maintenance Due  Topic Date Due   Hepatitis C  Screening  Never done   COVID-19 Vaccine (6 - 2024-25 season) 12/24/2022   Health Maintenance Items Addressed:   Additional Screening:  Vision Screening: Recommended annual ophthalmology exams for early detection of glaucoma and other disorders of the eye.  Dental Screening: Recommended annual dental exams for proper oral hygiene  Community Resource Referral / Chronic Care Management: CRR required this visit?  No   CCM required this visit?  No   Plan:    I have personally reviewed and noted the following in the patient's chart:   Medical and social history Use of alcohol, tobacco or illicit drugs  Current medications and supplements including opioid prescriptions. Patient is not currently taking opioid prescriptions. Functional ability and status Nutritional status Physical activity Advanced directives List of other physicians Hospitalizations, surgeries, and ER  visits in previous 12 months Vitals Screenings to include cognitive, depression, and falls Referrals and appointments  In addition, I have reviewed and discussed with patient certain preventive protocols, quality metrics, and best practice recommendations. A written personalized care plan for preventive services as well as general preventive health recommendations were provided to patient.   Freeda Jerry, [place]   07/14/2023   After Visit Summary: (MyChart) Due to this being a telephonic visit, the after visit summary with patients personalized plan was offered to patient via MyChart   Notes: Nothing significant to report at this time.

## 2023-07-15 ENCOUNTER — Ambulatory Visit (INDEPENDENT_AMBULATORY_CARE_PROVIDER_SITE_OTHER): Admitting: Internal Medicine

## 2023-07-15 ENCOUNTER — Encounter: Payer: Self-pay | Admitting: Internal Medicine

## 2023-07-15 VITALS — BP 132/84 | HR 61 | Temp 97.7°F | Ht 72.0 in | Wt 187.0 lb

## 2023-07-15 DIAGNOSIS — R197 Diarrhea, unspecified: Secondary | ICD-10-CM | POA: Insufficient documentation

## 2023-07-15 MED ORDER — ROSUVASTATIN CALCIUM 10 MG PO TABS: 10.0000 mg | ORAL_TABLET | Freq: Every day | ORAL | 3 refills | Status: AC

## 2023-07-15 NOTE — Assessment & Plan Note (Signed)
 Does seem to be side effect from atorvastatin  Will stop this Try lower dose rosuvastatin 10mg  daily Avoid imodium

## 2023-07-15 NOTE — Progress Notes (Signed)
 Subjective:    Patient ID: Justin Terrell, male    DOB: [birth year], 68 y.o.   MRN: [id number]  HPI Here due to diarrhea  Happened 11 days ago 5 days in a row--2-3 in the morning quickly Some nausea before lunch--then better after eating Tried imodium the last 2 days and stopped the atorvastatin  No suspect foods and wife didn't have any symptoms No fever No abdominal pain No vomiting  3 days went by and no stool at all Went back to the atorvastatin  2 days ago--and went again yesterday and this morning Only taking 1/2 at a time Has been gassy every morning  No blood  Current Outpatient Medications on File Prior to Visit  Medication Sig Dispense Refill   albuterol  (VENTOLIN  HFA) 108 (90 Base) MCG/ACT inhaler Inhale 2 puffs into the lungs every 6 (six) hours as needed. 18 g 5   aspirin  EC 81 MG tablet Take 81 mg by mouth daily.     atorvastatin  (LIPITOR) 80 MG tablet Take 1 tablet (80 mg total) by mouth every evening. (Patient taking differently: Take 80 mg by mouth every evening. 1/2 tab daily) 90 tablet 2   clopidogrel  (PLAVIX ) 75 MG tablet Take 1 tablet (75 mg total) by mouth daily. 30 tablet 2   Glycopyrrolate-Formoterol (BEVESPI  AEROSPHERE) 9-4.8 MCG/ACT AERO Inhale 2 puffs into the lungs 2 (two) times daily. 10.7 g 5   metoprolol  tartrate (LOPRESSOR ) 25 MG tablet Take 1 tablet (25 mg total) by mouth 2 (two) times daily. 60 tablet 1   pantoprazole  (PROTONIX ) 40 MG tablet TAKE 1 TABLET(40 MG) BY MOUTH TWICE DAILY BEFORE A MEAL (Patient taking differently: Take 40 mg by mouth daily.) 180 tablet 2   valsartan  (DIOVAN ) 160 MG tablet TAKE 1 TABLET(160 MG) BY MOUTH DAILY 90 tablet 3   No current facility-administered medications on file prior to visit.    Allergies  Allergen Reactions   Codeine Nausea And Vomiting   Pravastatin Other (See Comments)    Reaction:  Chest pain     Past Medical History:  Diagnosis Date   AVM (arteriovenous malformation) of ascending colon  12/25/2012   Cancer (HCC)    Prostate   Coronary atherosclerosis of native coronary artery 05/26/2015   GERD (gastroesophageal reflux disease)    aspirin  sensitive, prn meds only   Hyperlipidemia    Hypertension    Lumbar disc disease    Myocardial infarction [facility])    Nephrolithiasis    Personal history of colonic adenoma 12/30/2012   Ureteral duplication, right     Past Surgical History:  Procedure Laterality Date   ANGIOPLASTY     thigh   CARDIAC CATHETERIZATION N/A 07/05/2014   Procedure: Left Heart Cath and Coronary Angiography;  Surgeon: Percival Brace, MD;  Location: [facility] INVASIVE CV LAB;  Service: Cardiovascular;  Laterality: N/A;   DOPPLER ECHOCARDIOGRAPHY  06/24/07   stress echo normal. Nl baseline EF   HAND NERVE REPAIR  1970's   both hands   KNEE ARTHROSCOPY  1990's   right    LUMBAR DISC SURGERY  1993   PROSTATE SURGERY  05/2017   robotic prostatectomy   Right leg compound fracture repair  1976    Family History  Problem Relation Age of Onset   Cancer Mother        colon cancer   Colon cancer Mother 17   Throat cancer Mother    Esophageal cancer Mother    Dementia Mother    Heart disease Father  Hypertension Father    Coronary artery disease Father    Valvular heart disease Father    Diabetes Neg Hx    Prostate cancer Neg Hx    Kidney cancer Neg Hx    Rectal cancer Neg Hx    Stomach cancer Neg Hx    Lung disease Neg Hx     Social History   Socioeconomic History   Marital status: Married    Spouse name: Not on file   Number of children: 0   Years of education: Not on file   Highest education level: Bachelor's degree (e.g., BA, AB, BS)  Occupational History   Occupation: Scientist, research (life sciences): [employer]    Comment: Retired  Tobacco Use   Smoking status: Every Day    Current packs/day: 1.00    Average packs/day: 1 pack/day for 64.4 years (64.4 ttl pk-yrs)    Types: Cigarettes    Start date: 02/26/1959    Passive exposure: Past  (as a child)   Smokeless tobacco: Never   Tobacco comments:    Less than 1/2 ppd.  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   [employer], have health care POA   Would accept resuscitation   No feeding tube if cognitively unaware   Social Drivers of Health   Financial Resource Strain: Low Risk  (07/14/2023)   Overall Financial Resource Strain (CARDIA)    Difficulty of Paying Living Expenses: Not hard at all  Food Insecurity: No Food Insecurity (07/14/2023)   Hunger Vital Sign    Worried About Running Out of Food in the Last Year: Never true    Ran Out of Food in the Last Year: Never true  Transportation Needs: No Transportation Needs (07/14/2023)   PRAPARE - Administrator, Civil Service (Medical): No    Lack of Transportation (Non-Medical): No  Physical Activity: Insufficiently Active (07/14/2023)   Exercise Vital Sign    Days of Exercise per Week: 1 day    Minutes of Exercise per Session: 20 min  Stress: No Stress Concern Present (07/14/2023)   [employer] of Occupational Health - Occupational Stress Questionnaire    Feeling of Stress : Not at all  Social Connections: Moderately Isolated (07/14/2023)   Social Connection and Isolation Panel [NHANES]    Frequency of Communication with Friends and Family: More than three times a week    Frequency of Social Gatherings with Friends and Family: Once a week    Attends Religious Services: Never    Database administrator or Organizations: No    Attends Engineer, structural: Not on file    Marital Status: Married  Intimate Partner Violence: Patient Declined (07/14/2023)   Humiliation, Afraid,  Rape, and Kick questionnaire    Fear of Current or Ex-Partner: Patient declined    Emotionally Abused: Patient declined    Physically Abused: Patient declined    Sexually Abused: Patient declined   Review of Systems Weight is up a few pounds--recent cruise (home a month ago) No sugarless candy or gum  Objective:   Physical Exam Constitutional:      Appearance: Normal appearance.  Pulmonary:     Effort: Pulmonary effort is normal.     Breath sounds: Normal breath sounds. No wheezing or rales.  Abdominal:     General: Bowel sounds are normal. There is no distension.     Palpations: Abdomen is soft.     Tenderness: There is no abdominal tenderness. There is no guarding or rebound.  Musculoskeletal:     Cervical back: Neck supple.  Lymphadenopathy:     Cervical: No cervical adenopathy.  Neurological:     Mental Status: He is alert.            Assessment & Plan:

## 2023-09-10 DIAGNOSIS — D2262 Melanocytic nevi of left upper limb, including shoulder: Secondary | ICD-10-CM | POA: Diagnosis not present

## 2023-09-10 DIAGNOSIS — D225 Melanocytic nevi of trunk: Secondary | ICD-10-CM | POA: Diagnosis not present

## 2023-09-10 DIAGNOSIS — D1801 Hemangioma of skin and subcutaneous tissue: Secondary | ICD-10-CM | POA: Diagnosis not present

## 2023-09-10 DIAGNOSIS — D224 Melanocytic nevi of scalp and neck: Secondary | ICD-10-CM | POA: Diagnosis not present

## 2023-09-10 DIAGNOSIS — D692 Other nonthrombocytopenic purpura: Secondary | ICD-10-CM | POA: Diagnosis not present

## 2023-09-10 DIAGNOSIS — L821 Other seborrheic keratosis: Secondary | ICD-10-CM | POA: Diagnosis not present

## 2023-09-10 DIAGNOSIS — Z85828 Personal history of other malignant neoplasm of skin: Secondary | ICD-10-CM | POA: Diagnosis not present

## 2023-09-10 DIAGNOSIS — L570 Actinic keratosis: Secondary | ICD-10-CM | POA: Diagnosis not present

## 2023-09-10 DIAGNOSIS — D2272 Melanocytic nevi of left lower limb, including hip: Secondary | ICD-10-CM | POA: Diagnosis not present

## 2023-10-08 ENCOUNTER — Encounter: Payer: Self-pay | Admitting: Internal Medicine

## 2023-10-08 ENCOUNTER — Ambulatory Visit: Admitting: Internal Medicine

## 2023-10-08 VITALS — BP 138/88 | HR 64 | Temp 97.9°F | Ht 70.5 in | Wt 186.0 lb

## 2023-10-08 DIAGNOSIS — Z0000 Encounter for general adult medical examination without abnormal findings: Secondary | ICD-10-CM | POA: Diagnosis not present

## 2023-10-08 DIAGNOSIS — Z8546 Personal history of malignant neoplasm of prostate: Secondary | ICD-10-CM | POA: Diagnosis not present

## 2023-10-08 DIAGNOSIS — I10 Essential (primary) hypertension: Secondary | ICD-10-CM | POA: Diagnosis not present

## 2023-10-08 DIAGNOSIS — I2510 Atherosclerotic heart disease of native coronary artery without angina pectoris: Secondary | ICD-10-CM | POA: Diagnosis not present

## 2023-10-08 DIAGNOSIS — J449 Chronic obstructive pulmonary disease, unspecified: Secondary | ICD-10-CM

## 2023-10-08 LAB — COMPREHENSIVE METABOLIC PANEL WITH GFR
ALT: 13 U/L (ref 0–53)
AST: 13 U/L (ref 0–37)
Albumin: 4.1 g/dL (ref 3.5–5.2)
Alkaline Phosphatase: 82 U/L (ref 39–117)
BUN: 14 mg/dL (ref 6–23)
CO2: 28 meq/L (ref 19–32)
Calcium: 9 mg/dL (ref 8.4–10.5)
Chloride: 104 meq/L (ref 96–112)
Creatinine, Ser: 1.04 mg/dL (ref 0.40–1.50)
GFR: 73.98 mL/min (ref >60.00)
Glucose, Bld: 86 mg/dL (ref 70–99)
Potassium: 4.6 meq/L (ref 3.5–5.1)
Sodium: 140 meq/L (ref 135–145)
Total Bilirubin: 0.6 mg/dL (ref 0.2–1.2)
Total Protein: 6.6 g/dL (ref 6.0–8.3)

## 2023-10-08 LAB — LIPID PANEL
Cholesterol: 126 mg/dL (ref 0–200)
HDL: 40.5 mg/dL (ref >39.00)
LDL Cholesterol: 71 mg/dL (ref 0–99)
NonHDL: 85.21
Total CHOL/HDL Ratio: 3
Triglycerides: 69 mg/dL (ref 0.0–149.0)
VLDL: 13.8 mg/dL (ref 0.0–40.0)

## 2023-10-08 LAB — CBC
HCT: 45.3 % (ref 39.0–52.0)
Hemoglobin: 14.9 g/dL (ref 13.0–17.0)
MCHC: 32.8 g/dL (ref 30.0–36.0)
MCV: 88.9 fl (ref 78.0–100.0)
Platelets: 234 K/uL (ref 150.0–400.0)
RBC: 5.1 Mil/uL (ref 4.22–5.81)
RDW: 14.1 % (ref 11.5–15.5)
WBC: 9.2 K/uL (ref 4.0–10.5)

## 2023-10-08 LAB — PSA: PSA: 0 ng/mL — ABNORMAL LOW (ref 0.10–4.00)

## 2023-10-08 NOTE — Assessment & Plan Note (Signed)
 No recent angina On ASA/plavix 

## 2023-10-08 NOTE — Assessment & Plan Note (Signed)
 BP Readings from Last 3 Encounters:  10/08/23 138/88  07/15/23 132/84  07/14/23 130/82   Valsartan  160 and metoprolol  25 bid

## 2023-10-08 NOTE — Assessment & Plan Note (Signed)
 Healthy Needs to work on fitness Flu/COVID vaccines in the fall Due for colon--checking with Dr Avram about this

## 2023-10-08 NOTE — Assessment & Plan Note (Signed)
 Will check PSA.

## 2023-10-08 NOTE — Progress Notes (Signed)
 Subjective:    Patient ID: Justin Terrell, male    DOB: [birth year], 68 y.o.   MRN: [id number]  HPI Here for physical  Doing okay Knows he is aging---easier skin tears, aches and pains, etc Walks some, does yard work Still smoking---under 1 PPD Breathing is okay on inhaler--has needed the albuterol  only rarely Some cough with phlegm--mostly in AM  Vision changes are almost resolved Still has abnormal area in left visual field  No chest pain Slight sense of skipping heart recently--not definite No edema No dizziness or syncope Occasional headache  Acid reflux is controlled Protonix  now daily No dysphagia  Current Outpatient Medications on File Prior to Visit  Medication Sig Dispense Refill   albuterol  (VENTOLIN  HFA) 108 (90 Base) MCG/ACT inhaler Inhale 2 puffs into the lungs every 6 (six) hours as needed. 18 g 5   aspirin  EC 81 MG tablet Take 81 mg by mouth daily.     clopidogrel  (PLAVIX ) 75 MG tablet Take 1 tablet (75 mg total) by mouth daily. 30 tablet 2   Glycopyrrolate-Formoterol (BEVESPI  AEROSPHERE) 9-4.8 MCG/ACT AERO Inhale 2 puffs into the lungs 2 (two) times daily. 10.7 g 5   metoprolol  tartrate (LOPRESSOR ) 25 MG tablet Take 1 tablet (25 mg total) by mouth 2 (two) times daily. 60 tablet 1   pantoprazole  (PROTONIX ) 40 MG tablet TAKE 1 TABLET(40 MG) BY MOUTH TWICE DAILY BEFORE A MEAL (Patient taking differently: Take 40 mg by mouth daily.) 180 tablet 2   rosuvastatin  (CRESTOR ) 10 MG tablet Take 1 tablet (10 mg total) by mouth daily. 90 tablet 3   valsartan  (DIOVAN ) 160 MG tablet TAKE 1 TABLET(160 MG) BY MOUTH DAILY 90 tablet 3   No current facility-administered medications on file prior to visit.    Allergies  Allergen Reactions   Codeine Nausea And Vomiting   Pravastatin Other (See Comments)    Reaction:  Chest pain     Past Medical History:  Diagnosis Date   AVM (arteriovenous malformation) of ascending colon 12/25/2012   Cancer (HCC)    Prostate   Coronary  atherosclerosis of native coronary artery 05/26/2015   GERD (gastroesophageal reflux disease)    aspirin  sensitive, prn meds only   Hyperlipidemia    Hypertension    Lumbar disc disease    Myocardial infarction [facility])    Nephrolithiasis    Personal history of colonic adenoma 12/30/2012   Ureteral duplication, right     Past Surgical History:  Procedure Laterality Date   ANGIOPLASTY     thigh   CARDIAC CATHETERIZATION N/A 07/05/2014   Procedure: Left Heart Cath and Coronary Angiography;  Surgeon: Marsa Dooms, MD;  Location: [facility] INVASIVE CV LAB;  Service: Cardiovascular;  Laterality: N/A;   DOPPLER ECHOCARDIOGRAPHY  06/24/07   stress echo normal. Nl baseline EF   HAND NERVE REPAIR  1970's   both hands   KNEE ARTHROSCOPY  1990's   right    LUMBAR DISC SURGERY  1993   PROSTATE SURGERY  05/2017   robotic prostatectomy   Right leg compound fracture repair  1976    Family History  Problem Relation Age of Onset   Cancer Mother        colon cancer   Colon cancer Mother 63   Throat cancer Mother    Esophageal cancer Mother    Dementia Mother    Heart disease Father    Hypertension Father    Coronary artery disease Father    Valvular heart disease Father  Diabetes Neg Hx    Prostate cancer Neg Hx    Kidney cancer Neg Hx    Rectal cancer Neg Hx    Stomach cancer Neg Hx    Lung disease Neg Hx     Social History   Socioeconomic History   Marital status: Married    Spouse name: Not on file   Number of children: 0   Years of education: Not on file   Highest education level: Bachelor's degree (e.g., BA, AB, BS)  Occupational History   Occupation: Scientist, research (life sciences): [employer]    Comment: Retired  Tobacco Use   Smoking status: Every Day    Current packs/day: 1.00    Average packs/day: 1 pack/day for 64.6 years (64.6 ttl pk-yrs)    Types: Cigarettes    Start date: 02/26/1959    Passive exposure: Past (as a child)   Smokeless tobacco: Never   Tobacco  comments:    Less than 1/2 ppd.  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   [employer], have health care POA   Would accept resuscitation   No feeding tube if cognitively unaware   Social Drivers of Health   Financial Resource Strain: Low Risk  (07/14/2023)   Overall Financial Resource Strain (CARDIA)    Difficulty of Paying Living Expenses: Not hard at all  Food Insecurity: No Food Insecurity (07/14/2023)   Hunger Vital Sign    Worried About Running Out of Food in the Last Year: Never true    Ran Out of Food in the Last Year: Never true  Transportation Needs: No Transportation Needs (07/14/2023)   PRAPARE - Administrator, Civil Service (Medical): No    Lack of Transportation (Non-Medical): No  Physical Activity: Insufficiently Active (07/14/2023)   Exercise Vital Sign    Days of Exercise per Week: 1 day    Minutes of Exercise per Session: 20 min  Stress: No Stress Concern Present (07/14/2023)   [employer] of Occupational Health - Occupational Stress Questionnaire    Feeling of Stress : Not at all  Social Connections: Moderately Isolated (07/14/2023)   Social Connection and Isolation Panel    Frequency of Communication with Friends and Family: More than three times a week    Frequency of Social Gatherings with Friends and Family: Once a week    Attends Religious Services: Never    Database administrator or Organizations: No    Attends Engineer, structural: Not on file    Marital Status: Married  Intimate Partner Violence: Patient Declined (07/14/2023)   Humiliation, Afraid, Rape, and Kick questionnaire    Fear of Current or  Ex-Partner: Patient declined    Emotionally Abused: Patient declined    Physically Abused: Patient declined    Sexually Abused: Patient declined   Review of Systems  Constitutional:  Negative for fatigue and unexpected weight change.       Wears seat belt  HENT:  Positive for tinnitus. Negative for dental problem and hearing loss.   Eyes:  Negative  for visual disturbance.       No diplopia or unilateral vision loss  Respiratory:  Positive for cough. Negative for chest tightness and shortness of breath.   Cardiovascular:  Negative for chest pain, palpitations and leg swelling.  Gastrointestinal:  Negative for blood in stool and constipation.  Endocrine: Negative for polydipsia and polyuria.  Genitourinary:  Negative for urgency.       Mild slow stream---empties okay No sexual problems  Musculoskeletal:  Positive for arthralgias and back pain. Negative for joint swelling.       No meds  Skin:  Negative for rash.  Allergic/Immunologic: Negative for environmental allergies and immunocompromised state.  Neurological:  Negative for dizziness, syncope and light-headedness.  Hematological:  Negative for adenopathy. Bruises/bleeds easily.  Psychiatric/Behavioral:  Negative for dysphoric mood. The patient is not nervous/anxious.        Has early morning awakening---is able to get back to sleep Some daytime somnolence---nap will help (30 minutes)        Objective:   Physical Exam Constitutional:      Appearance: Normal appearance.  HENT:     Mouth/Throat:     Pharynx: No oropharyngeal exudate or posterior oropharyngeal erythema.  Eyes:     Conjunctiva/sclera: Conjunctivae normal.     Pupils: Pupils are equal, round, and reactive to light.  Cardiovascular:     Rate and Rhythm: Normal rate and regular rhythm.     Heart sounds: No murmur heard.    No gallop.     Comments: Faint pedal pulses Pulmonary:     Effort: Pulmonary effort is normal.     Breath sounds: No wheezing or rales.      Comments: Decreased breath sounds but clear Abdominal:     Palpations: Abdomen is soft.     Tenderness: There is no abdominal tenderness.  Musculoskeletal:     Cervical back: Neck supple.     Right lower leg: No edema.     Left lower leg: No edema.  Lymphadenopathy:     Cervical: No cervical adenopathy.  Skin:    Findings: No lesion or rash.  Neurological:     General: No focal deficit present.     Mental Status: He is alert and oriented to person, place, and time.  Psychiatric:        Mood and Affect: Mood normal.        Behavior: Behavior normal.            Assessment & Plan:

## 2023-10-08 NOTE — Assessment & Plan Note (Signed)
 Urged him to stop smoking Yearly CT scans On bevespi 

## 2023-10-09 ENCOUNTER — Ambulatory Visit: Payer: Self-pay | Admitting: Internal Medicine

## 2023-10-09 NOTE — Progress Notes (Signed)
 I have spoken with Justin Terrell and informed him of the recall change and he verbalized understanding.

## 2023-11-10 ENCOUNTER — Other Ambulatory Visit: Payer: Self-pay | Admitting: Family

## 2023-11-10 ENCOUNTER — Telehealth: Payer: Self-pay

## 2023-11-10 MED ORDER — COVID-19 MRNA VAC-TRIS(PFIZER) 30 MCG/0.3ML IM SUSY: 0.3000 mL | PREFILLED_SYRINGE | Freq: Once | INTRAMUSCULAR | 0 refills | Status: AC

## 2023-11-10 NOTE — Telephone Encounter (Signed)
 Copied from CRM [id number]. Topic: Clinical - Medication Question >> Nov 10, 2023  8:37 AM Charlet HERO wrote: Reason for CRM: Patient is calling to have script sent in to the [facility] for covid vaccine please contact patient to let know when it has been sent to pharmacy.    Ok to send as pended

## 2023-11-10 NOTE — Telephone Encounter (Unsigned)
 Copied from CRM [id number]. Topic: Clinical - Medication Question >> Nov 10, 2023  8:37 AM Charlet HERO wrote: Reason for CRM: Patient is calling to have script sent in to the [facility] for covid vaccine please contact patient to let know when it has been sent to pharmacy.

## 2023-11-21 ENCOUNTER — Ambulatory Visit
Admission: RE | Admit: 2023-11-21 | Discharge: 2023-11-21 | Disposition: A | Discharge status: Home / Self Care | Source: Ambulatory Visit | Attending: Acute Care | Admitting: Acute Care

## 2023-11-21 DIAGNOSIS — Z87891 Personal history of nicotine dependence: Secondary | ICD-10-CM | POA: Diagnosis not present

## 2023-11-21 DIAGNOSIS — Z122 Encounter for screening for malignant neoplasm of respiratory organs: Secondary | ICD-10-CM | POA: Insufficient documentation

## 2023-11-21 DIAGNOSIS — F17210 Nicotine dependence, cigarettes, uncomplicated: Secondary | ICD-10-CM | POA: Insufficient documentation

## 2023-12-01 ENCOUNTER — Other Ambulatory Visit: Payer: Self-pay | Admitting: Acute Care

## 2023-12-01 DIAGNOSIS — Z87891 Personal history of nicotine dependence: Secondary | ICD-10-CM

## 2023-12-01 DIAGNOSIS — F17210 Nicotine dependence, cigarettes, uncomplicated: Secondary | ICD-10-CM

## 2023-12-01 DIAGNOSIS — Z122 Encounter for screening for malignant neoplasm of respiratory organs: Secondary | ICD-10-CM

## 2023-12-13 ENCOUNTER — Other Ambulatory Visit: Payer: Self-pay | Admitting: Internal Medicine

## 2023-12-13 DIAGNOSIS — J439 Emphysema, unspecified: Secondary | ICD-10-CM

## 2024-01-20 ENCOUNTER — Telehealth: Payer: Self-pay

## 2024-01-20 NOTE — Telephone Encounter (Signed)
 Copied from CRM [id number]. Topic: Appointments - Transfer of Care >> Jan 20, 2024  1:40 PM Berneda FALCON wrote: Pt is requesting to transfer FROM: [place] Pt is requesting to transfer TO: [place] Reason for requested transfer: PCP retired It is the responsibility of the team the patient would like to transfer to (Dr. Bennett) to reach out to the patient if for any reason this transfer is not acceptable.

## 2024-01-28 ENCOUNTER — Encounter: Payer: Self-pay | Admitting: Internal Medicine

## 2024-01-28 ENCOUNTER — Ambulatory Visit: Admitting: Internal Medicine

## 2024-01-28 VITALS — BP 126/64 | HR 67 | Temp 97.8°F | Ht 72.0 in | Wt 183.0 lb

## 2024-01-28 DIAGNOSIS — F17200 Nicotine dependence, unspecified, uncomplicated: Secondary | ICD-10-CM

## 2024-01-28 DIAGNOSIS — F17210 Nicotine dependence, cigarettes, uncomplicated: Secondary | ICD-10-CM

## 2024-01-28 DIAGNOSIS — Z122 Encounter for screening for malignant neoplasm of respiratory organs: Secondary | ICD-10-CM | POA: Diagnosis not present

## 2024-01-28 DIAGNOSIS — J439 Emphysema, unspecified: Secondary | ICD-10-CM

## 2024-01-28 DIAGNOSIS — J4489 Other specified chronic obstructive pulmonary disease: Secondary | ICD-10-CM

## 2024-01-28 MED ORDER — BEVESPI AEROSPHERE 9-4.8 MCG/ACT IN AERO: 2.0000 | INHALATION_SPRAY | Freq: Two times a day (BID) | RESPIRATORY_TRACT | 11 refills | Status: DC

## 2024-01-28 MED ORDER — ALBUTEROL SULFATE HFA 108 (90 BASE) MCG/ACT IN AERS: 2.0000 | INHALATION_SPRAY | Freq: Four times a day (QID) | RESPIRATORY_TRACT | 3 refills | Status: AC | PRN

## 2024-01-28 NOTE — Patient Instructions (Addendum)
 It was a pleasure to see you today!  Please schedule follow up with Dr. Jude in 12 months. Please call sooner [phone] if issues or concerns arise. You can also send us  a message through MyChart, but but aware that this is not to be used for urgent issues and it may take up to 5-7 days to receive a reply. Please be aware that you will likely be able to view your results before I have a chance to respond to them. Please give us  5 business days to respond to any non-urgent results.   You are next due for a CT scan for lung cancer screening in October 2026.   Continue bevespi  inhaler 2 puffs twice daily, gargle after use.   Take the albuterol  rescue inhaler every 4 to 6 hours as needed for wheezing or shortness of breath. You can also take it 15 minutes before exercise or exertional activity. Side effects include heart racing or pounding, jitters or anxiety. If you have a history of an irregular heart rhythm, it can make this worse. Can also give some patients a hard time sleeping.

## 2024-01-28 NOTE — Progress Notes (Signed)
 MYERS TUTTEROW    [id number]    09/25/1955  Primary Care Physician:[place], Charlie FERNS, MD Date of Appointment: 01/28/2024 Established Patient Visit  Chief complaint:   Chief Complaint  Patient presents with   COPD    Wheezing and prod cough(unsure of color)      HPI: Justin Terrell is a 68 y.o. man with ongoing tobacco use disorder and severe COPD FEV1 42% of predicted.  Interval Updates: Here for COPD follow up.   Current threrapy: bevespi  2 puffs BID, prn albuterol . Taking 2 puffs in the morning, 1 puff at night. He feels he isn't having major breathing issues and isn't sure how much it's helping. Uses albuterol  inhaler minimally 4-5 times since last visit.   Wife says he has some wheezing, shortness of breath, productive cough. Usually wheeze is with laying down.   Denies any exacerbations, hospitalizations.   He just got back from a cruise to [place], [place]. He did have difficulty walking due to knee and ankle pain and was not limited by dyspnea.   Smokes about 1 ppd still.   I have reviewed the patient's family social and past medical history and updated as appropriate.   Past Medical History:  Diagnosis Date   AVM (arteriovenous malformation) of ascending colon 12/25/2012   Cancer (HCC)    Prostate   Coronary atherosclerosis of native coronary artery 05/26/2015   GERD (gastroesophageal reflux disease)    aspirin  sensitive, prn meds only   Hyperlipidemia    Hypertension    Lumbar disc disease    Myocardial infarction [facility])    Nephrolithiasis    Personal history of colonic adenoma 12/30/2012   Ureteral duplication, right     Past Surgical History:  Procedure Laterality Date   ANGIOPLASTY     thigh   CARDIAC CATHETERIZATION N/A 07/05/2014   Procedure: Left Heart Cath and Coronary Angiography;  Surgeon: Marsa Dooms, MD;  Location: [facility] INVASIVE CV LAB;  Service: Cardiovascular;  Laterality: N/A;   DOPPLER ECHOCARDIOGRAPHY  06/24/07   stress  echo normal. Nl baseline EF   HAND NERVE REPAIR  1970's   both hands   KNEE ARTHROSCOPY  1990's   right    LUMBAR DISC SURGERY  1993   PROSTATE SURGERY  05/2017   robotic prostatectomy   Right leg compound fracture repair  1976    Family History  Problem Relation Age of Onset   Cancer Mother        colon cancer   Colon cancer Mother 47   Throat cancer Mother    Esophageal cancer Mother    Dementia Mother    Heart disease Father    Hypertension Father    Coronary artery disease Father    Valvular heart disease Father    Diabetes Neg Hx    Prostate cancer Neg Hx    Kidney cancer Neg Hx    Rectal cancer Neg Hx    Stomach cancer Neg Hx    Lung disease Neg Hx     Social History   Occupational History   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: [employer]    Comment: Retired  Tobacco Use   Smoking status: Every Day    Current packs/day: 1.00    Average packs/day: 1 pack/day for 64.9 years (64.9 ttl pk-yrs)    Types: Cigarettes    Start date: 02/26/1959    Passive exposure: Past (as a child)   Smokeless tobacco: Never  Tobacco comments:    1PPD 01/28/2024  Vaping Use   Vaping status: Never Used  Substance and Sexual Activity   Alcohol use: No    Alcohol/week: 0.0 standard drinks of alcohol   Drug use: No   Sexual activity: Not on file     Physical Exam: Blood pressure 126/64, pulse 67, temperature 97.8 F (36.6 C), temperature source Oral, height 6' (1.829 m), weight 183 lb (83 kg), SpO2 95%.  Gen:     No distress Lungs:   ctab no wheeze CV:         RRR   Data Reviewed: Imaging: I have personally reviewed the chest xray July 2024 - shows hyperinflation  PFTs:     Latest Ref Rng & Units 11/21/2022    9:30 AM  PFT Results  FVC-Pre L 2.85   FVC-Predicted Pre % 58   FVC-Post L 3.14   FVC-Predicted Post % 64   Pre FEV1/FVC % % 55   Post FEV1/FCV % % 54   FEV1-Pre L 1.56   FEV1-Predicted Pre % 42   FEV1-Post L 1.70   DLCO uncorrected ml/min/mmHg  12.68   DLCO UNC% % 45   DLVA Predicted % 53   TLC L 8.49   TLC % Predicted % 114   RV % Predicted % 212    I have personally reviewed the patient's PFTs which shows severe airflow limitation with +BD response and reduced dlco.   Labs: Lab Results  Component Value Date   NA 140 10/08/2023   K 4.6 10/08/2023   CO2 28 10/08/2023   GLUCOSE 86 10/08/2023   BUN 14 10/08/2023   CREATININE 1.04 10/08/2023   CALCIUM  9.0 10/08/2023   GFR 73.98 10/08/2023   EGFR 83 07/24/2021   GFRNONAA >60 09/18/2022   Lab Results  Component Value Date   WBC 9.2 10/08/2023   HGB 14.9 10/08/2023   HCT 45.3 10/08/2023   MCV 88.9 10/08/2023   PLT 234.0 10/08/2023     Immunization status: Immunization History  Administered Date(s) Administered   Fluad Quad(high Dose 65+) 11/07/2020   Influenza, Seasonal, Injecte, Preservative Fre 12/18/2014   Influenza,inj,Quad PF,6+ Mos 11/07/2018   Influenza-Unspecified 12/14/2017, 11/26/2019, 10/29/2022, 11/10/2023   Moderna Sars-Covid-2 Vaccination 10/29/2022   PFIZER(Purple Top)SARS-COV-2 Vaccination 04/04/2019, 04/28/2019, 10/12/2019, 05/14/2020   Pneumococcal Conjugate-13 01/10/2018   Pneumococcal Polysaccharide-23 07/24/2021   RSV,unspecified 10/29/2022   Td 07/24/2021   Tdap 10/10/2011   Zoster Recombinant(Shingrix) 01/15/2020, 03/16/2020    External Records Personally Reviewed: cardiology, hospital stay.   Assessment:  COPD, Severe FEV1 42% of predicted 50 pack year smoking history, every day use S/p CVA in July 2024 Lung cancer screening   Plan/Recommendations: You are next due for a CT scan for lung cancer screening in October 2026.   Continue bevespi  inhaler 2 puffs twice daily, gargle after use.   Take the albuterol  rescue inhaler every 4 to 6 hours as needed for wheezing or shortness of breath. You can also take it 15 minutes before exercise or exertional activity. Side effects include heart racing or pounding, jitters or anxiety.  If you have a history of an irregular heart rhythm, it can make this worse. Can also give some patients a hard time sleeping.  Pre-contemplative for smoking cessation. Says he lacks internal motivation. Wife had lung cancer and lobe resection and that didn't motivate him. Hypnosis not helpful.   Smoking Cessation Counseling:  1. The patient is an everyday smoker and symptomatic due to the following  condition emphysema 2. The patient is currently pre-contemplative in quitting smoking. 3. I advised patient to quit smoking. 4. We identified patient specific barriers to change.  5. I personally spent 5  10 minutes counseling the patient regarding tobacco use disorder. 6. We discussed management of stress and anxiety to help with smoking cessation, when applicable. 7. We discussed nicotine  replacement therapy, Wellbutrin, Chantix as possible options. 8. I advised setting a quit date. 9. Follow?up arranged with our office to continue ongoing discussions. 10.Resources given to patient including quit hotline.    Return to Care: Return in about 1 year (around 01/27/2025).   Verdon Gore, MD Pulmonary and Critical Care Medicine [facility] Office:[phone]

## 2024-02-13 ENCOUNTER — Other Ambulatory Visit: Payer: Self-pay | Admitting: *Deleted

## 2024-02-13 DIAGNOSIS — K219 Gastro-esophageal reflux disease without esophagitis: Secondary | ICD-10-CM

## 2024-02-13 MED ORDER — VALSARTAN 160 MG PO TABS: 160.0000 mg | ORAL_TABLET | Freq: Every day | ORAL | 0 refills | Status: AC

## 2024-02-13 MED ORDER — PANTOPRAZOLE SODIUM 40 MG PO TBEC: 40.0000 mg | DELAYED_RELEASE_TABLET | Freq: Two times a day (BID) | ORAL | 0 refills | Status: DC

## 2024-03-16 ENCOUNTER — Telehealth (HOSPITAL_BASED_OUTPATIENT_CLINIC_OR_DEPARTMENT_OTHER): Payer: Self-pay

## 2024-03-16 NOTE — Telephone Encounter (Unsigned)
 Copied from CRM [id number]. Topic: Clinical - Prescription Issue >> Mar 16, 2024  2:36 PM Joesph PARAS wrote: Reason for CRM: Patient's spouse calling to state that BEVESPI  is now over $400 and not covered by insurance. Patient's spouse would like to inquire about a covered alternative through insurance for patient, as $400 is not feasible. Insurance states patient will need a generic inhaler.  Patient is requesting Dr. Alva handle this if at all possible. Patient's spouse requesting update at phone number on file.

## 2024-03-19 ENCOUNTER — Other Ambulatory Visit (HOSPITAL_COMMUNITY): Payer: Self-pay

## 2024-03-19 MED ORDER — UMECLIDINIUM-VILANTEROL 62.5-25 MCG/ACT IN AEPB: 1.0000 | INHALATION_SPRAY | Freq: Every day | RESPIRATORY_TRACT | 5 refills | Status: DC

## 2024-03-19 NOTE — Telephone Encounter (Signed)
 Any advice for new inhaler that's not as expensive

## 2024-03-19 NOTE — Telephone Encounter (Signed)
 Anoro sent and pt notified

## 2024-03-23 ENCOUNTER — Telehealth: Payer: Self-pay

## 2024-03-23 ENCOUNTER — Other Ambulatory Visit: Payer: Self-pay

## 2024-03-23 ENCOUNTER — Ambulatory Visit (INDEPENDENT_AMBULATORY_CARE_PROVIDER_SITE_OTHER)
Admission: RE | Admit: 2024-03-23 | Discharge: 2024-03-23 | Disposition: A | Discharge status: Home / Self Care | Source: Ambulatory Visit

## 2024-03-23 ENCOUNTER — Ambulatory Visit

## 2024-03-23 VITALS — BP 128/70 | HR 74 | Temp 97.4°F | Ht 70.5 in | Wt 178.0 lb

## 2024-03-23 DIAGNOSIS — R051 Acute cough: Secondary | ICD-10-CM

## 2024-03-23 DIAGNOSIS — K219 Gastro-esophageal reflux disease without esophagitis: Secondary | ICD-10-CM

## 2024-03-23 DIAGNOSIS — J441 Chronic obstructive pulmonary disease with (acute) exacerbation: Secondary | ICD-10-CM

## 2024-03-23 LAB — POC COVID19 BINAXNOW: SARS Coronavirus 2 Ag: NEGATIVE

## 2024-03-23 MED ORDER — UMECLIDINIUM-VILANTEROL 62.5-25 MCG/ACT IN AEPB: 1.0000 | INHALATION_SPRAY | Freq: Every day | RESPIRATORY_TRACT | 6 refills | Status: AC

## 2024-03-23 MED ORDER — FLUTICASONE PROPIONATE HFA 110 MCG/ACT IN AERO: 1.0000 | INHALATION_SPRAY | Freq: Two times a day (BID) | RESPIRATORY_TRACT | 12 refills | Status: DC

## 2024-03-23 MED ORDER — PREDNISONE 20 MG PO TABS: 40.0000 mg | ORAL_TABLET | Freq: Every day | ORAL | 0 refills | Status: DC

## 2024-03-23 MED ORDER — AMOXICILLIN-POT CLAVULANATE 875-125 MG PO TABS: 1.0000 | ORAL_TABLET | Freq: Two times a day (BID) | ORAL | 0 refills | Status: AC

## 2024-03-23 MED ORDER — PANTOPRAZOLE SODIUM 40 MG PO TBEC: 40.0000 mg | DELAYED_RELEASE_TABLET | Freq: Every day | ORAL | Status: DC

## 2024-03-23 MED ORDER — GUAIFENESIN ER 600 MG PO TB12: 600.0000 mg | ORAL_TABLET | Freq: Two times a day (BID) | ORAL | 0 refills | Status: AC

## 2024-03-23 NOTE — Telephone Encounter (Signed)
 Changing therapy due to lack of insurance coverage. Anoro 1 puff daily.

## 2024-03-23 NOTE — Assessment & Plan Note (Signed)
 Acute cough in the setting of recent URI, COVID-negative, concern for COPD exacerbation, will treat accordingly as below, ordering chest x-ray to rule out pneumonia, only giving prednisone  for 3-day course rather than 5-day course given recent use of systemic steroids.  Counseled to continue his regular inhalers as well as albuterol  inhaler every 6 hours scheduled for the next 3 days.  Mucinex  for symptomatic relief of congestion and cough.  -Continue Anoro Ellipta  1 puff daily - Albuterol  2 puffs every 6 hours  Orders:   POC COVID-19   DG Chest 2 View; Future   amoxicillin -clavulanate (AUGMENTIN ) 875-125 MG tablet; Take 1 tablet by mouth 2 (two) times daily for 5 days.   guaiFENesin  (MUCINEX ) 600 MG 12 hr tablet; Take 1 tablet (600 mg total) by mouth 2 (two) times daily for 10 days.   predniSONE  (DELTASONE ) 20 MG tablet; Take 2 tablets (40 mg total) by mouth daily with breakfast for 3 days.

## 2024-03-23 NOTE — Progress Notes (Unsigned)
 "  Subjective:   This visit was conducted in person. The patient gave informed consent to the use of Abridge AI technology to record the contents of the encounter as documented below.   Patient ID: Justin Terrell, male    DOB: [birth year], 69 y.o.   MRN: [id number]   Discussed the use of AI scribe software for clinical note transcription with the patient, who gave verbal consent to proceed.  History of Present Illness Feeling sluggish and coughing x 2wks, worsening, nose running, body aches, 99.37F at home, some sorethroat,  SOB and cough are worse than usual, cough productive with chunks yellow n white Prednisone  taper 1wk long was given to him recently by Ortho, he finished it 3wks ago   Justin Terrell is a 69 year old male with COPD who presents with increased coughing and chest pain.  He has experienced increased coughing and chest pain over the past two weeks. The cough has become more productive, with yellow and white phlegm. He has no chest pain prior to the onset of the cough. He also feels sluggish and has body aches. He has been using his albuterol  inhaler more frequently over the past two days.  He has a history of COPD and reports chronic coughing, which has worsened recently. He uses a Bevespi  inhaler, two puffs in the morning and one at night, and an albuterol  inhaler as needed. He mentions that part of his left lung has collapsed in the past, but recent imaging showed some airway collapse in the right lower lung.  He has a history of heart attack in 2016 and stroke in 2024. He takes several medications for his heart, including valsartan , Lopressor  (metoprolol ), clopidogrel , and aspirin .  He underwent prostatectomy in 2019 for prostate cancer and has a history of high cholesterol, for which he takes Crestor . He also takes Protonix  once daily for acid reflux and has a history of back surgery, with current shoulder pain being managed with tramadol  and Zanaflex.  He has a history of  allergies to pravastatin and codeine, noting that codeine and tramadol  cause him to wake up frequently at night. He smokes less than a pack of cigarettes a day and lives with his spouse, Verneita. He recently had to put down his dog, which has affected his living situation.  He reports a family history of colon cancer, esophageal cancer, and dementia in his mother.      Review of Systems  All other systems reviewed and are negative.       Allergies[1]  Medications Ordered Prior to Encounter[2]  BP 128/70 (BP Location: Right Arm, Patient Position: Sitting, Cuff Size: Large)   Pulse 74   Temp (!) 97.4 F (36.3 C) (Oral)   Ht 5' 10.5 (1.791 m)   Wt 178 lb (80.7 kg)   SpO2 96%   BMI 25.18 kg/m   Objective:   Physical Exam Vitals and nursing note reviewed.  Constitutional:      Appearance: Normal appearance.  HENT:     Head: Normocephalic and atraumatic.  Eyes:     Extraocular Movements: Extraocular movements intact.     Conjunctiva/sclera: Conjunctivae normal.  Cardiovascular:     Rate and Rhythm: Normal rate and regular rhythm.  Pulmonary:     Comments: Diminished breath sounds BL Mild wheezing BL Skin:    General: Skin is warm.  Neurological:     Mental Status: He is alert.  Psychiatric:        Mood and Affect: Mood  normal.        Behavior: Behavior normal.           Assessment & Plan:    Assessment & Plan COPD exacerbation (HCC) Acute cough Acute cough in the setting of recent URI, COVID-negative, concern for COPD exacerbation, will treat accordingly as below, ordering chest x-ray to rule out pneumonia, only giving prednisone  for 3-day course rather than 5-day course given recent use of systemic steroids.  Counseled to continue his regular inhalers as well as albuterol  inhaler every 6 hours scheduled for the next 3 days.  Mucinex  for symptomatic relief of congestion and cough.  -Continue Anoro Ellipta  1 puff daily - Albuterol  2 puffs every 6  hours  Orders:   POC COVID-19   DG Chest 2 View; Future   amoxicillin -clavulanate (AUGMENTIN ) 875-125 MG tablet; Take 1 tablet by mouth 2 (two) times daily for 5 days.   guaiFENesin  (MUCINEX ) 600 MG 12 hr tablet; Take 1 tablet (600 mg total) by mouth 2 (two) times daily for 10 days.   predniSONE  (DELTASONE ) 20 MG tablet; Take 2 tablets (40 mg total) by mouth daily with breakfast for 3 days.  Gastroesophageal reflux disease without esophagitis Symptoms well-controlled on Protonix , no changes. Orders:   pantoprazole  (PROTONIX ) 40 MG tablet; Take 1 tablet (40 mg total) by mouth daily. TAKE 1 TABLET(40 MG) BY MOUTH DAILY BEFORE A MEAL     No follow-ups on file.   Ichael Pullara K Hollace Michelli, MD  03/23/24     Contains text generated by Abridge.        [1]  Allergies Allergen Reactions   Codeine Nausea And Vomiting   Pravastatin Other (See Comments)    Reaction:  Chest pain   [2]  Current Outpatient Medications on File Prior to Visit  Medication Sig Dispense Refill   albuterol  (VENTOLIN  HFA) 108 (90 Base) MCG/ACT inhaler Inhale 2 puffs into the lungs every 6 (six) hours as needed. 18 g 3   aspirin  EC 81 MG tablet Take 81 mg by mouth daily.     clopidogrel  (PLAVIX ) 75 MG tablet Take 1 tablet (75 mg total) by mouth daily. 30 tablet 2   Glycopyrrolate-Formoterol (BEVESPI  AEROSPHERE) 9-4.8 MCG/ACT AERO Inhale 2 puffs into the lungs in the morning and at bedtime. 10.7 each 11   metoprolol  tartrate (LOPRESSOR ) 25 MG tablet Take 1 tablet (25 mg total) by mouth 2 (two) times daily. 60 tablet 1   pantoprazole  (PROTONIX ) 40 MG tablet Take 1 tablet (40 mg total) by mouth 2 (two) times daily before a meal. TAKE 1 TABLET(40 MG) BY MOUTH TWICE DAILY BEFORE A MEAL 180 tablet 0   rosuvastatin  (CRESTOR ) 10 MG tablet Take 1 tablet (10 mg total) by mouth daily. 90 tablet 3   tiZANidine (ZANAFLEX) 4 MG tablet Take 4 mg by mouth every 8 (eight) hours as needed.     traMADol  (ULTRAM ) 50 MG tablet Take 50 mg by  mouth 4 (four) times daily as needed.     valsartan  (DIOVAN ) 160 MG tablet Take 1 tablet (160 mg total) by mouth daily. 90 tablet 0   No current facility-administered medications on file prior to visit.   "

## 2024-03-23 NOTE — Assessment & Plan Note (Signed)
 Symptoms well-controlled on Protonix , no changes. Orders:   pantoprazole  (PROTONIX ) 40 MG tablet; Take 1 tablet (40 mg total) by mouth daily. TAKE 1 TABLET(40 MG) BY MOUTH DAILY BEFORE A MEAL

## 2024-03-23 NOTE — Telephone Encounter (Signed)
 Please call patient and update that his x-ray did not show any signs of pneumonia, he should continue with the Augmentin , prednisone  and Mucinex  for his COPD exacerbation.

## 2024-03-23 NOTE — Patient Instructions (Addendum)
 Thank you for visiting [facility] today! Here's what we talked about: - Start Augmentin  antibiotic and Mucinex  -START prednisone . Use albuterol  every 6 hours for the next 3 days

## 2024-03-24 NOTE — Telephone Encounter (Signed)
 Spoke to pt

## 2024-03-25 ENCOUNTER — Ambulatory Visit: Payer: Self-pay

## 2024-03-25 ENCOUNTER — Telehealth: Payer: Self-pay

## 2024-03-25 MED ORDER — PANTOPRAZOLE SODIUM 40 MG PO TBEC: 40.0000 mg | DELAYED_RELEASE_TABLET | Freq: Every day | ORAL | Status: AC

## 2024-03-25 MED ORDER — PREDNISONE 20 MG PO TABS: 40.0000 mg | ORAL_TABLET | Freq: Every day | ORAL | Status: AC

## 2024-03-25 NOTE — Telephone Encounter (Signed)
 SABRA

## 2024-03-29 ENCOUNTER — Other Ambulatory Visit (HOSPITAL_COMMUNITY): Payer: Self-pay

## 2024-03-29 ENCOUNTER — Telehealth: Payer: Self-pay

## 2024-03-29 NOTE — Telephone Encounter (Signed)
*  Pulm  Pharmacy Patient Advocate Encounter   Received notification from Fax that prior authorization for Bevespi  Aerosphere is required/requested.   Insurance verification completed.   The patient is insured through [facility].   Per test claim:  Anoro Ellipta , Stiolto Respimat is preferred by the insurance.  If suggested medication is appropriate, Please send in a new RX and discontinue this one. If not, please advise as to why it's not appropriate so that we may request a Prior Authorization. Please note, some preferred medications may still require a PA.  If the suggested medications have not been trialed and there are no contraindications to their use, the PA will not be submitted, as it will not be approved. Archived Key: [id number]  *Bevespi  may be covered once for a transitional fill until med is changed.

## 2024-03-30 ENCOUNTER — Other Ambulatory Visit (HOSPITAL_BASED_OUTPATIENT_CLINIC_OR_DEPARTMENT_OTHER): Payer: Self-pay

## 2024-03-30 ENCOUNTER — Other Ambulatory Visit (HOSPITAL_COMMUNITY): Payer: Self-pay

## 2024-05-04 ENCOUNTER — Ambulatory Visit

## 2024-07-05 ENCOUNTER — Ambulatory Visit

## 2024-07-15 ENCOUNTER — Ambulatory Visit
